# Patient Record
Sex: Female | Born: 1955 | Race: White | Hispanic: No | Marital: Married | Smoking: Current some day smoker
Health system: Southern US, Community
[De-identification: ages and names within clinical notes are randomized; demographics above are authoritative.]

## PROBLEM LIST (undated history)

## (undated) DIAGNOSIS — F32A Depression, unspecified: Secondary | ICD-10-CM

## (undated) DIAGNOSIS — M797 Fibromyalgia: Secondary | ICD-10-CM

## (undated) DIAGNOSIS — M1990 Unspecified osteoarthritis, unspecified site: Secondary | ICD-10-CM

## (undated) DIAGNOSIS — K5903 Drug induced constipation: Secondary | ICD-10-CM

## (undated) DIAGNOSIS — J40 Bronchitis, not specified as acute or chronic: Secondary | ICD-10-CM

## (undated) DIAGNOSIS — G2581 Restless legs syndrome: Secondary | ICD-10-CM

## (undated) DIAGNOSIS — R112 Nausea with vomiting, unspecified: Secondary | ICD-10-CM

## (undated) DIAGNOSIS — E7800 Pure hypercholesterolemia, unspecified: Secondary | ICD-10-CM

## (undated) DIAGNOSIS — Z9889 Other specified postprocedural states: Secondary | ICD-10-CM

## (undated) DIAGNOSIS — F419 Anxiety disorder, unspecified: Secondary | ICD-10-CM

## (undated) DIAGNOSIS — Z87442 Personal history of urinary calculi: Secondary | ICD-10-CM

## (undated) DIAGNOSIS — R600 Localized edema: Secondary | ICD-10-CM

## (undated) DIAGNOSIS — H04123 Dry eye syndrome of bilateral lacrimal glands: Secondary | ICD-10-CM

## (undated) HISTORY — PX: KNEE ARTHROSCOPY: SUR90

## (undated) HISTORY — PX: CARPAL TUNNEL RELEASE: SHX101

## (undated) HISTORY — PX: ANKLE FUSION: SHX881

## (undated) HISTORY — PX: ROTATOR CUFF REPAIR: SHX139

## (undated) HISTORY — PX: BACK SURGERY: SHX140

## (undated) HISTORY — PX: ABDOMINAL HYSTERECTOMY: SHX81

---

## 2004-12-11 ENCOUNTER — Encounter: Admission: RE | Admit: 2004-12-11 | Discharge: 2004-12-11 | Payer: Self-pay | Admitting: Neurosurgery

## 2005-04-21 ENCOUNTER — Inpatient Hospital Stay (HOSPITAL_COMMUNITY): Admission: RE | Admit: 2005-04-21 | Discharge: 2005-04-23 | Payer: Self-pay | Admitting: Neurosurgery

## 2006-04-17 ENCOUNTER — Encounter: Admission: RE | Admit: 2006-04-17 | Discharge: 2006-04-17 | Payer: Self-pay | Admitting: Orthopedic Surgery

## 2006-10-12 ENCOUNTER — Ambulatory Visit (HOSPITAL_COMMUNITY): Admission: RE | Admit: 2006-10-12 | Discharge: 2006-10-13 | Payer: Self-pay | Admitting: Neurosurgery

## 2007-06-02 ENCOUNTER — Inpatient Hospital Stay (HOSPITAL_COMMUNITY): Admission: RE | Admit: 2007-06-02 | Discharge: 2007-06-04 | Payer: Self-pay | Admitting: Orthopedic Surgery

## 2008-02-17 ENCOUNTER — Encounter: Admission: RE | Admit: 2008-02-17 | Discharge: 2008-02-17 | Payer: Self-pay | Admitting: Neurosurgery

## 2008-04-19 ENCOUNTER — Inpatient Hospital Stay (HOSPITAL_COMMUNITY): Admission: RE | Admit: 2008-04-19 | Discharge: 2008-04-21 | Payer: Self-pay | Admitting: Neurosurgery

## 2008-04-25 ENCOUNTER — Ambulatory Visit: Payer: Self-pay | Admitting: Surgery

## 2008-04-25 ENCOUNTER — Ambulatory Visit (HOSPITAL_COMMUNITY): Admission: RE | Admit: 2008-04-25 | Discharge: 2008-04-25 | Payer: Self-pay | Admitting: Neurosurgery

## 2008-04-25 ENCOUNTER — Encounter (INDEPENDENT_AMBULATORY_CARE_PROVIDER_SITE_OTHER): Payer: Self-pay | Admitting: Neurosurgery

## 2008-12-22 ENCOUNTER — Encounter: Admission: RE | Admit: 2008-12-22 | Discharge: 2008-12-22 | Payer: Self-pay | Admitting: Neurosurgery

## 2009-11-15 ENCOUNTER — Encounter: Admission: RE | Admit: 2009-11-15 | Discharge: 2009-11-15 | Payer: Self-pay | Admitting: Neurosurgery

## 2009-12-27 ENCOUNTER — Ambulatory Visit (HOSPITAL_COMMUNITY): Admission: RE | Admit: 2009-12-27 | Discharge: 2009-12-28 | Payer: Self-pay | Admitting: Neurosurgery

## 2010-05-15 ENCOUNTER — Ambulatory Visit (HOSPITAL_COMMUNITY): Admission: RE | Admit: 2010-05-15 | Discharge: 2010-05-15 | Payer: Self-pay | Admitting: Neurosurgery

## 2010-09-15 ENCOUNTER — Encounter: Payer: Self-pay | Admitting: Orthopedic Surgery

## 2010-11-12 LAB — CBC
HCT: 36.9 % (ref 36.0–46.0)
Hemoglobin: 12.7 g/dL (ref 12.0–15.0)
MCHC: 34.5 g/dL (ref 30.0–36.0)
MCV: 97.7 fL (ref 78.0–100.0)
Platelets: 172 10*3/uL (ref 150–400)
RBC: 3.78 MIL/uL — ABNORMAL LOW (ref 3.87–5.11)
RDW: 12.9 % (ref 11.5–15.5)
WBC: 7.6 10*3/uL (ref 4.0–10.5)

## 2010-11-12 LAB — BASIC METABOLIC PANEL
BUN: 14 mg/dL (ref 6–23)
CO2: 32 mEq/L (ref 19–32)
Calcium: 8.9 mg/dL (ref 8.4–10.5)
Chloride: 108 mEq/L (ref 96–112)
Creatinine, Ser: 0.75 mg/dL (ref 0.4–1.2)
GFR calc Af Amer: >60 mL/min (ref >60)
GFR calc non Af Amer: >60 mL/min (ref >60)
Glucose, Bld: 87 mg/dL (ref 70–99)
Potassium: 4.1 mEq/L (ref 3.5–5.1)
Sodium: 143 mEq/L (ref 135–145)

## 2010-11-12 LAB — SURGICAL PCR SCREEN
MRSA, PCR: NEGATIVE
Staphylococcus aureus: POSITIVE — AB

## 2011-01-07 NOTE — Op Note (Signed)
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   PROCEDURE:  Right tibial calcaneal fusion.   SURGEON:  Nadara Mustard, MD   ANESTHESIA:  Popliteal block plus LMA.   ESTIMATED BLOOD LOSS:  Minimal.   ANTIBIOTICS:  1 gram of Kefzol.   DRAINS:  None.   COMPLICATIONS:  None.   TOURNIQUET TIME:  Esmarch to the ankle for approximately 47 minutes.   DISPOSITION:  To PACU in stable condition.   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  Risks and benefits were discussed including infection,  neurovascular injury, nonhealing of bone, nonhealing of the skin, need  for additional surgery as well as DVT, pulmonary embolus.  The patient  states he understands and wished to proceed at this time.   DESCRIPTION OF PROCEDURE:  The patient was brought to OR room 10 after  undergoing a popliteal block.  She then underwent general LMA  anesthetic.  After adequate level of anesthesia obtained, the patient  was placed and a floppy lateral position supine on the operating table  and the right lower extremities prepped using DuraPrep and draped into a  sterile field.  A lateral incision was made over the fibula and the  distal aspect of the fibula was resected.  With the  foot held at 90  degrees of dorsiflexion.  The distal tibia and talar dome were resected  perpendicular to the longitudinal axis of the tibia.  There was  difficulty cutting through the talus due to the AVN of the talus.  Irrigation was used to minimize bony burning.  After the resections were  made, the alignment was confirmed.  An incision was made on the plantar  aspect and a guidewire was inserted from the calcaneus to the talus into  the tibia.  C-arm fluoroscopy verified reduction in both AP and lateral  planes.  The 17.5-mm drill bit was then used to over ream the guidewire.  A ball-tip guidewire was then inserted from the calcaneus to the talus  into the tibia and this was sequentially reamed to 11 mm for a 10 mm  nail.  The Synthes 10 x 180 mm nail was inserted using the posterior  alignment jig.  The guidewire and drills were used and a 65-mm spiral  blade was placed posteriorly.  The  guide was then switched to a lateral  position and a lateral screw was placed in the proximal aspect of the  nail 28 mm in length.  The wounds were irrigated with normal saline.  The jig was removed.  The distal locking screw was placed, a medial  incision was made and a medial malleolar excision was performed.  There  was a small cut in the posterior tibial tendon.  This was repaired using  2-0 FiberWire.  The wounds were irrigated with normal saline.  Subcu was  closed using Vicryl.  Skin was closed using Proximate staples.  C-arm  fluoroscopy radiographs were obtained for the final alignment which  showed a plantigrade foot at 90 degrees of dorsiflexion and neutral  varus and about 10 degrees of valgus of the hind foot.  The wounds were  covered with Adaptic orthopedic sponges, ABD dressing, Webril, Kerlix  and a Coban dressing.  Total Esmarch time approximately 47 minutes.  The  patient was extubated, taken to PACU in stable condition.      Nadara Mustard, MD  Electronically Signed      MVD/MEDQ  D:  06/02/2007  T:  06/02/2007  Job:  [id number]

## 2011-01-07 NOTE — Op Note (Signed)
NAMEBERNARD, DONAHOO NO.:  [id number]   MEDICAL RECORD NO.:  [id number]          PATIENT TYPE:  INP   LOCATION:  3036                         FACILITY:  [facility]   PHYSICIAN:  Hewitt Shorts, M.D.DATE OF BIRTH:  Sep 08, 1955   DATE OF PROCEDURE:  04/19/2008  DATE OF DISCHARGE:                               OPERATIVE REPORT   PREOPERATIVE DIAGNOSES:  1. Multilevel lumbar spondylosis.  2. Degenerative disk disease.  3. Neuroforaminal stenosis.  4. Degenerative scoliosis with resulting lumbar radiculopathy.   POSTOPERATIVE DIAGNOSES:  1. Multilevel lumbar spondylosis.  2. Degenerative disk disease.  3. Neuroforaminal stenosis.  4. Degenerative scoliosis with resulting lumbar radiculopathy.   PROCEDURE:  Bilateral L2-L3 and L3-L4 lumbar laminotomy, fasciectomy and  foraminotomy with decompression of the exiting L2, L3, and L4 nerve  roots bilaterally with microdissection, bilateral L2-L3 and L3-L4  posterior lumbar interbody fusion with AVS PEEK interbody implants and  mosaic with bone marrow aspirate, and bilateral L2 to L4 posterolateral  arthrodesis with radius post instrumentation and mosaic with bone marrow  aspirate.   SURGEON:  Hewitt Shorts, MD.   ASSISTANT:  1. Nelia Shi. Webb Silversmith, RN  2. Cristi Loron, MD.   ANESTHESIA:  General endotracheal.   INDICATIONS:  This is a 55 year old woman.  She had had 2 previous  lumbar surgeries by other surgeons.  Subsequently, she underwent a L4-L5  and L5-S1 lumbar decompression, PLIF and PLA from L4-S1 3 years ago.  She did well from that surgery, but developed progressive degeneration  at L2-L3 and L3-L4 levels with particular lateral recess and  neuroforaminal stenosis.  She developed degenerative scoliosis and she  was treated nonsurgically for past couple of years, but developed  increasing disabling pain and is admitted now for decompression and  arthrodesis at the L2-L3 and L3-L4 levels.   CT  had been done, which showed solid effusion at both the L4-L5 and L5-  S1. We had used 90D post instrumentation before and the plan was to  remove the screws at L5 and S1 leaving the 90D screw at L4 and using  radius screws and rods at the L2 and L3 levels.   PROCEDURE IN DETAIL:  The patient was brought to the operating room and  placed under general endotracheal anesthesia.  The patient was turned to  a prone position.  The lumbar region was prepped with Betadine soap  solution and draped in a sterile fashion.  The previous midline incision  was reopened and the incision was carried out rostrally as well as the  line of the incision was infiltrated with local anesthetic with  epinephrine.  Dissection was carried down through the subcutaneous  tissue to the lumbar fascia, which was incised bilaterally and the  paraspinal muscles were dissected from the spinous process and lamina in  a subperiosteal fashion.  Self-retaining retractors were placed.  An x-  ray was taken and the L2-L3, L3-L4 intralaminar space was identified.  Dissection was then carried out laterally and we exposed the post  instrumentation from L4-S1 and using a counter torque we unlocked  the  locking caps, 6 locking caps were removed.  We removed both rods, and we  removed the 90D screws from L5 and S1 and left 90D screws at L4.  Then  with magnification using microdissection and microsurgical technique  bilateral L2-L3 and L3-L4 lumbar laminotomies, facetectomies, and  foraminotomies were performed.  There was extensive spondylitic  overgrowth, and this was carefully removed and we were able to  decompress the thecal sac and exiting nerve roots bilaterally including  the L2, L3, and L4 nerve roots.  There was particular spondylitic  overgrowth encroaching the neural foramina. to the left side due to  asymmetric collapse of the disk spaces at L3-L4 causing greater collapse  to the left side and we were able to decompress  the exiting L3 nerve  root within the neuroforamen.  Bipolar cautery and Gelfoam with thrombin  were both used to maintain hemostasis and good hemostasis was maintained  throughout the procedure.  We exposed the transverse processes of L2,  L3, and L4.  They were later decorticated with the X Max drill.  Once  the decompression was completed bilaterally, we proceeded with the  interbody arthrodesis.   Diskectomy was begun bilaterally at both L2-L3 and L3-L4.  Thorough  diskectomy was performed using a variety of microcurettes and pituitary  rongeurs.  We then began to prepare the endplates for interbody  arthrodesis using paddle curettes to remove the cartilaginous surface,  and we mentioned the height of the intravertebral disk spaces and  selected 10-mm and height 0 degree of lordosis implants.  We then probed  the left L3 pedicle and bone marrow aspirate was injected over two 15 mL  drips of mosaic and then the mosaic was packed in the interbody  implants.  We placed the first implant on the left side at L2-L3,  however, because of the rotation the implant ended up essentially in the  midline and was not feasible to place a second implant, but we did pack  mosaic with bone marrow aspirate around the implant in the interbody  space.  Again at L3-L4, we placed the implant from the left side, again  it ended up essentially in the midline due to the scoliotic rotation and  we again packed mosaic with bone marrow aspirate around the implant  within the interbody space.  Once both implants were in place, we  proceeded with the posterolateral arthrodesis.   A C-arm fluoroscope was draped and brought into the field and using C-  arm fluoroscopic guidance, we identified pedicle entry sites bilaterally  at L2 and L3.  Each of the pedicles was probed and examined with a ball  probe.  Good bony surfaces were noted.  The L2 pedicles were tapped to  the 4.25 mm tap, the L3 pedicles were tapped to  the 5.25 mm tap.  We  placed 45-mm screws bilaterally at each level using 4.75 mm screws at  L2, 5.75 mm screws at L3.  Once all 4 screws were placed, we selected a  prelordosed rod.  We used a 70 mm rod on the left and an 80 mm on the  right.  They were placed within screw heads and locking caps were placed  on each of the 6 screws using radius locking caps at L2 and L3 and 90D  locking caps at L4.  Final tightening was done with the proper counter  torque and final tightener for each system.  We then packed the  remaining mosaic with  bone marrow aspirate in the lateral gutter over  the transverse processes and transverse space.  We again checked the  laminotomy defects to ensure that good hemostasis was present and none  of the bone graft material had fallen into the laminotomy  decompressions.  We then placed a medium radius cross-link between the  L3 and L4 screws and both locking collars were tightened after having  secured the cross-link to the rods bilaterally.  The wounds were then  closed in multiple layers.  The deep fascia was closed with interrupted  undyed #1 Vicryl sutures.  Scarpa fascia was closed with interrupted  undyed #1 Vicryl sutures.  The subcutaneous and subcuticular were closed  with interrupted inverted 2-0 Vicryl sutures.  The skin edges were  closed with surgical staples.  The wound was dressed with Adaptic and  sterile gauze.  The  procedure was tolerated well.  The estimated blood loss was 300 mL.  The  patient was given back 125 mL of cell saver blood.  Sponge and needle  count were correct.  Following the surgery, the patient was turned back  to the supine position, to be reversed from the anesthetic, extubated,  and transferred to the recovery room for further care.      Hewitt Shorts, M.D.  Electronically Signed     RWN/MEDQ  D:  04/19/2008  T:  04/20/2008  Job:  [id number]

## 2011-01-10 NOTE — Op Note (Signed)
NAME:  MAXWELL, MARTORANO NO.:  [id number]   MEDICAL RECORD NO.:  [id number]          PATIENT TYPE:  INP   LOCATION:  2858                         FACILITY:  [facility]   PHYSICIAN:  Hewitt Shorts, M.D.DATE OF BIRTH:  11-04-1955   DATE OF PROCEDURE:  04/22/2005  DATE OF DISCHARGE:                                 OPERATIVE REPORT   PREOP DIAGNOSIS:  Lumbar spondylosis, degenerative disease, degenerative  scoliosis, neural foraminal stenosis and radiculopathy.   POSTOPERATIVE DIAGNOSIS:  Lumbar spondylosis, degenerative disease,  degenerative scoliosis, neural foraminal stenosis and radiculopathy.   PROCEDURE:  L4 and L5 Gill procedure, L4-5 and L5-S1 post lumbar interbody  fusion with AVF Peak interbody implants and Vitoss with bone marrow aspirate  and L4-S1 posterolateral arthrodesis with 90D posterior instrumentation and  Vitoss with bone marrow aspirate with microdissection.   SURGEON:  Hewitt Shorts, M.D.   ASSISTANT:  Cristi Loron, M.D.   ANESTHESIA:  General tracheal.   INDICATIONS:  The patient is a 55 year old woman who presented with  bilateral lumbar radiculopathy, advanced spondylosis and degenerative disk  disease, worse at L4-5 and L5-S1 with significant neural foraminal stenosis  with more mild degenerative changes seen at L2-3 and L3-4.  The patient had  two previous lumbar surgeries, including and 94 and 2001 by other surgeons.  The records of those surgeries are unavailable. The patient has been treated  extensive nonsurgical measures for over six months without relief and is now  brought to surgery for decompression and arthrodesis.   PROCEDURE:  The patient was brought to the operating room and placed under  general endotracheal anesthesia. The patient was turned the patient the  prone position, lumbar region was prepped with Betadine soap and solution,  draped in sterile fashion. The midline was infiltrated with local  anesthetic  with epinephrine. Midline incision made extending through the previous  midline incision and carried down through the subcutaneous tissue. Bipolar  cautery and electrocautery were then used to maintain hemostasis. Dissection  was carried down to the lumbar fascia which was incised bilaterally. The  paraspinal muscles dissected from the spinous process and lamina in  subperiosteal fashion.  A localizing x-ray was taken and the L4-5 and L5-S1  interlaminar spaces identified. Self-retaining retractor was placed and then  bilateral laminotomies were performed at the L4-5 and L5-S1 levels.  Bilateral facetectomies were  performed at the L4-5 and L5-S1 levels and in  the end, we completed a right L5 hemilaminectomy.  The microscope was draped  and brought the field to provide additional magnification, illumination,  visualization and remainder of decompression performed using microdissection  microsurgical technique.   There was extensive scarring in the epidural space particularly on the right  at L4-5 and L5-S1.  This was carefully dissected and we identified the  annulus of the disk space bilaterally at each level.  At each level there  was significant calcification of the annulus and osteophytic overgrowth in  this was carefully removed. We able to enter into the disk space bilaterally  to proceed with thorough diskectomy using a variety  of pituitary rongeurs  and micro curettes. Posterior osteophytic overgrowth was removed using the X  Max drill, osteophyte removal tool and superior facetectomy was performed  for both L4-L5 to decompress the exiting nerve root within the neural  foramen.  The transverse processes of L4 and L5 were exposed bilaterally as  was the ala of S1.  The cartilaginous endplates of the corresponding  vertebra were carefully curetted to expose bony surface.  Then we measured  the height of the intervertebral disk space at each level and we selected 8  mm  interbody implants at L5-S1 and at the L4-5 level we used a 9 mm implant  on the right at 8 mm implant on the left.  We then probed the pedicles of L5  and S1 on the left side and aspirated bone marrow aspirate from the  vertebral body.  It was injected over a 10 mL strip of Vitoss.  The Vitoss  was then packed into the interbody implants and then carefully retracting  the thecal sac and nerve roots. The implants were gently tamped into  position, countersunk and then we packed additional Vitoss in the  intervertebral disk space around the implants.  We then brought the C-arm  fluoroscopic unit into the field and using C-arm fluoroscopic guidance, the  pedicle entry sites for L4 and on the right side at L5 and S1 were  identified and all six pedicles were probed with the bone probe, examined  the ball probe. No cutouts were found. Good bony density was found. We then  tapped each of the pedicles with a 5.25 tap and we placed 6.75 x 40 mm  screws bilaterally at L4 and L5 and 6.75 x 35 mm screws bilaterally at S1.  Once all six screws were placed and AP view showed good trapezoidal  trajectories and then we selected a 60 mm rod for left side, a 50 mm rod for  the right side.  Using a rod bender, it was gently contoured to the lumbar  lordosis and then locking caps were placed and final tightening was done of  all six locking caps.  The transverse process and ala were then decorticated  bilaterally and additional Vitoss and bone marrow aspirate was packed over  the transverse process and ala and intertransverse spaces.  The wound, which  had been irrigated numerous times throughout the procedure with bacitracin  solution, was examined once again, hemostasis was established, confirmed and  then we proceeded with closure. The deep fascia closed with interrupted  undyed 1 Vicryl sutures. The deep subcutaneous layer was closed interrupted inverted undyed 1 Vicryl sutures and the subcutaneous and  subcuticular layer  were closed with interrupted inverted 2-0 undyed Vicryl sutures. Skin edges  approximated with Dermabond and the wound was dressed with Adaptic and  sterile  gauze.  The procedure was tolerated well.  The estimated blood loss  was 400 mL. The patient was given back 200 mL of Cell Saver blood. Sponge  and needle count correct. Following surgery the patient was turned back to  supine position to be reversed from anesthetic, extubated, transferred to  the recovery room for further care.      Hewitt Shorts, M.D.  Electronically Signed     RWN/MEDQ  D:  04/21/2005  T:  04/22/2005  Job:  [id number]

## 2011-01-10 NOTE — Op Note (Signed)
NAME:  ELZABETH, MCQUERRY                 ACCOUNT NO.:  [id number]   MEDICAL RECORD NO.:  [id number]          PATIENT TYPE:  OIB   LOCATION:  3172                         FACILITY:  [facility]   PHYSICIAN:  Hewitt Shorts, M.D.DATE OF BIRTH:  1956/01/28   DATE OF PROCEDURE:  10/12/2006  DATE OF DISCHARGE:                               OPERATIVE REPORT   PREOPERATIVE DIAGNOSES:  C5-6 cervical disk herniation, cervical  spondylosis, cervical degenerative disk disease and cervical  radiculopathy.   POSTOPERATIVE DIAGNOSES:  C5-6 cervical disk herniation, cervical  spondylosis, cervical degenerative disk disease and cervical  radiculopathy.   PROCEDURES:  C5-6 anterior cervical diskectomy and arthrodesis with  allograft and tethered cervical plating.   SURGEON:  Hewitt Shorts, MD.   ASSISTANT:  Danae Orleans. Venetia Maxon, MD.  Nelia Shi. Webb Silversmith, RN.   ANESTHESIA:  General endotracheal.   INDICATIONS:  The patient is a 55 year old woman, who presented with a  right cervical radiculopathy.  She was found to have spondylytic disk  herniation and underlying degenerative disk disease at C5-6, with more  mild degenerative changes seen at C4-5 and C6-7.  She was symptomatic  from a right cervical radiculopathy, and the decision was made to  proceed with a single-level anterior cervical diskectomy and fusion.   DESCRIPTION OF PROCEDURES:  The patient was brought to the operating  room and placed under general endotracheal anesthesia.  The patient was  placed in 10 pounds of halter traction and the neck was prepped with  Betadine Soap and Solution and draped in a sterile fashion.  A  horizontal incision was made in the left side of the neck.  The line of  the incision was infiltrated with local anesthetic with epinephrine, and  then dissection was carried down through the subcutaneous tissue and  platysma.  Bipolar cautery was used to maintain hemostasis.  Dissection  was then carried out through an  avascular plane, leaving the  sternocleidomastoid and carotid artery and jugular vein laterally, and  the trachea and esophagus medially.  The ventral aspect of the vertebral  column was identified and a localizing x-ray taken and the C5-6  intervertebral disk space identified.  Diskectomy was then begun with  incision into the annulus and continued with microcurets and pituitary  rongeurs.  The cartilaginous endplates of the C5 and C6 vertebra were  using microcurets along with the X-Max drill.  The microscope was then  draped and brought into the field to provide additional magnification  and illumination and visualization, and the remainder of the  decompression was performed using microdissection and microsurgical  technique.  Posterior osteophytic overgrowth was removed using the X-Max  drill, along with a 2-mm Kerrison punch with a thin footplate, and then  we carefully removed the posterior longitudinal ligament, which was  thickened.  The spondylytic disk herniation was removed as well, and we  were able to decompress the spinal canal and the thecal sac.  Attention  was then turned to the neural foramina, where spondylytic disk  herniation was compressing the exiting C6 nerve roots, and this material  was  removed and the exiting C6 nerve roots decompressed.  Once the  decompression was completed, hemostasis was established, and then we  measured the height of the intervertebral disk space and selected an 8-  mm intervertebral graft.  The graft was hydrated in saline solution and  then positioned in the intervertebral disk space and countersunk.  We  then selected a 14-mm tethered cervical plate, and it was positioned  over the fusion construct and secured to each of the vertebra with a  pair of 4 x 14-mm variable-angle screws.  Each screw hole was drilled  and tapped, and the screws were placed in alternating fashion.  Once all  4 screws were placed, then final tightening was  performed.  The wound  was irrigated with Bacitracin solution, checked for hemostasis, which  was established and confirmed.  The cervical traction was discontinued  and an x-ray was taken, which showed the graft, plate and screws in good  position, and then we proceeded with closure.  The platysma was closed  with interrupted, inverted 2-0 undyed Vicryl sutures.  The subcutaneous  and subcuticular layers were closed with interrupted, inverted 3-0  undyed Vicryl sutures, and the skin edges were approximated with  Dermabond.  The procedure was tolerated well.  The estimated blood loss  was less than 25 mL.  Sponge counts were correct.  Following surgery,  the patient was reversed from the anesthetic, extubated and transferred  to the recovery room for further care, where she was noted to be moving  all 4 extremities to command.      Hewitt Shorts, M.D.  Electronically Signed     RWN/MEDQ  D:  10/12/2006  T:  10/13/2006  Job:  [id number]

## 2011-01-10 NOTE — H&P (Signed)
NAME:  Sonya Allen, Sonya Allen NO.:  [id number]   MEDICAL RECORD NO.:  [id number]          PATIENT TYPE:  INP   LOCATION:  NA                           FACILITY:  [facility]   PHYSICIAN:  Hewitt Shorts, M.D.DATE OF BIRTH:  1955-09-30   DATE OF ADMISSION:  04/21/2005  DATE OF DISCHARGE:                                HISTORY & PHYSICAL   HISTORY OF PRESENT ILLNESS:  Patient is a 55 year old right-handed white  female who has been suffering with bilateral lumbar radiculopathy.  She has  had two previous lumbar surgeries, the first by Dr. Ilda Basset in  December of 1984, and the second by Dr. Raynelle Dick in January of 2001.  She has been having ongoing difficulties with bilateral lumbar radicular  pain and has been treated extensively with medications including NSAIDs and  muscle relaxants.  She has been treated with physical therapy as well as a  series of spinal injections without relief.  Patient was evaluated with MRI  scan as well as with lumbar myelogram and post myelogram CT scan.  Studies  show extensive spondylosis and degenerative disc disease as well as post  surgical changes, worse at the L4-5 and L5-S1 level, although more mild  degenerative changes are also seen at L2-3 and L3-4.  There is advanced  degenerative disc disease, facet arthropathy, degenerative scoliosis and  neural foraminal encroachment bilaterally at L4-5 and L5-S1.   We discussed the possibility of surgical decompression and arthrodesis,  however, she understands the extensive nature of the procedure.  It was  discussed with her, risks of surgery on numerous occasions and we discussed  the uncertainty of clinical improvement on numerous occasions.  However,  after extensive nonsurgical measures, patient continues to suffer with  radiculopathy and wishes to proceed with decompression and arthrodesis and  is admitted for such.   ALLERGIES:  No known drug allergies but describes an  intolerance to CODEINE  which causes nausea.   CURRENT MEDICATIONS:  Celebrex and Vicodin.   PAST MEDICAL HISTORY:  There is no history of hypertension, myocardial  infarction, cancer, stroke, diabetes, peptic ulcer disease or lung disease.   PAST SURGICAL HISTORY:  1.  Two lumbar surgeries as described above.  2.  Hysterectomy in March of 2000.   FAMILY HISTORY:  Her mother died at age 23.  She had breast cancer.  Father  is in fair health at age 55 with hypertension and heart disease.   SOCIAL HISTORY:  Patient works as a Comptroller at [employer].  She smokes half a pack a day.  She has been smoking for over 20  years.  She does not drink alcoholic beverages.  She denies history of  substance abuse.   REVIEW OF SYSTEMS:  Notable for as described in history of present illness  and past medical history.  She has had a history of anemia as well as kidney  stones but the review of systems is otherwise unremarkable.   PHYSICAL EXAMINATION:  GENERAL APPEARANCE:  Patient is a well-developed,  well-nourished white female in no acute  distress.  VITAL SIGNS:  Temperature 97.6, pulse 60, blood pressure 105/60.  Height 5  feet 8 inches, weight 164 pounds.  LUNGS:  Clear to auscultation. She has symmetrical excursion.  CARDIOVASCULAR:  Regular rate and rhythm with S1 and S2.  There is no  murmur.  ABDOMEN:  Soft, nondistended.  Bowel sounds are present.  EXTREMITIES:  No clubbing, cyanosis, or edema.  MUSCULOSKELETAL:  No tenderness to palpation over the lumbar spinous process  or paralumbar musculature disease.  Her mobility though is significantly  limited.  She has discomfort with forward flexion as well as with extension.  Straight leg raising is negative bilaterally.  NEUROLOGIC:  There is 5/5 strength in dorsiflexors, plantar flexors and  extensor hallucis longus bilaterally.  Sensation is somewhat more intense in  the left than the right side.  Reflexes 1 at  the quadriceps,  minimal at the  gastrocnemius, symmetrical bilaterally.  Toes are downgoing bilaterally.  She has normal gait and stance.   IMPRESSION:  Patient has extensive multilevel degenerative disc disease,  spondylosis, degenerative scoliosis and neural foraminal stenosis with  bilateral lumbar radiculopathy with the worse degenerative changes being  seen at L4-5 and L5-S1, lesser degenerative changes in L2-3 and L3-4.   PLAN:  Patient will be admitted for an L4 and L5 Gill procedure, an L4-5 and  L5-S1 posterior lumbar interbody fusion with interbody implants and bone  graft and L4 to S1 posterolateral arthrodesis with posterior instrumentation  and bone graft.   I discussed on numerous occasions extensive surgery, hospital stay and  overall recuperation, need for postoperative immobilization and lumbar  corset, the plan to use Cell Saver during extensive surgery and risks to  include risk of infection, bleeding and possible need for transfusion, risks  of nerve root dysfunction with pain, weakness, numbness, or paresthesias,  risk of dural tear and CSF leak, possible need for further surgery, the risk  of failure of the arthrodesis particularly in light of her smoking history,  and anesthetic risks of myocardial infarction, stroke, pneumonia and death,  again all of which are increased due to her smoking history.  She also  understands the uncertainty of clinical improvement with surgical  decompression and arthrodesis.   Understanding all of this, she does wish to proceed with surgery and is  admitted for such.      Hewitt Shorts, M.D.  Electronically Signed     RWN/MEDQ  D:  04/21/2005  T:  04/21/2005  Job:  [id number]

## 2011-06-05 LAB — CBC
HCT: 39.3
Hemoglobin: 13.4
MCHC: 34.2
MCV: 95.4
Platelets: 232
RBC: 4.12
RDW: 14
WBC: 9.2

## 2011-06-05 LAB — BASIC METABOLIC PANEL
BUN: 8
CO2: 29
Calcium: 9.5
Chloride: 106
Creatinine, Ser: 0.63
GFR calc Af Amer: >60
GFR calc non Af Amer: >60
Glucose, Bld: 90
Potassium: 4.3
Sodium: 142

## 2013-01-31 DIAGNOSIS — Z9889 Other specified postprocedural states: Secondary | ICD-10-CM

## 2013-01-31 HISTORY — DX: Other specified postprocedural states: Z98.890

## 2013-04-15 DIAGNOSIS — M546 Pain in thoracic spine: Secondary | ICD-10-CM

## 2013-04-15 DIAGNOSIS — M5416 Radiculopathy, lumbar region: Secondary | ICD-10-CM | POA: Insufficient documentation

## 2013-04-15 DIAGNOSIS — M545 Low back pain, unspecified: Secondary | ICD-10-CM

## 2013-04-15 HISTORY — DX: Radiculopathy, lumbar region: M54.16

## 2013-04-15 HISTORY — DX: Low back pain, unspecified: M54.50

## 2013-04-15 HISTORY — DX: Pain in thoracic spine: M54.6

## 2013-05-26 DIAGNOSIS — M1711 Unilateral primary osteoarthritis, right knee: Secondary | ICD-10-CM

## 2013-05-26 HISTORY — DX: Unilateral primary osteoarthritis, right knee: M17.11

## 2013-07-07 ENCOUNTER — Other Ambulatory Visit: Payer: Self-pay | Admitting: Orthopedic Surgery

## 2013-07-08 ENCOUNTER — Encounter (HOSPITAL_COMMUNITY): Payer: Self-pay | Admitting: Pharmacy Technician

## 2013-07-14 NOTE — Pre-Procedure Instructions (Signed)
Sonya Allen  07/14/2013   Your procedure is scheduled on:  Monday, July 25, 2013  Report to [facility] Short Stay (use Main Entrance "A'') at 6:45 AM.  Call this number if you have problems the morning of surgery: [phone]   Remember:   Do not eat food or drink liquids after midnight.   Take these medicines the morning of surgery with A SIP OF WATER: Morphine (MS Contin), Oxycodone if needed for breakthrough pain, Pregabalin (Lyrica), and venlafaxine XR (EFFEXOR-XR) 150 MG 24 hr capsule  Stop taking Aspirin, vitamins, and herbal medications (fish oil-omega-3 fatty acids 1000 MG capsule)  Do not take any NSAIDs ie: Ibuprofen, Advil, Naproxen or any medication containing Aspirin.    Do not wear jewelry, make-up or nail polish.  Do not wear lotions, powders, or perfumes. You may wear deodorant.  Do not shave 48 hours prior to surgery  Do not bring valuables to the hospital.  [facility] is not responsible for any belongings or valuables.               Contacts, dentures or bridgework may not be worn into surgery.  Leave suitcase in the car. After surgery it may be brought to your room.  For patients admitted to the hospital, discharge time is determined by your treatment team.               Patients discharged the day of surgery will not be allowed to drive home.  Name and phone number of your driver:   Special Instructions: Shower using CHG 2 nights before surgery and the night before surgery.  If you shower the day of surgery use CHG.  Use special wash - you have one bottle of CHG for all showers.  You should use approximately 1/3 of the bottle for each shower.   Please read over the following fact sheets that you were given: Pain Booklet, Coughing and Deep Breathing, Blood Transfusion Information, Total Joint Packet, MRSA Information and Surgical Site Infection Prevention

## 2013-07-15 ENCOUNTER — Encounter (HOSPITAL_COMMUNITY)
Admission: RE | Admit: 2013-07-15 | Discharge: 2013-07-15 | Disposition: A | Discharge status: Home / Self Care | Payer: Managed Care, Other (non HMO) | Source: Ambulatory Visit | Attending: Orthopedic Surgery | Admitting: Orthopedic Surgery

## 2013-07-15 ENCOUNTER — Encounter (HOSPITAL_COMMUNITY): Payer: Self-pay

## 2013-07-15 DIAGNOSIS — Z01818 Encounter for other preprocedural examination: Secondary | ICD-10-CM | POA: Insufficient documentation

## 2013-07-15 DIAGNOSIS — Z01810 Encounter for preprocedural cardiovascular examination: Secondary | ICD-10-CM | POA: Insufficient documentation

## 2013-07-15 DIAGNOSIS — Z01812 Encounter for preprocedural laboratory examination: Secondary | ICD-10-CM | POA: Insufficient documentation

## 2013-07-15 HISTORY — DX: Drug induced constipation: K59.03

## 2013-07-15 HISTORY — DX: Nausea with vomiting, unspecified: R11.2

## 2013-07-15 HISTORY — DX: Dry eye syndrome of bilateral lacrimal glands: H04.123

## 2013-07-15 HISTORY — DX: Personal history of urinary calculi: Z87.442

## 2013-07-15 HISTORY — DX: Bronchitis, not specified as acute or chronic: J40

## 2013-07-15 HISTORY — DX: Other specified postprocedural states: Z98.890

## 2013-07-15 HISTORY — DX: Pure hypercholesterolemia, unspecified: E78.00

## 2013-07-15 HISTORY — DX: Fibromyalgia: M79.7

## 2013-07-15 HISTORY — DX: Unspecified osteoarthritis, unspecified site: M19.90

## 2013-07-15 HISTORY — DX: Localized edema: R60.0

## 2013-07-15 HISTORY — DX: Restless legs syndrome: G25.81

## 2013-07-15 LAB — SURGICAL PCR SCREEN
MRSA, PCR: NEGATIVE
Staphylococcus aureus: POSITIVE — AB

## 2013-07-15 LAB — COMPREHENSIVE METABOLIC PANEL
ALT: 8 U/L (ref 0–35)
AST: 14 U/L (ref 0–37)
Albumin: 4.1 g/dL (ref 3.5–5.2)
Alkaline Phosphatase: 73 U/L (ref 39–117)
BUN: 12 mg/dL (ref 6–23)
CO2: 31 mEq/L (ref 19–32)
Calcium: 8.8 mg/dL (ref 8.4–10.5)
Chloride: 100 mEq/L (ref 96–112)
Creatinine, Ser: 0.77 mg/dL (ref 0.50–1.10)
GFR calc Af Amer: >90 mL/min (ref >90)
GFR calc non Af Amer: >90 mL/min (ref >90)
Glucose, Bld: 93 mg/dL (ref 70–99)
Potassium: 4.1 mEq/L (ref 3.5–5.1)
Sodium: 139 mEq/L (ref 135–145)
Total Bilirubin: 0.1 mg/dL — ABNORMAL LOW (ref 0.3–1.2)
Total Protein: 7.1 g/dL (ref 6.0–8.3)

## 2013-07-15 LAB — CBC WITH DIFFERENTIAL/PLATELET
Basophils Absolute: 0 10*3/uL (ref 0.0–0.1)
Basophils Relative: 0 % (ref 0–1)
Eosinophils Absolute: 0.4 10*3/uL (ref 0.0–0.7)
Eosinophils Relative: 5 % (ref 0–5)
HCT: 38.7 % (ref 36.0–46.0)
Hemoglobin: 13.3 g/dL (ref 12.0–15.0)
Lymphocytes Relative: 39 % (ref 12–46)
Lymphs Abs: 2.8 10*3/uL (ref 0.7–4.0)
MCH: 32.4 pg (ref 26.0–34.0)
MCHC: 34.4 g/dL (ref 30.0–36.0)
MCV: 94.4 fL (ref 78.0–100.0)
Monocytes Absolute: 0.5 10*3/uL (ref 0.1–1.0)
Monocytes Relative: 7 % (ref 3–12)
Neutro Abs: 3.5 10*3/uL (ref 1.7–7.7)
Neutrophils Relative %: 49 % (ref 43–77)
Platelets: 199 10*3/uL (ref 150–400)
RBC: 4.1 MIL/uL (ref 3.87–5.11)
RDW: 12.5 % (ref 11.5–15.5)
WBC: 7.2 10*3/uL (ref 4.0–10.5)

## 2013-07-15 LAB — URINALYSIS, ROUTINE W REFLEX MICROSCOPIC
Bilirubin Urine: NEGATIVE
Glucose, UA: NEGATIVE mg/dL
Ketones, ur: NEGATIVE mg/dL
Leukocytes, UA: NEGATIVE
Nitrite: NEGATIVE
Protein, ur: NEGATIVE mg/dL
Specific Gravity, Urine: 1.013 (ref 1.005–1.030)
Urobilinogen, UA: 0.2 mg/dL (ref 0.0–1.0)
pH: 5 (ref 5.0–8.0)

## 2013-07-15 LAB — TYPE AND SCREEN
ABO/RH(D): A POS
Antibody Screen: NEGATIVE

## 2013-07-15 LAB — URINE MICROSCOPIC-ADD ON

## 2013-07-15 LAB — PROTIME-INR
INR: 1.04 (ref 0.00–1.49)
Prothrombin Time: 13.4 seconds (ref 11.6–15.2)

## 2013-07-15 LAB — APTT: aPTT: 30 seconds (ref 24–37)

## 2013-07-15 NOTE — Progress Notes (Signed)
Patient denied having any cardiac or pulmonary issues. PCP is Dr. Philemon Kingdom in [place], [place].

## 2013-07-16 LAB — URINE CULTURE: Colony Count: 30000

## 2013-07-24 MED ORDER — CEFAZOLIN SODIUM-DEXTROSE 2-3 GM-% IV SOLR
2.0000 g | INTRAVENOUS | Status: AC
  Administered 2013-07-25: 2 g via INTRAVENOUS
  Filled 2013-07-24: qty 50

## 2013-07-24 MED ORDER — TRANEXAMIC ACID 100 MG/ML IV SOLN
1000.0000 mg | INTRAVENOUS | Status: AC
  Administered 2013-07-25: 1000 mg via INTRAVENOUS
  Filled 2013-07-24: qty 10

## 2013-07-25 ENCOUNTER — Inpatient Hospital Stay (HOSPITAL_COMMUNITY)
Admission: RE | Admit: 2013-07-25 | Discharge: 2013-07-28 | DRG: 470 | Disposition: A | Discharge status: Home Health | Payer: Managed Care, Other (non HMO) | Source: Ambulatory Visit | Attending: Orthopedic Surgery | Admitting: Orthopedic Surgery

## 2013-07-25 ENCOUNTER — Encounter (HOSPITAL_COMMUNITY)
Admission: RE | Disposition: A | Discharge status: Home Health | Payer: Self-pay | Source: Ambulatory Visit | Attending: Orthopedic Surgery

## 2013-07-25 ENCOUNTER — Encounter (HOSPITAL_COMMUNITY): Payer: Self-pay | Admitting: *Deleted

## 2013-07-25 ENCOUNTER — Encounter (HOSPITAL_COMMUNITY): Payer: Managed Care, Other (non HMO) | Admitting: Anesthesiology

## 2013-07-25 ENCOUNTER — Inpatient Hospital Stay (HOSPITAL_COMMUNITY): Payer: Managed Care, Other (non HMO) | Admitting: Anesthesiology

## 2013-07-25 DIAGNOSIS — F17200 Nicotine dependence, unspecified, uncomplicated: Secondary | ICD-10-CM | POA: Diagnosis present

## 2013-07-25 DIAGNOSIS — Z96659 Presence of unspecified artificial knee joint: Secondary | ICD-10-CM

## 2013-07-25 DIAGNOSIS — IMO0001 Reserved for inherently not codable concepts without codable children: Secondary | ICD-10-CM | POA: Diagnosis present

## 2013-07-25 DIAGNOSIS — G2581 Restless legs syndrome: Secondary | ICD-10-CM | POA: Diagnosis present

## 2013-07-25 DIAGNOSIS — D62 Acute posthemorrhagic anemia: Secondary | ICD-10-CM | POA: Diagnosis not present

## 2013-07-25 DIAGNOSIS — Z79899 Other long term (current) drug therapy: Secondary | ICD-10-CM

## 2013-07-25 DIAGNOSIS — E7800 Pure hypercholesterolemia, unspecified: Secondary | ICD-10-CM | POA: Diagnosis present

## 2013-07-25 DIAGNOSIS — Z23 Encounter for immunization: Secondary | ICD-10-CM

## 2013-07-25 DIAGNOSIS — M1710 Unilateral primary osteoarthritis, unspecified knee: Principal | ICD-10-CM | POA: Diagnosis present

## 2013-07-25 DIAGNOSIS — Z87442 Personal history of urinary calculi: Secondary | ICD-10-CM

## 2013-07-25 DIAGNOSIS — Z7901 Long term (current) use of anticoagulants: Secondary | ICD-10-CM

## 2013-07-25 DIAGNOSIS — Z96652 Presence of left artificial knee joint: Secondary | ICD-10-CM

## 2013-07-25 DIAGNOSIS — H04129 Dry eye syndrome of unspecified lacrimal gland: Secondary | ICD-10-CM | POA: Diagnosis present

## 2013-07-25 HISTORY — PX: TOTAL KNEE ARTHROPLASTY: SHX125

## 2013-07-25 HISTORY — DX: Presence of unspecified artificial knee joint: Z96.659

## 2013-07-25 LAB — CBC
HCT: 34 % — ABNORMAL LOW (ref 36.0–46.0)
Hemoglobin: 11.9 g/dL — ABNORMAL LOW (ref 12.0–15.0)
MCH: 32.6 pg (ref 26.0–34.0)
MCHC: 35 g/dL (ref 30.0–36.0)
MCV: 93.2 fL (ref 78.0–100.0)
Platelets: 149 10*3/uL — ABNORMAL LOW (ref 150–400)
RBC: 3.65 MIL/uL — ABNORMAL LOW (ref 3.87–5.11)
RDW: 12.4 % (ref 11.5–15.5)
WBC: 8.3 10*3/uL (ref 4.0–10.5)

## 2013-07-25 LAB — CREATININE, SERUM
Creatinine, Ser: 0.59 mg/dL (ref 0.50–1.10)
GFR calc Af Amer: >90 mL/min (ref >90)
GFR calc non Af Amer: >90 mL/min (ref >90)

## 2013-07-25 SURGERY — ARTHROPLASTY, KNEE, TOTAL
Anesthesia: General | Site: Knee | Laterality: Left | Wound class: Clean

## 2013-07-25 MED ORDER — EPHEDRINE SULFATE 50 MG/ML IJ SOLN
INTRAMUSCULAR | Status: DC | PRN
  Administered 2013-07-25: 10 mg via INTRAVENOUS

## 2013-07-25 MED ORDER — HYDROMORPHONE HCL PF 1 MG/ML IJ SOLN
INTRAMUSCULAR | Status: AC
  Filled 2013-07-25: qty 1

## 2013-07-25 MED ORDER — SODIUM CHLORIDE 0.9 % IV SOLN: INTRAVENOUS | Status: DC

## 2013-07-25 MED ORDER — PHENOL 1.4 % MT LIQD: 1.0000 | OROMUCOSAL | Status: DC | PRN

## 2013-07-25 MED ORDER — CEFAZOLIN SODIUM-DEXTROSE 2-3 GM-% IV SOLR
2.0000 g | Freq: Four times a day (QID) | INTRAVENOUS | Status: AC
  Administered 2013-07-25 (×2): 2 g via INTRAVENOUS
  Filled 2013-07-25 (×3): qty 50

## 2013-07-25 MED ORDER — DIPHENHYDRAMINE HCL 12.5 MG/5ML PO ELIX: 12.5000 mg | ORAL_SOLUTION | ORAL | Status: DC | PRN

## 2013-07-25 MED ORDER — DEXTROSE 5 % IV SOLN
500.0000 mg | Freq: Four times a day (QID) | INTRAVENOUS | Status: DC | PRN
  Filled 2013-07-25: qty 5

## 2013-07-25 MED ORDER — OXYCODONE HCL 5 MG/5ML PO SOLN: 5.0000 mg | Freq: Once | ORAL | Status: AC | PRN

## 2013-07-25 MED ORDER — SODIUM CHLORIDE 0.9 % IR SOLN
Status: DC | PRN
  Administered 2013-07-25: 3000 mL

## 2013-07-25 MED ORDER — FUROSEMIDE 20 MG PO TABS
20.0000 mg | ORAL_TABLET | Freq: Every day | ORAL | Status: DC
  Administered 2013-07-25 – 2013-07-28 (×4): 20 mg via ORAL
  Filled 2013-07-25 (×4): qty 1

## 2013-07-25 MED ORDER — ENOXAPARIN SODIUM 30 MG/0.3ML ~~LOC~~ SOLN
30.0000 mg | Freq: Two times a day (BID) | SUBCUTANEOUS | Status: DC
  Administered 2013-07-26 – 2013-07-28 (×5): 30 mg via SUBCUTANEOUS
  Filled 2013-07-25 (×7): qty 0.3

## 2013-07-25 MED ORDER — METHOCARBAMOL 500 MG PO TABS
500.0000 mg | ORAL_TABLET | Freq: Four times a day (QID) | ORAL | Status: DC | PRN
  Administered 2013-07-25 – 2013-07-28 (×7): 500 mg via ORAL
  Filled 2013-07-25 (×8): qty 1

## 2013-07-25 MED ORDER — MORPHINE SULFATE 4 MG/ML IJ SOLN
4.0000 mg | INTRAMUSCULAR | Status: DC | PRN
  Administered 2013-07-25 – 2013-07-27 (×12): 4 mg via INTRAVENOUS
  Filled 2013-07-25 (×12): qty 1

## 2013-07-25 MED ORDER — LIDOCAINE HCL (CARDIAC) 20 MG/ML IV SOLN
INTRAVENOUS | Status: DC | PRN
  Administered 2013-07-25: 80 mg via INTRAVENOUS

## 2013-07-25 MED ORDER — METOCLOPRAMIDE HCL 5 MG/ML IJ SOLN: 5.0000 mg | Freq: Three times a day (TID) | INTRAMUSCULAR | Status: DC | PRN

## 2013-07-25 MED ORDER — ONDANSETRON HCL 4 MG/2ML IJ SOLN
INTRAMUSCULAR | Status: DC | PRN
  Administered 2013-07-25: 4 mg via INTRAVENOUS

## 2013-07-25 MED ORDER — OXYCODONE HCL 5 MG PO TABS
ORAL_TABLET | ORAL | Status: AC
  Filled 2013-07-25: qty 7

## 2013-07-25 MED ORDER — FENTANYL CITRATE 0.05 MG/ML IJ SOLN
INTRAMUSCULAR | Status: AC
  Filled 2013-07-25: qty 2

## 2013-07-25 MED ORDER — MIDAZOLAM HCL 2 MG/2ML IJ SOLN
INTRAMUSCULAR | Status: AC
  Administered 2013-07-25: 2 mg
  Filled 2013-07-25: qty 2

## 2013-07-25 MED ORDER — ONDANSETRON HCL 4 MG/2ML IJ SOLN: 4.0000 mg | Freq: Once | INTRAMUSCULAR | Status: DC | PRN

## 2013-07-25 MED ORDER — BUPIVACAINE LIPOSOME 1.3 % IJ SUSP
20.0000 mL | Freq: Once | INTRAMUSCULAR | Status: DC
  Filled 2013-07-25: qty 20

## 2013-07-25 MED ORDER — PROPOFOL 10 MG/ML IV BOLUS
INTRAVENOUS | Status: DC | PRN
  Administered 2013-07-25: 150 mg via INTRAVENOUS

## 2013-07-25 MED ORDER — MORPHINE SULFATE ER 15 MG PO TBCR
60.0000 mg | EXTENDED_RELEASE_TABLET | Freq: Two times a day (BID) | ORAL | Status: DC
  Administered 2013-07-25 – 2013-07-28 (×6): 60 mg via ORAL
  Filled 2013-07-25 (×6): qty 4

## 2013-07-25 MED ORDER — PNEUMOCOCCAL VAC POLYVALENT 25 MCG/0.5ML IJ INJ
0.5000 mL | INJECTION | INTRAMUSCULAR | Status: AC
  Administered 2013-07-26: 0.5 mL via INTRAMUSCULAR
  Filled 2013-07-25: qty 0.5

## 2013-07-25 MED ORDER — OXYCODONE HCL 5 MG PO TABS
30.0000 mg | ORAL_TABLET | Freq: Three times a day (TID) | ORAL | Status: DC | PRN
  Administered 2013-07-25 – 2013-07-28 (×6): 30 mg via ORAL
  Filled 2013-07-25 (×5): qty 6

## 2013-07-25 MED ORDER — CARISOPRODOL 350 MG PO TABS
350.0000 mg | ORAL_TABLET | Freq: Three times a day (TID) | ORAL | Status: DC
  Administered 2013-07-25 – 2013-07-28 (×9): 350 mg via ORAL
  Filled 2013-07-25 (×9): qty 1

## 2013-07-25 MED ORDER — METOCLOPRAMIDE HCL 10 MG PO TABS: 5.0000 mg | ORAL_TABLET | Freq: Three times a day (TID) | ORAL | Status: DC | PRN

## 2013-07-25 MED ORDER — ONDANSETRON HCL 4 MG/2ML IJ SOLN: 4.0000 mg | Freq: Four times a day (QID) | INTRAMUSCULAR | Status: DC | PRN

## 2013-07-25 MED ORDER — HYDROMORPHONE HCL PF 1 MG/ML IJ SOLN
0.2500 mg | INTRAMUSCULAR | Status: DC | PRN
  Administered 2013-07-25 (×2): 0.5 mg via INTRAVENOUS
  Administered 2013-07-25: 1 mg via INTRAVENOUS

## 2013-07-25 MED ORDER — CLONAZEPAM 0.5 MG PO TABS
0.5000 mg | ORAL_TABLET | Freq: Every day | ORAL | Status: DC
  Administered 2013-07-25 – 2013-07-27 (×3): 0.5 mg via ORAL
  Filled 2013-07-25 (×3): qty 1

## 2013-07-25 MED ORDER — MENTHOL 3 MG MT LOZG: 1.0000 | LOZENGE | OROMUCOSAL | Status: DC | PRN

## 2013-07-25 MED ORDER — ACETAMINOPHEN 650 MG RE SUPP: 650.0000 mg | Freq: Four times a day (QID) | RECTAL | Status: DC | PRN

## 2013-07-25 MED ORDER — SENNOSIDES-DOCUSATE SODIUM 8.6-50 MG PO TABS: 1.0000 | ORAL_TABLET | Freq: Every evening | ORAL | Status: DC | PRN

## 2013-07-25 MED ORDER — OXYCODONE HCL 5 MG PO TABS
5.0000 mg | ORAL_TABLET | Freq: Once | ORAL | Status: AC | PRN
  Administered 2013-07-25: 5 mg via ORAL

## 2013-07-25 MED ORDER — FENTANYL CITRATE 0.05 MG/ML IJ SOLN
100.0000 ug | Freq: Once | INTRAMUSCULAR | Status: AC
  Administered 2013-07-25: 100 ug via INTRAVENOUS

## 2013-07-25 MED ORDER — BUPIVACAINE-EPINEPHRINE PF 0.25-1:200000 % IJ SOLN
INTRAMUSCULAR | Status: DC | PRN
  Administered 2013-07-25: 30 mL

## 2013-07-25 MED ORDER — MEPERIDINE HCL 25 MG/ML IJ SOLN: 6.2500 mg | INTRAMUSCULAR | Status: DC | PRN

## 2013-07-25 MED ORDER — FENTANYL CITRATE 0.05 MG/ML IJ SOLN
INTRAMUSCULAR | Status: DC | PRN
  Administered 2013-07-25 (×2): 100 ug via INTRAVENOUS

## 2013-07-25 MED ORDER — ALUM & MAG HYDROXIDE-SIMETH 200-200-20 MG/5ML PO SUSP: 30.0000 mL | ORAL | Status: DC | PRN

## 2013-07-25 MED ORDER — MIDAZOLAM HCL 5 MG/ML IJ SOLN: 2.0000 mg | Freq: Once | INTRAMUSCULAR | Status: DC

## 2013-07-25 MED ORDER — ACETAMINOPHEN 325 MG PO TABS
650.0000 mg | ORAL_TABLET | Freq: Four times a day (QID) | ORAL | Status: DC | PRN
  Administered 2013-07-26: 650 mg via ORAL
  Filled 2013-07-25: qty 2

## 2013-07-25 MED ORDER — CHLORHEXIDINE GLUCONATE 4 % EX LIQD: 60.0000 mL | Freq: Once | CUTANEOUS | Status: DC

## 2013-07-25 MED ORDER — VANCOMYCIN HCL IN DEXTROSE 1-5 GM/200ML-% IV SOLN: 1000.0000 mg | Freq: Two times a day (BID) | INTRAVENOUS | Status: DC

## 2013-07-25 MED ORDER — DOCUSATE SODIUM 100 MG PO CAPS
100.0000 mg | ORAL_CAPSULE | Freq: Two times a day (BID) | ORAL | Status: DC
  Administered 2013-07-25 – 2013-07-28 (×6): 100 mg via ORAL
  Filled 2013-07-25 (×6): qty 1

## 2013-07-25 MED ORDER — SODIUM CHLORIDE 0.9 % IV SOLN
INTRAVENOUS | Status: DC
  Administered 2013-07-25 – 2013-07-26 (×2): via INTRAVENOUS

## 2013-07-25 MED ORDER — BUPIVACAINE-EPINEPHRINE (PF) 0.25% -1:200000 IJ SOLN
INTRAMUSCULAR | Status: AC
  Filled 2013-07-25: qty 30

## 2013-07-25 MED ORDER — ZOLPIDEM TARTRATE 5 MG PO TABS: 5.0000 mg | ORAL_TABLET | Freq: Every evening | ORAL | Status: DC | PRN

## 2013-07-25 MED ORDER — INFLUENZA VAC SPLIT QUAD 0.5 ML IM SUSP
0.5000 mL | INTRAMUSCULAR | Status: AC
  Administered 2013-07-26: 0.5 mL via INTRAMUSCULAR
  Filled 2013-07-25: qty 0.5

## 2013-07-25 MED ORDER — BISACODYL 5 MG PO TBEC
5.0000 mg | DELAYED_RELEASE_TABLET | Freq: Every day | ORAL | Status: DC | PRN
  Administered 2013-07-28: 5 mg via ORAL
  Filled 2013-07-25: qty 1

## 2013-07-25 MED ORDER — CELECOXIB 200 MG PO CAPS
200.0000 mg | ORAL_CAPSULE | Freq: Two times a day (BID) | ORAL | Status: DC
  Administered 2013-07-26 – 2013-07-28 (×5): 200 mg via ORAL
  Filled 2013-07-25 (×9): qty 1

## 2013-07-25 MED ORDER — ONDANSETRON HCL 4 MG PO TABS: 4.0000 mg | ORAL_TABLET | Freq: Four times a day (QID) | ORAL | Status: DC | PRN

## 2013-07-25 MED ORDER — DEXAMETHASONE SODIUM PHOSPHATE 4 MG/ML IJ SOLN
INTRAMUSCULAR | Status: DC | PRN
  Administered 2013-07-25: 4 mg via INTRAVENOUS

## 2013-07-25 MED ORDER — LACTATED RINGERS IV SOLN
INTRAVENOUS | Status: DC
  Administered 2013-07-25 (×2): via INTRAVENOUS

## 2013-07-25 MED ORDER — FLEET ENEMA 7-19 GM/118ML RE ENEM: 1.0000 | ENEMA | Freq: Once | RECTAL | Status: AC | PRN

## 2013-07-25 MED ORDER — VENLAFAXINE HCL ER 150 MG PO CP24
150.0000 mg | ORAL_CAPSULE | Freq: Every day | ORAL | Status: DC
  Administered 2013-07-25 – 2013-07-28 (×4): 150 mg via ORAL
  Filled 2013-07-25 (×4): qty 1

## 2013-07-25 MED ORDER — BUPIVACAINE LIPOSOME 1.3 % IJ SUSP
INTRAMUSCULAR | Status: DC | PRN
  Administered 2013-07-25: 20 mL

## 2013-07-25 MED ORDER — PREGABALIN 50 MG PO CAPS
150.0000 mg | ORAL_CAPSULE | Freq: Every day | ORAL | Status: DC
  Administered 2013-07-25 – 2013-07-27 (×3): 150 mg via ORAL
  Filled 2013-07-25 (×6): qty 3

## 2013-07-25 MED ORDER — SIMVASTATIN 20 MG PO TABS
20.0000 mg | ORAL_TABLET | Freq: Every day | ORAL | Status: DC
  Administered 2013-07-26 – 2013-07-27 (×3): 20 mg via ORAL
  Filled 2013-07-25 (×4): qty 1

## 2013-07-25 SURGICAL SUPPLY — 56 items
BANDAGE ESMARK 6X9 LF (GAUZE/BANDAGES/DRESSINGS) ×1 IMPLANT
BLADE SAGITTAL 13X1.27X60 (BLADE) ×2 IMPLANT
BLADE SAW SGTL 83.5X18.5 (BLADE) ×2 IMPLANT
BNDG CMPR 9X6 STRL LF SNTH (GAUZE/BANDAGES/DRESSINGS) ×1
BNDG ESMARK 6X9 LF (GAUZE/BANDAGES/DRESSINGS) ×2
BOWL SMART MIX CTS (DISPOSABLE) ×2 IMPLANT
CAP POR TM CP VIT E LN CER HD ×1 IMPLANT
CEMENT BONE SIMPLEX SPEEDSET (Cement) ×4 IMPLANT
CLOTH BEACON ORANGE TIMEOUT ST (SAFETY) ×2 IMPLANT
COVER SURGICAL LIGHT HANDLE (MISCELLANEOUS) ×2 IMPLANT
CUFF TOURNIQUET SINGLE 34IN LL (TOURNIQUET CUFF) ×2 IMPLANT
DRAPE EXTREMITY T 121X128X90 (DRAPE) ×2 IMPLANT
DRAPE INCISE IOBAN 66X45 STRL (DRAPES) ×4 IMPLANT
DRAPE PROXIMA HALF (DRAPES) ×2 IMPLANT
DRAPE U-SHAPE 47X51 STRL (DRAPES) ×2 IMPLANT
DRSG ADAPTIC 3X8 NADH LF (GAUZE/BANDAGES/DRESSINGS) ×2 IMPLANT
DRSG PAD ABDOMINAL 8X10 ST (GAUZE/BANDAGES/DRESSINGS) ×2 IMPLANT
DURAPREP 26ML APPLICATOR (WOUND CARE) ×4 IMPLANT
ELECT REM PT RETURN 9FT ADLT (ELECTROSURGICAL) ×2
ELECTRODE REM PT RTRN 9FT ADLT (ELECTROSURGICAL) ×1 IMPLANT
EVACUATOR 1/8 PVC DRAIN (DRAIN) ×2 IMPLANT
GLOVE BIOGEL M 7.0 STRL (GLOVE) IMPLANT
GLOVE BIOGEL PI IND STRL 7.5 (GLOVE) IMPLANT
GLOVE BIOGEL PI IND STRL 8.5 (GLOVE) ×2 IMPLANT
GLOVE BIOGEL PI INDICATOR 7.5 (GLOVE)
GLOVE BIOGEL PI INDICATOR 8.5 (GLOVE) ×2
GLOVE SURG ORTHO 8.0 STRL STRW (GLOVE) ×4 IMPLANT
GOWN PREVENTION PLUS XLARGE (GOWN DISPOSABLE) ×4 IMPLANT
GOWN STRL NON-REIN LRG LVL3 (GOWN DISPOSABLE) ×4 IMPLANT
HANDPIECE INTERPULSE COAX TIP (DISPOSABLE) ×2
HOOD PEEL AWAY FACE SHEILD DIS (HOOD) ×8 IMPLANT
KIT BASIN OR (CUSTOM PROCEDURE TRAY) ×2 IMPLANT
KIT ROOM TURNOVER OR (KITS) ×2 IMPLANT
MANIFOLD NEPTUNE II (INSTRUMENTS) ×2 IMPLANT
NDL HYPO 21X1.5 SAFETY (NEEDLE) ×1 IMPLANT
NEEDLE 22X1 1/2 (OR ONLY) (NEEDLE) ×2 IMPLANT
NEEDLE HYPO 21X1.5 SAFETY (NEEDLE) ×2 IMPLANT
NS IRRIG 1000ML POUR BTL (IV SOLUTION) ×2 IMPLANT
PACK TOTAL JOINT (CUSTOM PROCEDURE TRAY) ×2 IMPLANT
PAD ARMBOARD 7.5X6 YLW CONV (MISCELLANEOUS) ×4 IMPLANT
PADDING CAST COTTON 6X4 STRL (CAST SUPPLIES) ×2 IMPLANT
SET HNDPC FAN SPRY TIP SCT (DISPOSABLE) ×1 IMPLANT
SPONGE GAUZE 4X4 12PLY (GAUZE/BANDAGES/DRESSINGS) ×2 IMPLANT
STAPLER VISISTAT 35W (STAPLE) ×2 IMPLANT
SUCTION FRAZIER TIP 10 FR DISP (SUCTIONS) ×2 IMPLANT
SUT BONE WAX W31G (SUTURE) ×2 IMPLANT
SUT VIC AB 0 CTB1 27 (SUTURE) ×4 IMPLANT
SUT VIC AB 1 CT1 27 (SUTURE) ×4
SUT VIC AB 1 CT1 27XBRD ANBCTR (SUTURE) ×2 IMPLANT
SUT VIC AB 2-0 CT1 27 (SUTURE) ×4
SUT VIC AB 2-0 CT1 TAPERPNT 27 (SUTURE) ×2 IMPLANT
SYR CONTROL 10ML LL (SYRINGE) ×2 IMPLANT
TOWEL OR 17X24 6PK STRL BLUE (TOWEL DISPOSABLE) ×2 IMPLANT
TOWEL OR 17X26 10 PK STRL BLUE (TOWEL DISPOSABLE) ×2 IMPLANT
TRAY FOLEY CATH 16FRSI W/METER (SET/KITS/TRAYS/PACK) ×2 IMPLANT
WATER STERILE IRR 1000ML POUR (IV SOLUTION) ×4 IMPLANT

## 2013-07-25 NOTE — Plan of Care (Signed)
Problem: Consults Goal: Diagnosis- Total Joint Replacement Primary Total Knee Left     

## 2013-07-25 NOTE — Anesthesia Procedure Notes (Signed)
Procedure Name: LMA Insertion Date/Time: 07/25/2013 8:53 AM Performed by: Marena Chancy Pre-anesthesia Checklist: Patient identified, Patient being monitored, Emergency Drugs available, Timeout performed and Suction available Patient Re-evaluated:Patient Re-evaluated prior to inductionOxygen Delivery Method: Circle system utilized Preoxygenation: Pre-oxygenation with 100% oxygen Intubation Type: IV induction LMA: LMA inserted LMA Size: 4.0 Placement Confirmation: breath sounds checked- equal and bilateral and positive ETCO2 Tube secured with: Tape Dental Injury: Teeth and Oropharynx as per pre-operative assessment

## 2013-07-25 NOTE — Anesthesia Preprocedure Evaluation (Addendum)
Anesthesia Evaluation  Patient identified by MRN, date of birth, ID band Patient awake    Reviewed: Allergy & Precautions, H&P , NPO status , Patient's Chart, lab work & pertinent test results  History of Anesthesia Complications (+) PONV  Airway Mallampati: I TM Distance: >3 FB Neck ROM: Full    Dental   Pulmonary Current Smoker,          Cardiovascular     Neuro/Psych    GI/Hepatic   Endo/Other    Renal/GU      Musculoskeletal   Abdominal   Peds  Hematology   Anesthesia Other Findings   Reproductive/Obstetrics                         Anesthesia Physical Anesthesia Plan  ASA: III  Anesthesia Plan: General   Post-op Pain Management:    Induction: Intravenous  Airway Management Planned: LMA  Additional Equipment:   Intra-op Plan:   Post-operative Plan: Extubation in OR  Informed Consent: I have reviewed the patients History and Physical, chart, labs and discussed the procedure including the risks, benefits and alternatives for the proposed anesthesia with the patient or authorized representative who has indicated his/her understanding and acceptance.     Plan Discussed with: CRNA and Surgeon  Anesthesia Plan Comments:         Anesthesia Quick Evaluation

## 2013-07-25 NOTE — Transfer of Care (Signed)
Immediate Anesthesia Transfer of Care Note  Patient: Sonya Allen  Procedure(s) Performed: Procedure(s): TOTAL KNEE ARTHROPLASTY (Left)  Patient Location: PACU  Anesthesia Type:General  Level of Consciousness: awake, alert  and oriented  Airway & Oxygen Therapy: Patient Spontanous Breathing and Patient connected to nasal cannula oxygen  Post-op Assessment: Report given to PACU RN and Post -op Vital signs reviewed and stable  Post vital signs: Reviewed and stable  Complications: No apparent anesthesia complications

## 2013-07-25 NOTE — Progress Notes (Signed)
Orthopedic Tech Progress Note Patient Details:  Sonya Allen November 08, 1955 [id number] CPM applied to Left LE with appropriate settings. OHF applied to bed. Footsie roll provided.  CPM Left Knee CPM Left Knee: On Left Knee Flexion (Degrees): 90 Left Knee Extension (Degrees): 0   [name] 07/25/2013, 2:58 PM

## 2013-07-25 NOTE — Anesthesia Postprocedure Evaluation (Signed)
Anesthesia Post Note  Patient: Sonya Allen  Procedure(s) Performed: Procedure(s) (LRB): TOTAL KNEE ARTHROPLASTY (Left)  Anesthesia type: general  Patient location: PACU  Post pain: Pain level controlled  Post assessment: Patient's Cardiovascular Status Stable  Last Vitals:  Filed Vitals:   07/25/13 1300  BP:   Pulse:   Temp: 36.4 C  Resp:     Post vital signs: Reviewed and stable  Level of consciousness: sedated  Complications: No apparent anesthesia complications

## 2013-07-25 NOTE — Evaluation (Signed)
Physical Therapy Evaluation Patient Details Name: Sonya Allen MRN: [id number] DOB: 24-May-1956 Today's Date: 07/25/2013 Time: 1550-1610 PT Time Calculation (min): 20 min  PT Assessment / Plan / Recommendation History of Present Illness  Patient is a 57 yo female s/p Lt TKA.  Patient with h/o mult back surgeries, Rt rotator cuff injury, Rt ankle fusion.  Clinical Impression  Patient presents with problems listed below.  Patient will benefit from acute PT to maximize independence prior to discharge.  Pain limiting session today.    PT Assessment  Patient needs continued PT services    Follow Up Recommendations  Home health PT;Supervision/Assistance - 24 hour    Does the patient have the potential to tolerate intense rehabilitation      Barriers to Discharge Decreased caregiver support Husband works.  He will take a few days off to be with patient at discharge.    Equipment Recommendations  None recommended by PT    Recommendations for Other Services     Frequency 7X/week    Precautions / Restrictions Precautions Precautions: Knee Precaution Booklet Issued: Yes (comment) Precaution Comments: Reviewed precautions with patient. Restrictions Weight Bearing Restrictions: Yes LLE Weight Bearing: Weight bearing as tolerated   Pertinent Vitals/Pain Pain 7/10, limiting mobility today.  Patient tearful at end of session.      Mobility  Bed Mobility Bed Mobility: Supine to Sit;Sitting - Scoot to Edge of Bed Supine to Sit: 4: Min assist;With rails;HOB elevated Sitting - Scoot to Edge of Bed: 4: Min guard;With rail Details for Bed Mobility Assistance: Verbal cues for technique.  Assist to move LLE off of bed.  Patient able to use UE's to raise trunk to sitting position.  Patient sat EOB x 4 minutes with good balance. Transfers Transfers: Sit to Stand;Stand to [employer] Transfers Sit to Stand: 4: Min assist;With upper extremity assist;From bed Stand to Sit: 4: Min  assist;With upper extremity assist;With armrests;To chair/3-in-1 Stand Pivot Transfers: 4: Min assist Details for Transfer Assistance: Verbal cues for technique.  Assist to rise to standing and for balance.  Patient able to take several steps to pivot to chair.  Assist to control descent into chair. Ambulation/Gait Ambulation/Gait Assistance: Not tested (comment)    Exercises Total Joint Exercises Ankle Circles/Pumps: AROM;Left;10 reps;Seated (Unable with Rt ankle due to fusion)   PT Diagnosis: Difficulty walking;Acute pain  PT Problem List: Decreased strength;Decreased range of motion;Decreased activity tolerance;Decreased balance;Decreased mobility;Decreased knowledge of use of DME;Decreased knowledge of precautions;Pain PT Treatment Interventions: DME instruction;Gait training;Stair training;Functional mobility training;Therapeutic exercise;Patient/family education     PT Goals(Current goals can be found in the care plan section) Acute Rehab PT Goals Patient Stated Goal: To decrease pain. PT Goal Formulation: With patient Time For Goal Achievement: 08/01/13 Potential to Achieve Goals: Good  Visit Information  Last PT Received On: 07/25/13 Assistance Needed: +1 History of Present Illness: Patient is a 57 yo female s/p Lt TKA.  Patient with h/o mult back surgeries, Rt rotator cuff injury, Rt ankle fusion.       Prior Functioning  Home Living Family/patient expects to be discharged to:: Private residence Living Arrangements: Spouse/significant other Available Help at Discharge: Family;Available 24 hours/day Type of Home: Mobile home Home Access: Stairs to enter Entrance Stairs-Number of Steps: 4 Entrance Stairs-Rails: Right;Left Home Layout: One level Home Equipment: Walker - 2 wheels (Handicapped height toilet) Prior Function Level of Independence: Independent Communication Communication: No difficulties    Cognition  Cognition Arousal/Alertness: Awake/alert Behavior  During Therapy: WFL for tasks  assessed/performed Overall Cognitive Status: Within Functional Limits for tasks assessed    Extremity/Trunk Assessment Upper Extremity Assessment Upper Extremity Assessment: Overall WFL for tasks assessed Lower Extremity Assessment Lower Extremity Assessment: RLE deficits/detail;LLE deficits/detail RLE Deficits / Details: Decreased ROM ankle - patient s/p fusion. LLE Deficits / Details: Decreased strength and ROM due to surgery/pain.  Patient able to assist with moving LLE off of bed. LLE: Unable to fully assess due to pain   Balance Balance Balance Assessed: Yes Static Sitting Balance Static Sitting - Balance Support: No upper extremity supported;Feet supported Static Sitting - Level of Assistance: 5: Stand by assistance Static Sitting - Comment/# of Minutes: 4 Static Standing Balance Static Standing - Balance Support: Bilateral upper extremity supported Static Standing - Level of Assistance: 4: Min assist Static Standing - Comment/# of Minutes: 2  End of Session PT - End of Session Equipment Utilized During Treatment: Gait belt Activity Tolerance: Patient limited by pain;Patient limited by fatigue Patient left: in chair;with call bell/phone within reach Nurse Communication: Mobility status;Patient requests pain meds CPM Left Knee CPM Left Knee: Off  GP     Luan, Urbani 07/25/2013, 5:07 PM Durenda Hurt. Renaldo Fiddler, [facility] Acute Rehab Services Pager [phone]

## 2013-07-25 NOTE — H&P (Signed)
Sonya Allen MRN:  [id number] DOB/SEX:  05/31/1956/female  CHIEF COMPLAINT:  Painful left Knee  HISTORY: Patient is a 57 y.o. female presented with a history of pain in the left knee. Onset of symptoms was gradual starting several years ago with gradually worsening course since that time. Prior procedures on the knee include arthroscopy. Patient has been treated conservatively with over-the-counter NSAIDs and activity modification. Patient currently rates pain in the knee at 10 out of 10 with activity. There is pain at night.  PAST MEDICAL HISTORY: There are no active problems to display for this patient.  Past Medical History  Diagnosis Date  . PONV (postoperative nausea and vomiting)   . Bronchitis     hx of  . Constipation due to pain medication   . History of kidney stones   . Arthritis   . Hypercholesteremia   . Edema of extremities     Takes Lasix  . Bilateral dry eyes     Takes Systane Drops  . Fibromyalgia   . Restless leg syndrome     Takes Klonopin   Past Surgical History  Procedure Laterality Date  . Abdominal hysterectomy    . Back surgery      x 6 lumbar & cervical  . Knee arthroscopy Bilateral   . Rotator cuff repair Right   . Carpal tunnel release Bilateral   . Ankle fusion Right      MEDICATIONS:   Prescriptions prior to admission  Medication Sig Dispense Refill  . carisoprodol (SOMA) 350 MG tablet Take 350 mg by mouth 3 (three) times daily.      . Cholecalciferol (VITAMIN D-3 PO) Take 1 capsule by mouth daily.      . clonazePAM (KLONOPIN) 0.5 MG tablet Take 0.5 mg by mouth at bedtime. Restless legs      . fish oil-omega-3 fatty acids 1000 MG capsule Take 1 g by mouth 2 (two) times daily.      . furosemide (LASIX) 20 MG tablet Take 20 mg by mouth daily.      Sonya Allen Kitchen morphine (MS CONTIN) 60 MG 12 hr tablet Take 60 mg by mouth every 12 (twelve) hours.      Sonya Allen Kitchen oxycodone (ROXICODONE) 30 MG immediate release tablet Take 30 mg by mouth every 8 (eight) hours as  needed (for breakthrough pain).      Bertram Gala Glycol-Propyl Glycol (SYSTANE OP) Apply 1 drop to eye 2 (two) times daily as needed (dry eyes).      . pravastatin (PRAVACHOL) 40 MG tablet Take 40 mg by mouth daily.      . pregabalin (LYRICA) 150 MG capsule Take 150 mg by mouth daily.      Sonya Allen Kitchen venlafaxine XR (EFFEXOR-XR) 150 MG 24 hr capsule Take 150 mg by mouth daily.        ALLERGIES:   Allergies  Allergen Reactions  . Augmentin [Amoxicillin-Pot Clavulanate] Diarrhea    REVIEW OF SYSTEMS:  Pertinent items are noted in HPI.   FAMILY HISTORY:  No family history on file.  SOCIAL HISTORY:   History  Substance Use Topics  . Smoking status: Current Some Day Smoker -- 0.50 packs/day for 29 years    Types: Cigarettes  . Smokeless tobacco: Not on file  . Alcohol Use: No     EXAMINATION:  Vital signs in last 24 hours:    General appearance: alert, cooperative and no distress Lungs: clear to auscultation bilaterally Heart: regular rate and rhythm, S1, S2 normal, no murmur, click, rub  or gallop Abdomen: soft, non-tender; bowel sounds normal; no masses,  no organomegaly Extremities: extremities normal, atraumatic, no cyanosis or edema and Homans sign is negative, no sign of DVT Pulses: 2+ and symmetric Skin: Skin color, texture, turgor normal. No rashes or lesions Neurologic: Alert and oriented X 3, normal strength and tone. Normal symmetric reflexes. Normal coordination and gait  Musculoskeletal:  ROM 0-110, Ligaments intact,  Imaging Review Plain radiographs demonstrate severe degenerative joint disease of the left knee. The overall alignment is mild valgus. The bone quality appears to be good for age and reported activity level.  Assessment/Plan: End stage arthritis, left knee   The patient history, physical examination and imaging studies are consistent with advanced degenerative joint disease of the left knee. The patient has failed conservative treatment.  The clearance  notes were reviewed.  After discussion with the patient it was felt that Total Knee Replacement was indicated. The procedure,  risks, and benefits of total knee arthroplasty were presented and reviewed. The risks including but not limited to aseptic loosening, infection, blood clots, vascular injury, stiffness, patella tracking problems complications among others were discussed. The patient acknowledged the explanation, agreed to proceed with the plan.  Juanetta Negash 07/25/2013, 6:53 AM

## 2013-07-25 NOTE — Preoperative (Signed)
Beta Blockers   Reason not to administer Beta Blockers:Not Applicable 

## 2013-07-26 LAB — CBC
HCT: 32.2 % — ABNORMAL LOW (ref 36.0–46.0)
Hemoglobin: 10.9 g/dL — ABNORMAL LOW (ref 12.0–15.0)
MCH: 31.9 pg (ref 26.0–34.0)
MCHC: 33.9 g/dL (ref 30.0–36.0)
MCV: 94.2 fL (ref 78.0–100.0)
Platelets: 132 10*3/uL — ABNORMAL LOW (ref 150–400)
RBC: 3.42 MIL/uL — ABNORMAL LOW (ref 3.87–5.11)
RDW: 12.5 % (ref 11.5–15.5)
WBC: 8.3 10*3/uL (ref 4.0–10.5)

## 2013-07-26 LAB — BASIC METABOLIC PANEL
BUN: 8 mg/dL (ref 6–23)
CO2: 27 mEq/L (ref 19–32)
Calcium: 8.1 mg/dL — ABNORMAL LOW (ref 8.4–10.5)
Chloride: 104 mEq/L (ref 96–112)
Creatinine, Ser: 0.53 mg/dL (ref 0.50–1.10)
GFR calc Af Amer: >90 mL/min (ref >90)
GFR calc non Af Amer: >90 mL/min (ref >90)
Glucose, Bld: 122 mg/dL — ABNORMAL HIGH (ref 70–99)
Potassium: 3.6 mEq/L (ref 3.5–5.1)
Sodium: 140 mEq/L (ref 135–145)

## 2013-07-26 MED ORDER — SODIUM CHLORIDE 0.9 % IV BOLUS (SEPSIS)
500.0000 mL | Freq: Once | INTRAVENOUS | Status: AC
  Administered 2013-07-26: 500 mL via INTRAVENOUS

## 2013-07-26 MED ORDER — ENOXAPARIN SODIUM 40 MG/0.4ML ~~LOC~~ SOLN: 40.0000 mg | SUBCUTANEOUS | Status: DC

## 2013-07-26 MED ORDER — METHOCARBAMOL 500 MG PO TABS: 500.0000 mg | ORAL_TABLET | Freq: Four times a day (QID) | ORAL | Status: DC | PRN

## 2013-07-26 NOTE — Progress Notes (Signed)
SPORTS MEDICINE AND JOINT REPLACEMENT  Georgena Spurling, MD   Altamese Cabal, PA-C [address] [place], [place], [place]  [address]                             [phone]   PROGRESS NOTE  Subjective:  negative for Chest Pain  negative for Shortness of Breath  negative for Nausea/Vomiting   negative for Calf Pain  negative for Bowel Movement   Tolerating Diet: yes         Patient reports pain as 9 on 0-10 scale.    Objective: Vital signs in last 24 hours:   Patient Vitals for the past 24 hrs:  BP Temp Pulse Resp SpO2 Height Weight  07/26/13 0722 - - - 18 100 % - -  07/26/13 0622 140/76 mmHg 97.6 F (36.4 C) 95 18 99 % - -  07/25/13 2114 150/75 mmHg 98.2 F (36.8 C) 93 18 99 % - -  07/25/13 1334 - - - - - 5\' 8"  (1.727 m) 82.725 kg (182 lb 6 oz)  07/25/13 1330 - 98 F (36.7 C) 75 15 100 % - -  07/25/13 1310 121/82 mmHg - 75 14 100 % - -  07/25/13 1300 - 97.5 F (36.4 C) - - - - -  07/25/13 1255 114/57 mmHg - 77 13 100 % - -  07/25/13 1240 124/68 mmHg - 75 11 100 % - -  07/25/13 1225 129/64 mmHg - 78 18 100 % - -  07/25/13 1210 127/68 mmHg - 77 20 99 % - -  07/25/13 1200 139/87 mmHg - 90 18 100 % - -  07/25/13 1155 139/87 mmHg - 83 13 100 % - -  07/25/13 1140 117/97 mmHg - 91 21 100 % - -  07/25/13 1125 103/73 mmHg - 88 18 99 % - -  07/25/13 1110 128/69 mmHg 97.4 F (36.3 C) 91 10 100 % - -  07/25/13 0825 92/52 mmHg - 63 13 97 % - -  07/25/13 0820 94/48 mmHg - 64 10 97 % - -  07/25/13 0815 101/71 mmHg - 63 11 99 % - -  07/25/13 0810 101/53 mmHg - 60 16 98 % - -    @flow {1959:LAST@   Intake/Output from previous day:   12/01 0701 - 12/02 0700 In: 3467.5 [P.O.:720; I.V.:2697.5] Out: 2685 [Urine:2525; Drains:160]   Intake/Output this shift:       Intake/Output     12/01 0701 - 12/02 0700 12/02 0701 - 12/03 0700   P.O. 720    I.V. (mL/kg) 2697.5 (32.6)    IV Piggyback 50    Total Intake(mL/kg) 3467.5 (41.9)    Urine (mL/kg/hr) 2525 (1.3)    Drains 160 (0.1)    Total Output 2685     Net +782.5             LABORATORY DATA:  Recent Labs  07/25/13 1400 07/26/13 0500  WBC 8.3 8.3  HGB 11.9* 10.9*  HCT 34.0* 32.2*  PLT 149* 132*    Recent Labs  07/25/13 1400 07/26/13 0500  NA  --  140  K  --  3.6  CL  --  104  CO2  --  27  BUN  --  8  CREATININE 0.59 0.53  GLUCOSE  --  122*  CALCIUM  --  8.1*   Lab Results  Component Value Date   INR 1.04 07/15/2013    Examination:  General appearance: alert, cooperative and no distress Extremities: Homans sign is negative, no sign of DVT  Wound Exam: clean, dry, intact   Drainage:  Scant/small amount Serosanguinous exudate  Motor Exam: EHL and FHL Intact  Sensory Exam: Deep Peroneal normal   Assessment:    1 Day Post-Op  Procedure(s) (LRB): TOTAL KNEE ARTHROPLASTY (Left)  ADDITIONAL DIAGNOSIS:  Active Problems:   S/P total knee arthroplasty  Acute Blood Loss Anemia   Plan: Physical Therapy as ordered Weight Bearing as Tolerated (WBAT)  DVT Prophylaxis:  Lovenox  DISCHARGE PLAN: Home  DISCHARGE NEEDS: HHPT, CPM, Walker and 3-in-1 comode seat         Kimothy Kishimoto 07/26/2013, 8:06 AM

## 2013-07-26 NOTE — Progress Notes (Signed)
Orthopedic Tech Progress Note Patient Details:  MERCED BROUGHAM 09-Apr-1956 [id number]  Patient ID: Lovell Sheehan, female   DOB: [birth year], 56 y.o.   MRN: [id number] Placed pt's lle in cpm @0 -70 degrees; will increase as pt tolerates ;put in cpm @1445   Nikki Dom 07/26/2013, 2:41 PM

## 2013-07-26 NOTE — Progress Notes (Signed)
Physical Therapy Treatment Patient Details Name: Sonya Allen MRN: [id number] DOB: [birth year] Today's Date: 07/26/2013 Time: 1308-6578 PT Time Calculation (min): 30 min  PT Assessment / Plan / Recommendation  History of Present Illness Patient is a 57 yo female s/p Lt TKA.  Patient with h/o mult back surgeries, Rt rotator cuff injury, Rt ankle fusion.   PT Comments   Pt is making slow progress towards physical therapy goals. She continues to be limited by pain and appears especially painful during ambulation when knee is extended. Because of this, she keeps knee bent during stance phase and is unsteady. Needs frequent cues for sequencing and technique.   Follow Up Recommendations  Home health PT;Supervision/Assistance - 24 hour     Does the patient have the potential to tolerate intense rehabilitation     Barriers to Discharge        Equipment Recommendations  None recommended by PT    Recommendations for Other Services    Frequency 7X/week   Progress towards PT Goals Progress towards PT goals: Progressing toward goals  Plan Current plan remains appropriate    Precautions / Restrictions Precautions Precautions: Knee;Fall Precaution Booklet Issued: Yes (comment) Precaution Comments: Reviewed precautions with patient. Restrictions Weight Bearing Restrictions: Yes LLE Weight Bearing: Weight bearing as tolerated   Pertinent Vitals/Pain Pt with 7/10 pain throughout session and became tearful at times. RN present at end of session to provide medication to assist with pain control.    Mobility  Bed Mobility Bed Mobility: Supine to Sit;Sitting - Scoot to Edge of Bed Supine to Sit: 4: Min assist;HOB elevated;With rails Sitting - Scoot to Edge of Bed: 4: Min assist;Not tested (comment) Details for Bed Mobility Assistance: VC's for sequencing and technique. Assist for LLE movement to EOB. Transfers Transfers: Sit to Stand;Stand to Sit Sit to Stand: 3: Mod assist;From bed;With  upper extremity assist Stand to Sit: 3: Mod assist;To chair/3-in-1;With upper extremity assist Details for Transfer Assistance: VC's for hand placement on seated surface for safety. Assist to come to full stand. Ambulation/Gait Ambulation/Gait Assistance: 3: Mod assist Ambulation Distance (Feet): 8 Feet Assistive device: Rolling walker Ambulation/Gait Assistance Details: Assist to unweight and shift weight to take steps. VC's for sequencing and safety awareness.  Gait Pattern: Step-to pattern;Decreased stride length;Narrow base of support Gait velocity: Decreased    Exercises     PT Diagnosis:    PT Problem List:   PT Treatment Interventions:     PT Goals (current goals can now be found in the care plan section) Acute Rehab PT Goals Patient Stated Goal: get rid of the pain. PT Goal Formulation: With patient Time For Goal Achievement: 08/01/13 Potential to Achieve Goals: Good  Visit Information  Last PT Received On: 07/26/13 Assistance Needed: +1 History of Present Illness: Patient is a 57 yo female s/p Lt TKA.  Patient with h/o mult back surgeries, Rt rotator cuff injury, Rt ankle fusion.    Subjective Data  Subjective: "I have been in so much pain, but he just gave me something for it." Patient Stated Goal: get rid of the pain.   Cognition  Cognition Arousal/Alertness: Awake/alert Behavior During Therapy: WFL for tasks assessed/performed Overall Cognitive Status: Within Functional Limits for tasks assessed    Balance  Balance Balance Assessed: Yes Static Sitting Balance Static Sitting - Balance Support: No upper extremity supported;Feet supported Static Sitting - Level of Assistance: 5: Stand by assistance Static Sitting - Comment/# of Minutes: 3 Static Standing Balance Static Standing - Balance  Support: Bilateral upper extremity supported Static Standing - Level of Assistance: 4: Min assist Static Standing - Comment/# of Minutes: 2  End of Session PT - End of  Session Equipment Utilized During Treatment: Gait belt Activity Tolerance: Patient limited by pain;Patient limited by fatigue Patient left: in chair;with call bell/phone within reach;with nursing/sitter in room;Other (comment) (OT present in room) Nurse Communication: Mobility status;Patient requests pain meds CPM Left Knee CPM Left Knee: On Left Knee Flexion (Degrees): 90 Left Knee Extension (Degrees): 0   GP     Ruthann Cancer 07/26/2013, 11:52 AM  Ruthann Cancer, PT, DPT [phone]

## 2013-07-26 NOTE — Care Management Note (Signed)
Case Manager spoke with patient concerning home health and equipment  needs at discharge. Preoperatively setup with [facility], no changes. Walker , 3in1 and CPM have been delivered by [employer].

## 2013-07-26 NOTE — Progress Notes (Signed)
Orthopedic Tech Progress Note Patient Details:  Sonya Allen Apr 23, 1956 [id number] On cpm at 7:55 pm LLE 0-70 Patient ID: ELGA SANTY, female   DOB: 18-Jul-1956, 57 y.o.   MRN: [id number]   Jennye Moccasin 07/26/2013, 7:54 PM

## 2013-07-26 NOTE — Progress Notes (Signed)
Physical Therapy Treatment Patient Details Name: Sonya Allen MRN: [id number] DOB: 1956/03/04 Today's Date: 07/26/2013 Time: 6213-0865 PT Time Calculation (min): 30 min  PT Assessment / Plan / Recommendation  History of Present Illness Patient is a 57 yo female s/p Lt TKA.  Patient with h/o mult back surgeries, Rt rotator cuff injury, Rt ankle fusion.   PT Comments   Pt progressing slowly towards physical therapy goals. Discussed with pt that she would have to push herself a little more if home is going to be a safe option for her. Overall, good rehab effort compared to morning session.   Follow Up Recommendations  Home health PT;Supervision/Assistance - 24 hour     Does the patient have the potential to tolerate intense rehabilitation     Barriers to Discharge        Equipment Recommendations  None recommended by PT    Recommendations for Other Services    Frequency 7X/week   Progress towards PT Goals Progress towards PT goals: Progressing toward goals  Plan Current plan remains appropriate    Precautions / Restrictions Precautions Precautions: Knee;Fall Restrictions Weight Bearing Restrictions: Yes LLE Weight Bearing: Weight bearing as tolerated   Pertinent Vitals/Pain Pt reports her pain is more tolerable this afternoon.    Mobility  Bed Mobility Bed Mobility: Sit to Supine Sit to Supine: 5: Supervision;HOB flat;With rail Details for Bed Mobility Assistance: VC's for sequencing and technique. Pt was able to elevate LE's into bed without physical assist Transfers Transfers: Sit to Stand;Stand to Sit Sit to Stand: 4: Min assist;From chair/3-in-1;With upper extremity assist Stand to Sit: 4: Min guard;To bed;With upper extremity assist Details for Transfer Assistance: VC's for safety awareness and hand placement. Ambulation/Gait Ambulation/Gait Assistance: 4: Min assist Ambulation Distance (Feet): 15 Feet Assistive device: Rolling walker Ambulation/Gait Assistance  Details: Assist for walker placement occasionally. VC's for sequencing and safety awareness. Gait Pattern: Step-to pattern;Decreased stride length;Narrow base of support Gait velocity: Decreased    Exercises Total Joint Exercises Quad Sets: 10 reps Heel Slides: 10 reps Straight Leg Raises: 10 reps Goniometric ROM: 15-75   PT Diagnosis:    PT Problem List:   PT Treatment Interventions:     PT Goals (current goals can now be found in the care plan section) Acute Rehab PT Goals Patient Stated Goal: get rid of the pain. PT Goal Formulation: With patient Time For Goal Achievement: 08/01/13 Potential to Achieve Goals: Good  Visit Information  Last PT Received On: 07/26/13 Assistance Needed: +1 History of Present Illness: Patient is a 57 yo female s/p Lt TKA.  Patient with h/o mult back surgeries, Rt rotator cuff injury, Rt ankle fusion.    Subjective Data  Subjective: "I'm tired of sitting here. Can I get back into bed?" Patient Stated Goal: get rid of the pain.   Cognition  Cognition Arousal/Alertness: Awake/alert Behavior During Therapy: WFL for tasks assessed/performed Overall Cognitive Status: Within Functional Limits for tasks assessed    Balance     End of Session PT - End of Session Equipment Utilized During Treatment: Gait belt Activity Tolerance: Patient limited by pain;Patient limited by fatigue Patient left: in bed;with call bell/phone within reach Nurse Communication: Mobility status   GP     Ruthann Cancer 07/26/2013, 4:38 PM  Ruthann Cancer, PT, DPT [phone]

## 2013-07-26 NOTE — Evaluation (Signed)
Occupational Therapy Evaluation Patient Details Name: Sonya Allen MRN: [id number] DOB: 1956/04/23 Today's Date: 07/26/2013 Time: 4098-1191 OT Time Calculation (min): 22 min  OT Assessment / Plan / Recommendation History of present illness Patient is a 58 yo female s/p Lt TKA.  Patient with h/o mult back surgeries, Rt rotator cuff injury, Rt ankle fusion.   Clinical Impression   Pt admitted for the above diagnosis and has the deficits listed below.  Pt would benefit from cont OT to increase basic I with all adls so she can be alone when her husband returns to work.    OT Assessment  Patient needs continued OT Services    Follow Up Recommendations  Home health OT;Supervision/Assistance - 24 hour    Barriers to Discharge      Equipment Recommendations  3 in 1 bedside comode    Recommendations for Other Services    Frequency  Min 2X/week    Precautions / Restrictions Precautions Precautions: Knee Precaution Booklet Issued: Yes (comment) Precaution Comments: Reviewed precautions with patient. Restrictions Weight Bearing Restrictions: Yes LLE Weight Bearing: Weight bearing as tolerated   Pertinent Vitals/Pain Pt with 8/10 pain in knee today limited progress.  All other vitals stable.     ADL  Eating/Feeding: Performed;Independent Where Assessed - Eating/Feeding: Chair Grooming: Performed;Teeth care;Wash/dry face;Wash/dry hands;Supervision/safety Where Assessed - Grooming: Unsupported sitting Upper Body Bathing: Simulated;Set up Where Assessed - Upper Body Bathing: Unsupported sitting Lower Body Bathing: Simulated;Moderate assistance Where Assessed - Lower Body Bathing: Supported sit to stand Upper Body Dressing: Performed;Set up Where Assessed - Upper Body Dressing: Unsupported sitting Lower Body Dressing: Performed;Moderate assistance Where Assessed - Lower Body Dressing: Supported sit to stand Toilet Transfer: Performed;Minimal Dentist Method:  Surveyor, minerals: Materials engineer and Hygiene: Performed;Min guard Where Assessed - Engineer, mining and Hygiene: Standing Equipment Used: Rolling walker Transfers/Ambulation Related to ADLs: Pt took a few steps in room but was limited with distance due to pain. ADL Comments: pt had more difficulty with LE adls due to pain.    OT Diagnosis: Generalized weakness;Acute pain  OT Problem List: Decreased strength;Decreased activity tolerance;Decreased knowledge of use of DME or AE;Pain OT Treatment Interventions: Self-care/ADL training;DME and/or AE instruction;Therapeutic activities   OT Goals(Current goals can be found in the care plan section) Acute Rehab OT Goals Patient Stated Goal: get rid of the pain. OT Goal Formulation: With patient Time For Goal Achievement: 08/02/13 Potential to Achieve Goals: Good ADL Goals Pt Will Perform Grooming: with supervision;standing Pt Will Perform Lower Body Dressing: with supervision;with adaptive equipment;sit to/from stand Pt Will Perform Tub/Shower Transfer: Shower transfer;with supervision;ambulating;shower seat Additional ADL Goal #1: Pt will complete all toileting on comfort height commode with S.  Visit Information  Last OT Received On: 07/26/13 Assistance Needed: +1 PT/OT Co-Evaluation/Treatment: Yes History of Present Illness: Patient is a 57 yo female s/p Lt TKA.  Patient with h/o mult back surgeries, Rt rotator cuff injury, Rt ankle fusion.       Prior Functioning     Home Living Family/patient expects to be discharged to:: Private residence Living Arrangements: Spouse/significant other Available Help at Discharge: Family;Available 24 hours/day Type of Home: Mobile home Home Access: Stairs to enter Entrance Stairs-Number of Steps: 4 Entrance Stairs-Rails: Right;Left Home Layout: One level Home Equipment: Walker - 2 wheels;Shower seat - built in Prior  Function Level of Independence: Independent Communication Communication: No difficulties Dominant Hand: Right  Vision/Perception Vision - History Baseline Vision: No visual deficits Patient Visual Report: No change from baseline Vision - Assessment Vision Assessment: Vision not tested   Cognition  Cognition Arousal/Alertness: Awake/alert Behavior During Therapy: WFL for tasks assessed/performed Overall Cognitive Status: Within Functional Limits for tasks assessed    Extremity/Trunk Assessment Upper Extremity Assessment Upper Extremity Assessment: Overall WFL for tasks assessed Lower Extremity Assessment Lower Extremity Assessment: Defer to PT evaluation Cervical / Trunk Assessment Cervical / Trunk Assessment: Normal     Mobility Bed Mobility Bed Mobility: Not assessed Transfers Transfers: Sit to Stand;Stand to Sit Sit to Stand: 4: Min assist;With upper extremity assist;From bed Stand to Sit: 4: Min assist;With upper extremity assist;With armrests;To chair/3-in-1 Details for Transfer Assistance: Verbal cues for technique.  Assist to rise to standing and for balance.  Patient able to take several steps to pivot to chair.  Assist to control descent into chair.     Exercise     Balance Balance Balance Assessed: Yes Static Sitting Balance Static Sitting - Balance Support: No upper extremity supported;Feet supported Static Sitting - Level of Assistance: 5: Stand by assistance Static Sitting - Comment/# of Minutes: 3 Static Standing Balance Static Standing - Balance Support: Bilateral upper extremity supported Static Standing - Level of Assistance: 4: Min assist Static Standing - Comment/# of Minutes: 2   End of Session CPM Left Knee CPM Left Knee: On Left Knee Flexion (Degrees): 90 Left Knee Extension (Degrees): 0  GO     Hope Budds 07/26/2013, 10:15 AM [phone]

## 2013-07-27 ENCOUNTER — Encounter (HOSPITAL_COMMUNITY): Payer: Self-pay | Admitting: Orthopedic Surgery

## 2013-07-27 LAB — CBC
HCT: 26.1 % — ABNORMAL LOW (ref 36.0–46.0)
Hemoglobin: 8.9 g/dL — ABNORMAL LOW (ref 12.0–15.0)
MCH: 31.4 pg (ref 26.0–34.0)
MCHC: 34.1 g/dL (ref 30.0–36.0)
MCV: 92.2 fL (ref 78.0–100.0)
Platelets: 114 10*3/uL — ABNORMAL LOW (ref 150–400)
RBC: 2.83 MIL/uL — ABNORMAL LOW (ref 3.87–5.11)
RDW: 12.6 % (ref 11.5–15.5)
WBC: 6.3 10*3/uL (ref 4.0–10.5)

## 2013-07-27 MED ORDER — BISACODYL 10 MG RE SUPP
10.0000 mg | Freq: Every day | RECTAL | Status: DC | PRN
Start: 2013-07-27 — End: 2013-07-28

## 2013-07-27 MED ORDER — MAGNESIUM HYDROXIDE 400 MG/5ML PO SUSP
30.0000 mL | Freq: Every day | ORAL | Status: DC | PRN
  Administered 2013-07-27: 30 mL via ORAL
  Filled 2013-07-27: qty 30

## 2013-07-27 NOTE — Progress Notes (Signed)
Occupational Therapy Treatment Patient Details Name: Sonya Allen MRN: [id number] DOB: Feb 18, 1956 Today's Date: 07/27/2013 Time: 4098-1191 OT Time Calculation (min): 32 min  OT Assessment / Plan / Recommendation  History of present illness Patient is a 57 yo female s/p Lt TKA.  Patient with h/o mult back surgeries, Rt rotator cuff injury, Rt ankle fusion.   OT comments  Pt progressing nicely toward POC/goals for transfers, grooming and LB dressing techniques. She continues to be limited by pain (8/10 L knee), RN made aware and gave pain medication. Cont to recommend HHOT & 3:1 at d/c w/ 24/PRN assist.  Follow Up Recommendations  Home health OT;Supervision/Assistance - 24 hour    Barriers to Discharge       Equipment Recommendations  3 in 1 bedside comode    Recommendations for Other Services    Frequency Min 2X/week   Progress towards OT Goals Progress towards OT goals: Progressing toward goals  Plan Discharge plan remains appropriate    Precautions / Restrictions Precautions Precautions: Knee;Fall Restrictions Weight Bearing Restrictions: Yes LLE Weight Bearing: Weight bearing as tolerated   Pertinent Vitals/Pain 8/10 l knee pain, RN made aware and gave medication. Repositioned, rest.    ADL  Grooming: Performed;Wash/dry hands;Wash/dry face;Teeth care;Brushing hair;Supervision/safety (Standing at sink in bathroom) Where Assessed - Grooming: Supported standing;Unsupported standing Lower Body Bathing: Performed;Min guard Where Assessed - Lower Body Bathing: Supported sit to stand Upper Body Dressing: Performed;Set up Where Assessed - Upper Body Dressing: Unsupported sitting Lower Body Dressing: Performed;Minimal assistance Where Assessed - Lower Body Dressing: Supported sit to stand Toilet Transfer: Performed;Supervision/safety (Increased time for tasks, moves slowly, pain limiting factor) Toilet Transfer Method: Sit to stand (Ambulated into bathroom) Education administrator: Bedside commode Toileting - Clothing Manipulation and Hygiene: Supervision/safety Where Assessed - Engineer, mining and Hygiene: Standing Tub/Shower Transfer: Simulated;Minimal assistance (Simulated in room, discussed & handout issued.) Tub/Shower Transfer Method: Ambulating Equipment Used: Rolling walker Transfers/Ambulation Related to ADLs: Pt was Mod I bed mobility and ambulated into bathroom, stood at sink for grooming and performed 3:1 toilet transfer as well prior to ambulating back to chair. Overall supervision level for transfers, pain is a limiting factor for pt RN aware & gave medication. ADL Comments: Pt overall supervision level for standing at sink for grooming tasks, increased time for all tasks secondary to pain rated 8/10, RN aware and gave IV meds. Pt is Min assist for LB dressing. Stood w/ supervision for LB bathing/peri-care. Pt educated verbally and via handouts re: LE dressing w/ a/e PRN & shower transfers.    OT Diagnosis:    OT Problem List:   OT Treatment Interventions:     OT Goals(current goals can now be found in the care plan section) Acute Rehab OT Goals Patient Stated Goal: Decreased pain OT Goal Formulation: With patient Time For Goal Achievement: 08/02/13 Potential to Achieve Goals: Good  Visit Information  Last OT Received On: 07/27/13 Assistance Needed: +1 History of Present Illness: Patient is a 57 yo female s/p Lt TKA.  Patient with h/o mult back surgeries, Rt rotator cuff injury, Rt ankle fusion.    Subjective Data      Prior Functioning       Cognition  Cognition Arousal/Alertness: Awake/alert Behavior During Therapy: WFL for tasks assessed/performed Overall Cognitive Status: Within Functional Limits for tasks assessed    Mobility  Bed Mobility Bed Mobility: Supine to Sit;Sitting - Scoot to Edge of Bed Supine to Sit: With rails;HOB elevated;6: Modified independent (Device/Increase time);5:  Supervision Sitting -  Scoot to Edge of Bed: 6: Modified independent (Device/Increase time);With rail Transfers Transfers: Sit to Stand;Stand to Sit Sit to Stand: 5: Supervision;From bed;From chair/3-in-1;With upper extremity assist Stand to Sit: 5: Supervision;To chair/3-in-1;With upper extremity assist Details for Transfer Assistance: Pt demonstrates good hand placement and controlled decent during transfers, limited by pain level.        Balance Balance Balance Assessed: Yes Static Sitting Balance Static Sitting - Balance Support: No upper extremity supported;Feet supported Static Sitting - Level of Assistance: 7: Independent Static Sitting - Comment/# of Minutes: Static Standing Balance Static Standing - Balance Support: Left upper extremity supported;During functional activity (Standing at sink to brush teeth & grooming tasks) Static Standing - Level of Assistance: 5: Stand by assistance Static Standing - Comment/# of Minutes: 10 min   End of Session OT - End of Session Equipment Utilized During Treatment: Rolling walker Activity Tolerance: Patient tolerated treatment well;Patient limited by pain Patient left: in chair;with call bell/phone within reach;with nursing/sitter in room Nurse Communication: Patient requests pain meds  GO     Alm Bustard 07/27/2013, 9:32 AM

## 2013-07-27 NOTE — Clinical Social Work Psychosocial (Signed)
Clinical Social Work Department  BRIEF PSYCHOSOCIAL ASSESSMENT  Patient: Sonya Allen  Account [id number] Admit date: 07/25/13 Clinical Social Worker Sabino Niemann, MSW Date/Time: 07/27/2013 12:30 PM Referred by: Physician Date Referred: 07/27/2013 Referred for   SNF Placement   Other Referral:  Interview type: Patient  Other interview type: PSYCHOSOCIAL DATA  Living Status:Husband Admitted from facility:  Level of care:  Primary support name: Schauf,Ronald  Primary support relationship to patient: Spouse Degree of support available:  Strong and vested  CURRENT CONCERNS  Current Concerns   Post-Acute Placement   Other Concerns:  SOCIAL WORK ASSESSMENT / PLAN  CSW met with pt re: PT recommendation for SNF.   Pt lives with  her spouse  CSW explained placement process and answered questions.   Pt reports no preference at this time   CSW completed FL2 and initiated SNF search.     Assessment/plan status: Information/Referral to [facility]  Other assessment/ plan:  Information/referral to community resources:  SNF     PATIENT'S/FAMILY'S RESPONSE TO PLAN OF CARE:  Pt  reports she is agreeable to [facility] in order to increase strength and independence with mobility prior to returning home  Pt verbalized understanding of placement process and appreciation for CSW assist.   Sabino Niemann, MSW, LCSWA [phone]

## 2013-07-27 NOTE — Progress Notes (Signed)
Physical Therapy Treatment Patient Details Name: Sonya Allen MRN: [id number] DOB: [birth year] Today's Date: 07/27/2013 Time: 4132-4401 PT Time Calculation (min): 29 min  PT Assessment / Plan / Recommendation  History of Present Illness Patient is a 57 yo female s/p Lt TKA.  Patient with h/o mult back surgeries, Rt rotator cuff injury, Rt ankle fusion.   PT Comments   Pt progressing slowly towards physical therapy goals. Pain continues to be a significant limiting factor for pt during functional mobility and gait training. Pt was able to negotiate 2 steps with husband present for walker stability and safety, as well as mod assist from therapist. Needs to practice steps again prior to d/c if going home, preferably with husband present for continued pt/family education.   Follow Up Recommendations  SNF     Does the patient have the potential to tolerate intense rehabilitation     Barriers to Discharge        Equipment Recommendations  None recommended by PT    Recommendations for Other Services    Frequency 7X/week   Progress towards PT Goals Progress towards PT goals: Progressing toward goals  Plan Current plan remains appropriate    Precautions / Restrictions Precautions Precautions: Knee;Fall Restrictions Weight Bearing Restrictions: Yes LLE Weight Bearing: Weight bearing as tolerated   Pertinent Vitals/Pain 8/10 at end of session.    Mobility  Bed Mobility Bed Mobility: Supine to Sit;Sitting - Scoot to Edge of Bed Supine to Sit: 5: Supervision;HOB flat;With rails Sitting - Scoot to Edge of Bed: With rail;5: Supervision Details for Bed Mobility Assistance: Increased time needed due to high pain level. Heavy use of bed rail for support.  Transfers Transfers: Sit to Stand;Stand to Sit Sit to Stand: 4: Min guard;From bed;With upper extremity assist Stand to Sit: 4: Min assist;To chair/3-in-1;With upper extremity assist Details for Transfer Assistance: Assist required for  controlled descent to chair. Ambulation/Gait Ambulation/Gait Assistance: 4: Min guard Ambulation Distance (Feet): 30 Feet Assistive device: Rolling walker Ambulation/Gait Assistance Details: VC's for sequencing and safety awareness. Very slow, and did not make corrective changes for technique when cued. Gait Pattern: Step-to pattern;Decreased stride length;Narrow base of support;Trunk flexed Gait velocity: Decreased Stairs: Yes Stairs Assistance: 3: Mod assist Stairs Assistance Details (indicate cue type and reason): Assist for unweighting pt as she advanced up 2 steps. VC's for sequencing and technique. Husband present for family education.  Stair Management Technique: Backwards;With walker Number of Stairs: 2    Exercises     PT Diagnosis:    PT Problem List:   PT Treatment Interventions:     PT Goals (current goals can now be found in the care plan section) Acute Rehab PT Goals Patient Stated Goal: Decreased pain PT Goal Formulation: With patient Time For Goal Achievement: 08/01/13 Potential to Achieve Goals: Good  Visit Information  Last PT Received On: 07/27/13 Assistance Needed: +1 History of Present Illness: Patient is a 57 yo female s/p Lt TKA.  Patient with h/o mult back surgeries, Rt rotator cuff injury, Rt ankle fusion.    Subjective Data  Subjective: "I don't feel any better." Patient Stated Goal: Decreased pain   Cognition  Cognition Arousal/Alertness: Awake/alert Behavior During Therapy: WFL for tasks assessed/performed Overall Cognitive Status: Within Functional Limits for tasks assessed    Balance     End of Session PT - End of Session Equipment Utilized During Treatment: Gait belt Activity Tolerance: Patient limited by pain Patient left: in chair;with call bell/phone within reach Nurse Communication:  Mobility status CPM Left Knee CPM Left Knee: Off Left Knee Flexion (Degrees): 70 Left Knee Extension (Degrees): 0   GP     Ruthann Cancer 07/27/2013, 4:29 PM  Ruthann Cancer, PT, DPT [phone]

## 2013-07-27 NOTE — Op Note (Signed)
TOTAL KNEE REPLACEMENT OPERATIVE NOTE:  07/25/2013  7:06 AM  PATIENT:  Sonya Allen  57 y.o. female  PRE-OPERATIVE DIAGNOSIS:  osteoarthritis left knee  POST-OPERATIVE DIAGNOSIS:  osteoarthritis left knee  PROCEDURE:  Procedure(s): TOTAL KNEE ARTHROPLASTY  SURGEON:  Surgeon(s): Dannielle Huh, MD  PHYSICIAN ASSISTANT: Altamese Cabal, [facility]  ANESTHESIA:   general  DRAINS: Hemovac  SPECIMEN: None  COUNTS:  Correct  TOURNIQUET:   Total Tourniquet Time Documented: Thigh (Left) - 50 minutes Total: Thigh (Left) - 50 minutes   DICTATION:  Indication for procedure:    The patient is a 57 y.o. female who has failed conservative treatment for osteoarthritis left knee.  Informed consent was obtained prior to anesthesia. The risks versus benefits of the operation were explain and in a way the patient can, and did, understand.   On the implant demand matching protocol, this patient scored 10.  Therefore, this patient was not receive a polyethylene insert with vitamin E which is a high demand implant.  Description of procedure:     The patient was taken to the operating room and placed under anesthesia.  The patient was positioned in the usual fashion taking care that all body parts were adequately padded and/or protected.  I foley catheter was placed.  A tourniquet was applied and the leg prepped and draped in the usual sterile fashion.  The extremity was exsanguinated with the esmarch and tourniquet inflated to 350 mmHg.  Pre-operative range of motion was normal.  The knee was in 3 degree of mild varus.  A midline incision approximately 6-7 inches long was made with a #10 blade.  A new blade was used to make a parapatellar arthrotomy going 2-3 cm into the quadriceps tendon, over the patella, and alongside the medial aspect of the patellar tendon.  A synovectomy was then performed with the #10 blade and forceps. I then elevated the deep MCL off the medial tibial metaphysis subperiosteally  around to the semimembranosus attachment.    I everted the patella and used calipers to measure patellar thickness.  I used the reamer to ream down to appropriate thickness to recreate the native thickness.  I then removed excess bone with the rongeur and sagittal saw.  I used the appropriately sized template and drilled the three lug holes.  I then put the trial in place and measured the thickness with the calipers to ensure recreation of the native thickness.  The trial was then removed and the patella subluxed and the knee brought into flexion.  A homan retractor was place to retract and protect the patella and lateral structures.  A Z-retractor was place medially to protect the medial structures.  The extra-medullary alignment system was used to make cut the tibial articular surface perpendicular to the anamotic axis of the tibia and in 3 degrees of posterior slope.  The cut surface and alignment jig was removed.  I then used the intramedullary alignment guide to make a 6 valgus cut on the distal femur.  I then marked out the epicondylar axis on the distal femur.  The posterior condylar axis measured 3 degrees.  I then used the anterior referencing sizer and measured the femur to be a size 6.  The 4-In-1 cutting block was screwed into place in external rotation matching the posterior condylar angle, making our cuts perpendicular to the epicondylar axis.  Anterior, posterior and chamfer cuts were made with the sagittal saw.  The cutting block and cut pieces were removed.  A lamina  spreader was placed in 90 degrees of flexion.  The ACL, PCL, menisci, and posterior condylar osteophytes were removed.  A 10 mm spacer blocked was found to offer good flexion and extension gap balance after minimal in degree releasing.   The scoop retractor was then placed and the femoral finishing block was pinned in place.  The small sagittal saw was used as well as the lug drill to finish the femur.  The block and cut  surfaces were removed and the medullary canal hole filled with autograft bone from the cut pieces.  The tibia was delivered forward in deep flexion and external rotation.  A size D tray was selected and pinned into place centered on the medial 1/3 of the tibial tubercle.  The reamer and keel was used to prepare the tibia through the tray.    I then trialed with the size 6 femur, size D tibia, a 10 mm insert and the 35 patella.  I had excellent flexion/extension gap balance, excellent patella tracking.  Flexion was full and beyond 120 degrees; extension was zero.  These components were chosen and the staff opened them to me on the back table while the knee was lavaged copiously and the cement mixed.  The soft tissue was infiltrated with 60cc of exparel 1.3% through a 21 gauge needle.  I cemented in the components and removed all excess cement.  The polyethylene tibial component was snapped into place and the knee placed in extension while cement was hardening.  The capsule was infilltrated with 30cc of .25% Marcaine with epinephrine.  A hemovac was place in the joint exiting superolaterally.  A pain pump was place superomedially superficial to the arthrotomy.  Once the cement was hard, the tourniquet was let down.  Hemostasis was obtained.  The arthrotomy was closed with figure-8 #1 vicryl sutures.  The deep soft tissues were closed with #0 vicryls and the subcuticular layer closed with a running #2-0 vicryl.  The skin was reapproximated and closed with skin staples.  The wound was dressed with xeroform, 4 x4's, 2 ABD sponges, a single layer of webril and a TED stocking.   The patient was then awakened, extubated, and taken to the recovery room in stable condition.  BLOOD LOSS:  300cc DRAINS: 1 hemovac, 1 pain catheter COMPLICATIONS:  None.  PLAN OF CARE: Admit to inpatient   PATIENT DISPOSITION:  PACU - hemodynamically stable.   Delay start of Pharmacological VTE agent (>24hrs) due to surgical  blood loss or risk of bleeding:  not applicable  Please fax a copy of this op note to my office at [phone] (please only include page 1 and 2 of the Case Information op note)

## 2013-07-27 NOTE — Clinical Social Work Placement (Addendum)
Clinical Social Work Department  CLINICAL SOCIAL WORK PLACEMENT NOTE   Patient: ABERDEEN HAFEN Account Number: [id number] Admit date: 07/26/13 Clinical Social Worker: Sabino Niemann LCSWA Date/time: 07/27/2013 11:30 AM  Clinical Social Work is seeking post-discharge placement for this patient at the following level of care: SKILLED NURSING (*CSW will update this form in Epic as items are completed)  07/27/2013 Patient/family provided with [facility] Department of Clinical Social Work's list of facilities offering this level of care within the geographic area requested by the patient (or if unable, by the patient's family).  12/3/2014Patient/family informed of their freedom to choose among providers that offer the needed level of care, that participate in Medicare, Medicaid or managed care program needed by the patient, have an available bed and are willing to accept the patient.  07/27/2013 Patient/family informed of [facility]' ownership interest in [facility], as well as of the fact that they are under no obligation to receive care at this facility.  PASARR submitted to EDS on   PASARR number received from EDS on  FL2 transmitted to all facilities in geographic area requested by pt/family on 07/27/2013  FL2 transmitted to all facilities within larger geographic area on  Patient informed that his/her managed care company has contracts with or will negotiate with certain facilities, including the following:  Patient/family informed of bed offers received:  Patient chooses bed at  Physician recommends and patient chooses bed at  Patient to be transferred to on  Patient to be transferred to facility by  The following physician request were entered in Epic:  Additional Comments:

## 2013-07-27 NOTE — Progress Notes (Signed)
Orthopedic Tech Progress Note Patient Details:  Sonya Allen 1956-05-12 [id number] On cpm at 8:10 pm LLE 0-50  Patient ID: Sonya Allen, female   DOB: 18-Jul-1956, 57 y.o.   MRN: [id number]   Sonya Allen 07/27/2013, 8:09 PM

## 2013-07-27 NOTE — Progress Notes (Signed)
Physical Therapy Treatment Patient Details Name: Sonya Allen MRN: [id number] DOB: 09-19-1955 Today's Date: 07/27/2013 Time: 1016-1040 PT Time Calculation (min): 24 min  PT Assessment / Plan / Recommendation  History of Present Illness Patient is a 57 yo female s/p Lt TKA.  Patient with h/o mult back surgeries, Rt rotator cuff injury, Rt ankle fusion.   PT Comments   Pt progressing very slowly towards physical therapy goals. Pain management continues to be an issue, even with receiving pain medication prior to therapy session. Pain is limiting ROM and tolerance for functional activity. Spoke with pt regarding d/c to SNF for STR prior to returning home, as husband is only going to be available 24 hrs for a short time (possibly until the end of the week?). She is agreeable, however wants to speak with her husband prior to making any final decisions.   Follow Up Recommendations  SNF     Does the patient have the potential to tolerate intense rehabilitation     Barriers to Discharge        Equipment Recommendations  None recommended by PT    Recommendations for Other Services    Frequency 7X/week   Progress towards PT Goals Progress towards PT goals: Progressing toward goals  Plan Discharge plan needs to be updated    Precautions / Restrictions Precautions Precautions: Knee;Fall Restrictions Weight Bearing Restrictions: Yes LLE Weight Bearing: Weight bearing as tolerated   Pertinent Vitals/Pain 10/10 pain at end of session. Nursing notified.    Mobility  Bed Mobility Bed Mobility: Not assessed Supine to Sit: With rails;HOB elevated;6: Modified independent (Device/Increase time);5: Supervision Sitting - Scoot to Edge of Bed: 6: Modified independent (Device/Increase time);With rail Details for Bed Mobility Assistance: Pt received sitting up in chair Transfers Transfers: Sit to Stand;Stand to Sit Sit to Stand: 4: Min guard;From chair/3-in-1;With upper extremity assist Stand to  Sit: 4: Min guard;To chair/3-in-1;With upper extremity assist Details for Transfer Assistance: Increased time required to come to full stand due to pain Ambulation/Gait Ambulation/Gait Assistance: 4: Min guard Ambulation Distance (Feet): 30 Feet Assistive device: Rolling walker Ambulation/Gait Assistance Details: VC's for sequencing and technique, including increased heel strike and increased step-length on RLE. Pt tearful during gait training due to pain, and appeared very fatigued at end of ambulation.  Gait Pattern: Step-to pattern;Decreased stride length;Narrow base of support;Trunk flexed Gait velocity: Decreased Stairs: No    Exercises Total Joint Exercises Ankle Circles/Pumps: 10 reps Quad Sets: 10 reps Heel Slides: 10 reps;AAROM Hip ABduction/ADduction: 10 reps;AAROM Goniometric ROM: 63 AAROM   PT Diagnosis:    PT Problem List:   PT Treatment Interventions:     PT Goals (current goals can now be found in the care plan section) Acute Rehab PT Goals Patient Stated Goal: Decreased pain PT Goal Formulation: With patient Time For Goal Achievement: 08/01/13 Potential to Achieve Goals: Good  Visit Information  Last PT Received On: 07/27/13 Assistance Needed: +1 History of Present Illness: Patient is a 57 yo female s/p Lt TKA.  Patient with h/o mult back surgeries, Rt rotator cuff injury, Rt ankle fusion.    Subjective Data  Subjective: "I am in so much pain." Patient Stated Goal: Decreased pain   Cognition  Cognition Arousal/Alertness: Awake/alert Behavior During Therapy: WFL for tasks assessed/performed Overall Cognitive Status: Within Functional Limits for tasks assessed    Balance  Balance Balance Assessed: Yes Static Sitting Balance Static Sitting - Balance Support: No upper extremity supported;Feet supported Static Sitting - Level of Assistance:  7: Independent Static Sitting - Comment/# of Minutes: Static Standing Balance Static Standing - Balance  Support: Left upper extremity supported;During functional activity (Standing at sink to brush teeth & grooming tasks) Static Standing - Level of Assistance: 5: Stand by assistance Static Standing - Comment/# of Minutes: 10 min  End of Session PT - End of Session Equipment Utilized During Treatment: Gait belt Activity Tolerance: Patient limited by pain Patient left: in chair;with call bell/phone within reach Nurse Communication: Mobility status   GP     Ruthann Cancer 07/27/2013, 12:23 PM  Ruthann Cancer, PT, DPT [phone]

## 2013-07-28 LAB — CBC
HCT: 25.7 % — ABNORMAL LOW (ref 36.0–46.0)
Hemoglobin: 8.8 g/dL — ABNORMAL LOW (ref 12.0–15.0)
MCH: 32.1 pg (ref 26.0–34.0)
MCHC: 34.2 g/dL (ref 30.0–36.0)
MCV: 93.8 fL (ref 78.0–100.0)
Platelets: 115 10*3/uL — ABNORMAL LOW (ref 150–400)
RBC: 2.74 MIL/uL — ABNORMAL LOW (ref 3.87–5.11)
RDW: 12.7 % (ref 11.5–15.5)
WBC: 6.3 10*3/uL (ref 4.0–10.5)

## 2013-07-28 MED ORDER — OXYCODONE HCL 30 MG PO TABS: 30.0000 mg | ORAL_TABLET | Freq: Three times a day (TID) | ORAL | Status: DC | PRN

## 2013-07-28 NOTE — Progress Notes (Signed)
Physical Therapy Treatment Patient Details Name: Sonya Allen MRN: [id number] DOB: 1955-11-12 Today's Date: 07/28/2013 Time: 4098-1191 PT Time Calculation (min): 38 min  PT Assessment / Plan / Recommendation  History of Present Illness Patient is a 57 yo female s/p Lt TKA.  Patient with h/o mult back surgeries, Rt rotator cuff injury, Rt ankle fusion.   PT Comments   Pt showed improvement during physical therapy session today. She continues to be limited by pain, however was able to ambulate increased distance and negotiate 6 steps total. Throughout session pt was tearful and there were times when she was not verbally responding to therapist when trying to discuss home situation and what will be expected of her between home health PT visits. At end of session pt reports she does not have any concerns returning home.   Follow Up Recommendations  Home health PT     Does the patient have the potential to tolerate intense rehabilitation     Barriers to Discharge        Equipment Recommendations  3in1 (PT)    Recommendations for Other Services    Frequency 7X/week   Progress towards PT Goals Progress towards PT goals: Progressing toward goals  Plan Current plan remains appropriate    Precautions / Restrictions Precautions Precautions: Knee;Fall Restrictions Weight Bearing Restrictions: Yes LLE Weight Bearing: Weight bearing as tolerated   Pertinent Vitals/Pain Pt reports increased pain after stair training, pt repositioned in chair for comfort.     Mobility  Bed Mobility Bed Mobility: Not assessed Details for Bed Mobility Assistance:  (Pt received sitting up in chair) Transfers Transfers: Sit to Stand;Stand to Sit Sit to Stand: 4: Min guard;From chair/3-in-1;With upper extremity assist Stand to Sit: 4: Min guard;To chair/3-in-1;With upper extremity assist Details for Transfer Assistance: Pt demonstrated proper hand placement; increased time required to come to full stand due  to pain. Ambulation/Gait Ambulation/Gait Assistance: 4: Min guard Ambulation Distance (Feet): 75 Feet Assistive device: Rolling walker Ambulation/Gait Assistance Details: VC's for sequencing and technique. Pt made little corrective changes and did not respond to questions during this time. Possibly due to pain, but unclear why this is. Gait Pattern: Step-to pattern;Decreased stride length;Narrow base of support;Trunk flexed Gait velocity: Decreased Stairs: Yes Stairs Assistance: 4: Min guard Stairs Assistance Details (indicate cue type and reason): Frequent VC's for sequencing and safe hand placement. Husband present for family education, and pt was able to negotiate 2 steps ascending and descending 3 times.  Stair Management Technique: Backwards;With walker Number of Stairs: 6    Exercises Total Joint Exercises Quad Sets: 10 reps Heel Slides: 15 reps Long Arc Quad: 15 reps Goniometric ROM: 5-72   PT Diagnosis:    PT Problem List:   PT Treatment Interventions:     PT Goals (current goals can now be found in the care plan section) Acute Rehab PT Goals Patient Stated Goal: Decreased pain PT Goal Formulation: With patient Time For Goal Achievement: 08/01/13 Potential to Achieve Goals: Good  Visit Information  Last PT Received On: 07/28/13 Assistance Needed: +1 History of Present Illness: Patient is a 57 yo female s/p Lt TKA.  Patient with h/o mult back surgeries, Rt rotator cuff injury, Rt ankle fusion.    Subjective Data  Subjective: "I guess I can go home." Patient Stated Goal: Decreased pain   Cognition  Cognition Arousal/Alertness: Awake/alert Behavior During Therapy: WFL for tasks assessed/performed Overall Cognitive Status: Within Functional Limits for tasks assessed    Balance  End of Session PT - End of Session Equipment Utilized During Treatment: Gait belt Activity Tolerance: Patient limited by pain Patient left: in chair;with call bell/phone within  reach Nurse Communication: Mobility status CPM Left Knee CPM Left Knee: Off   GP     Ruthann Cancer 07/28/2013, 10:43 AM  Ruthann Cancer, PT, DPT [phone]

## 2013-07-28 NOTE — Discharge Summary (Signed)
SPORTS MEDICINE & JOINT REPLACEMENT   Georgena Spurling, MD   Altamese Cabal, PA-C [address] [place], [place], [place]  [address]                             [phone]  PATIENT ID: Sonya Allen        MRN:  [id number]          DOB/AGE: 58-Jan-1957 / 58 y.o.    DISCHARGE SUMMARY  ADMISSION DATE:    07/25/2013 DISCHARGE DATE:   07/28/2013   ADMISSION DIAGNOSIS: osteoarthritis left knee    DISCHARGE DIAGNOSIS:  osteoarthritis left knee    ADDITIONAL DIAGNOSIS: Active Problems:   S/P total knee arthroplasty  Past Medical History  Diagnosis Date  . PONV (postoperative nausea and vomiting)   . Bronchitis     hx of  . Constipation due to pain medication   . History of kidney stones   . Arthritis   . Hypercholesteremia   . Edema of extremities     Takes Lasix  . Bilateral dry eyes     Takes Systane Drops  . Fibromyalgia   . Restless leg syndrome     Takes Klonopin    PROCEDURE: Procedure(s): TOTAL KNEE ARTHROPLASTY on 07/25/2013  CONSULTS:     HISTORY:  See H&P in chart  HOSPITAL COURSE:  Sonya Allen is a 57 y.o. admitted on 07/25/2013 and found to have a diagnosis of osteoarthritis left knee.  After appropriate laboratory studies were obtained  they were taken to the operating room on 07/25/2013 and underwent Procedure(s): TOTAL KNEE ARTHROPLASTY.   They were given perioperative antibiotics:  Anti-infectives   Start     Dose/Rate Route Frequency Ordered Stop   07/25/13 1430  ceFAZolin (ANCEF) IVPB 2 g/50 mL premix     2 g 100 mL/hr over 30 Minutes Intravenous 4 times per day 07/25/13 1348 07/25/13 2023   07/25/13 1345  vancomycin (VANCOCIN) IVPB 1000 mg/200 mL premix  Status:  Discontinued     1,000 mg 200 mL/hr over 60 Minutes Intravenous Every 12 hours 07/25/13 1334 07/25/13 1345   07/25/13 0600  ceFAZolin (ANCEF) IVPB 2 g/50 mL premix     2 g 100 mL/hr over 30 Minutes Intravenous On call to O.R. 07/24/13 1409 07/25/13 0908    .  Tolerated the procedure  well.  Placed with a foley intraoperatively.  Given Ofirmev at induction and for 48 hours.    POD# 1: Vital signs were stable.  Patient denied Chest pain, shortness of breath, or calf pain.  Patient was started on Lovenox 30 mg subcutaneously twice daily at 8am.  Consults to PT, OT, and care management were made.  The patient was weight bearing as tolerated.  CPM was placed on the operative leg 0-90 degrees for 6-8 hours a day.  Incentive spirometry was taught.  Dressing was changed.  Marcaine pump and hemovac were discontinued.      POD #2, Continued  PT for ambulation and exercise program.  IV saline locked.  O2 discontinued.    The remainder of the hospital course was dedicated to ambulation and strengthening.   The patient was discharged on 3 Days Post-Op in  Good condition.  Blood products given:none  DIAGNOSTIC STUDIES: Recent vital signs: Patient Vitals for the past 24 hrs:  BP Temp Temp src Pulse Resp SpO2  07/28/13 1111 - - - - 18 100 %  07/28/13 0800 - - - -  18 100 %  07/28/13 0620 105/66 mmHg 99.2 F (37.3 C) Oral 90 18 99 %  07/28/13 0028 - 99.2 F (37.3 C) Oral - - -  07/27/13 2206 123/63 mmHg 99.5 F (37.5 C) Oral 97 18 100 %  07/27/13 1452 119/57 mmHg 98.4 F (36.9 C) Oral 82 18 -       Recent laboratory studies:  Recent Labs  07/25/13 1400 07/26/13 0500 07/27/13 0418 07/28/13 0435  WBC 8.3 8.3 6.3 6.3  HGB 11.9* 10.9* 8.9* 8.8*  HCT 34.0* 32.2* 26.1* 25.7*  PLT 149* 132* 114* 115*    Recent Labs  07/25/13 1400 07/26/13 0500  NA  --  140  K  --  3.6  CL  --  104  CO2  --  27  BUN  --  8  CREATININE 0.59 0.53  GLUCOSE  --  122*  CALCIUM  --  8.1*   Lab Results  Component Value Date   INR 1.04 07/15/2013     Recent Radiographic Studies :  Dg Chest 2 View  07/15/2013   CLINICAL DATA:  Preop left total knee arthroplasty  EXAM: CHEST  2 VIEW  COMPARISON:  12/21/2009  FINDINGS: Lungs are clear.  No pleural effusion or pneumothorax.  The heart is  normal in size.  Mild degenerative changes of the visualized thoracolumbar spine.  Cervical and lumbar spine fixation hardware, incompletely visualized.  IMPRESSION: No evidence of acute cardiopulmonary disease.   Electronically Signed   By: Charline Bills M.D.   On: 07/15/2013 15:43    DISCHARGE INSTRUCTIONS: Discharge Orders   Future Orders Complete By Expires   Call MD / Call 911  As directed    Comments:     If you experience chest pain or shortness of breath, CALL 911 and be transported to the hospital emergency room.  If you develope a fever above 101 F, pus (white drainage) or increased drainage or redness at the wound, or calf pain, call your surgeon's office.   Change dressing  As directed    Comments:     Change dressing on friday, then change the dressing daily with sterile 4 x 4 inch gauze dressing and apply TED hose.   Constipation Prevention  As directed    Comments:     Drink plenty of fluids.  Prune juice may be helpful.  You may use a stool softener, such as Colace (over the counter) 100 mg twice a day.  Use MiraLax (over the counter) for constipation as needed.   CPM  As directed    Comments:     Continuous passive motion machine (CPM):      Use the CPM from 0 to 90 for 6-8 hours per day.      You may increase by 10 per day.  You may break it up into 2 or 3 sessions per day.      Use CPM for 2 weeks or until you are told to stop.   Diet - low sodium heart healthy  As directed    Do not put a pillow under the knee. Place it under the heel.  As directed    Driving restrictions  As directed    Comments:     No driving for 6 weeks   Increase activity slowly as tolerated  As directed    Lifting restrictions  As directed    Comments:     No lifting for 6 weeks   TED hose  As directed  Comments:     Use stockings (TED hose) for 3 weeks on both leg(s).  You may remove them at night for sleeping.      DISCHARGE MEDICATIONS:     Medication List    STOP taking  these medications       fish oil-omega-3 fatty acids 1000 MG capsule      TAKE these medications       carisoprodol 350 MG tablet  Commonly known as:  SOMA  Take 350 mg by mouth 3 (three) times daily.     clonazePAM 0.5 MG tablet  Commonly known as:  KLONOPIN  Take 0.5 mg by mouth at bedtime. Restless legs     docusate sodium 100 MG capsule  Commonly known as:  COLACE  Take 100 mg by mouth daily.     enoxaparin 40 MG/0.4ML injection  Commonly known as:  LOVENOX  Inject 0.4 mLs (40 mg total) into the skin daily.     furosemide 20 MG tablet  Commonly known as:  LASIX  Take 20 mg by mouth daily.     methocarbamol 500 MG tablet  Commonly known as:  ROBAXIN  Take 1-2 tablets (500-1,000 mg total) by mouth every 6 (six) hours as needed for muscle spasms.     morphine 60 MG 12 hr tablet  Commonly known as:  MS CONTIN  Take 60 mg by mouth every 12 (twelve) hours.     oxycodone 30 MG immediate release tablet  Commonly known as:  ROXICODONE  Take 1 tablet (30 mg total) by mouth every 8 (eight) hours as needed (for breakthrough pain).     pravastatin 40 MG tablet  Commonly known as:  PRAVACHOL  Take 40 mg by mouth daily.     pregabalin 150 MG capsule  Commonly known as:  LYRICA  Take 150 mg by mouth daily.     SYSTANE OP  Apply 1 drop to eye 2 (two) times daily as needed (dry eyes).     venlafaxine XR 150 MG 24 hr capsule  Commonly known as:  EFFEXOR-XR  Take 150 mg by mouth daily.     VITAMIN D-3 PO  Take 1 capsule by mouth daily.        FOLLOW UP VISIT:       Follow-up Information   Follow up with Raymon Mutton, MD. Call on 08/09/2013.   Specialty:  Orthopedic Surgery   Contact information:   [address] [place] [address] [phone]       DISPOSITION: HOME   CONDITION:  Good   Sonya Allen 07/28/2013, 12:21 PM

## 2013-07-28 NOTE — Progress Notes (Signed)
Pt discharged to home. D/c instructions given. No questions verbalized. Vitals stable. 

## 2013-07-28 NOTE — Progress Notes (Signed)
SPORTS MEDICINE AND JOINT REPLACEMENT  Georgena Spurling, MD   Altamese Cabal, PA-C [address] [place], [place], [place]  [address]                             [phone]   PROGRESS NOTE  Subjective:  negative for Chest Pain  negative for Shortness of Breath  negative for Nausea/Vomiting   negative for Calf Pain  negative for Bowel Movement   Tolerating Diet: yes         Patient reports pain as 6 on 0-10 scale.    Objective: Vital signs in last 24 hours:   Patient Vitals for the past 24 hrs:  BP Temp Temp src Pulse Resp SpO2  07/28/13 1111 - - - - 18 100 %  07/28/13 0800 - - - - 18 100 %  07/28/13 0620 105/66 mmHg 99.2 F (37.3 C) Oral 90 18 99 %  07/28/13 0028 - 99.2 F (37.3 C) Oral - - -  07/27/13 2206 123/63 mmHg 99.5 F (37.5 C) Oral 97 18 100 %  07/27/13 1452 119/57 mmHg 98.4 F (36.9 C) Oral 82 18 -    @flow {1959:LAST@   Intake/Output from previous day:   12/03 0701 - 12/04 0700 In: 1005 [P.O.:720; I.V.:285] Out: -    Intake/Output this shift:   12/04 0701 - 12/04 1900 In: 240 [P.O.:240] Out: -    Intake/Output     12/03 0701 - 12/04 0700 12/04 0701 - 12/05 0700   P.O. 720 240   I.V. (mL/kg) 285 (3.4)    Total Intake(mL/kg) 1005 (12.1) 240 (2.9)   Net +1005 +240        Urine Occurrence 6 x       LABORATORY DATA:  Recent Labs  07/25/13 1400 07/26/13 0500 07/27/13 0418 07/28/13 0435  WBC 8.3 8.3 6.3 6.3  HGB 11.9* 10.9* 8.9* 8.8*  HCT 34.0* 32.2* 26.1* 25.7*  PLT 149* 132* 114* 115*    Recent Labs  07/25/13 1400 07/26/13 0500  NA  --  140  K  --  3.6  CL  --  104  CO2  --  27  BUN  --  8  CREATININE 0.59 0.53  GLUCOSE  --  122*  CALCIUM  --  8.1*   Lab Results  Component Value Date   INR 1.04 07/15/2013    Examination:  General appearance: alert, cooperative and no distress Extremities: Homans sign is negative, no sign of DVT  Wound Exam: clean, dry, intact   Drainage:  None: wound tissue dry  Motor Exam: EHL and FHL  Intact  Sensory Exam: Deep Peroneal normal   Assessment:    3 Days Post-Op  Procedure(s) (LRB): TOTAL KNEE ARTHROPLASTY (Left)  ADDITIONAL DIAGNOSIS:  Active Problems:   S/P total knee arthroplasty  Acute Blood Loss Anemia   Plan: Physical Therapy as ordered Weight Bearing as Tolerated (WBAT)  DVT Prophylaxis:  Lovenox  DISCHARGE PLAN: Home  DISCHARGE NEEDS: HHPT, CPM, Walker and 3-in-1 comode seat         Pebbles Zeiders 07/28/2013, 12:18 PM

## 2013-07-29 NOTE — Progress Notes (Signed)
07/29/13 Set up with HHPT and HHOT with [facility]. [employer] provided rolling walker, 3N1 and CPM. Jacquelynn Cree RN, BSN, CCM

## 2013-09-23 DIAGNOSIS — M5417 Radiculopathy, lumbosacral region: Secondary | ICD-10-CM | POA: Insufficient documentation

## 2013-09-23 HISTORY — DX: Radiculopathy, lumbosacral region: M54.17

## 2013-11-30 ENCOUNTER — Other Ambulatory Visit: Payer: Self-pay | Admitting: Pain Medicine

## 2013-11-30 DIAGNOSIS — M545 Low back pain, unspecified: Secondary | ICD-10-CM

## 2013-11-30 DIAGNOSIS — IMO0002 Reserved for concepts with insufficient information to code with codable children: Secondary | ICD-10-CM

## 2013-12-03 ENCOUNTER — Ambulatory Visit
Admission: RE | Admit: 2013-12-03 | Discharge: 2013-12-03 | Disposition: A | Discharge status: Home / Self Care | Payer: BC Managed Care – PPO | Source: Ambulatory Visit | Attending: Pain Medicine | Admitting: Pain Medicine

## 2013-12-03 DIAGNOSIS — M545 Low back pain, unspecified: Secondary | ICD-10-CM

## 2013-12-03 DIAGNOSIS — IMO0002 Reserved for concepts with insufficient information to code with codable children: Secondary | ICD-10-CM

## 2013-12-03 MED ORDER — GADOBENATE DIMEGLUMINE 529 MG/ML IV SOLN
16.0000 mL | Freq: Once | INTRAVENOUS | Status: AC | PRN
  Administered 2013-12-03: 16 mL via INTRAVENOUS

## 2014-01-23 DIAGNOSIS — M25561 Pain in right knee: Secondary | ICD-10-CM

## 2014-01-23 HISTORY — DX: Pain in right knee: M25.561

## 2014-03-30 ENCOUNTER — Ambulatory Visit: Payer: Self-pay

## 2014-04-06 ENCOUNTER — Ambulatory Visit: Payer: Self-pay

## 2014-04-13 ENCOUNTER — Ambulatory Visit: Payer: Self-pay

## 2014-04-27 ENCOUNTER — Ambulatory Visit (INDEPENDENT_AMBULATORY_CARE_PROVIDER_SITE_OTHER): Payer: BC Managed Care – PPO

## 2014-04-27 VITALS — BP 112/67 | HR 70 | Resp 18

## 2014-04-27 DIAGNOSIS — Q828 Other specified congenital malformations of skin: Secondary | ICD-10-CM

## 2014-04-27 DIAGNOSIS — R52 Pain, unspecified: Secondary | ICD-10-CM

## 2014-04-27 DIAGNOSIS — M19079 Primary osteoarthritis, unspecified ankle and foot: Secondary | ICD-10-CM

## 2014-04-27 DIAGNOSIS — M19071 Primary osteoarthritis, right ankle and foot: Secondary | ICD-10-CM

## 2014-04-27 DIAGNOSIS — M778 Other enthesopathies, not elsewhere classified: Secondary | ICD-10-CM

## 2014-04-27 DIAGNOSIS — M779 Enthesopathy, unspecified: Secondary | ICD-10-CM

## 2014-04-27 DIAGNOSIS — M7750 Other enthesopathy of unspecified foot: Secondary | ICD-10-CM

## 2014-04-27 DIAGNOSIS — R269 Unspecified abnormalities of gait and mobility: Secondary | ICD-10-CM

## 2014-04-27 NOTE — Patient Instructions (Signed)

## 2014-04-27 NOTE — Progress Notes (Signed)
   Subjective:    Patient ID: Sonya Allen, female    DOB: 1956-06-03, 58 y.o.   MRN: [id number]  HPI MY RIGHT FOOT IS HURTING ON THE BALL AND THERE IS NO SWELLING AND SORE AND TENDER AND REALLY HAS HURT BAD FOR ABOUT 3 MONTHS AND IT DOES BURN AND THROB AND DR Ralene Cork DID DO SURGERY ON MY ANKLE A LONG TIME AGO AND I HAD SURGERY DONE ON MY RIGHT FOOT BUT DR Ralene Cork DIDN'T DO THAT ONE    Review of Systems  All other systems reviewed and are negative.      Objective:   Physical Exam 58 year old white female well-developed well-nourished oriented x3 presents at this time for further followup does have significant arthropathy of her right foot and ankle has had multiple surgeries including ankle subtalar fusions and midtarsal fusions are Lisfranc fusion with Lapidus-type correction. Has now developed some atrophy the plantar fat pad and callusing painful lesions in the right forefoot sub-first and second MTP area in particular. Does have some adjustments for gait walks with rocker soled shoes for the most time however not at all times is going wearing some PPT Plastizote type insole that refurbished her more than a year ago that helped however the current ones worn need replacing she still has a new one that has been used at home. Patient is a fascia the fat pad secondary to her arthropathy significant x-rays reveal significant osteopenic changes throughout the forefoot midfoot and ankle and leg intact fixation with a rod in the tibia ankle and subtalar fusion as well as midtarsal and Lisfranc fusions are noted patient has a rigid foot with abnormality gait is to maintain a stiff site soled shoe or stiff shank sure the rocker-bottom preferably and continue with accommodative orthoses. No open wounds no ulcers no secondary infections does have keratoses sub-12 right patient absent hair growth and skin texture and turgor pedal pulses palpable DP postal for PT one over 4 bilateral capillary refill time 3  seconds all digits no other significant findings no       Assessment & Plan:  Assessment this time is significant osteoarthritis with deformity of the foot and abnormality gait plantar fat pad atrophy and plantarflexed first and second metatarsals right foot. At this time keratoses for these areas are debrided recommended 18 Plastizote and PPT insoles orthotics and shoes recommend a stiff shoe currently her shoes very flimsy are flexible again recommend rocker sole are firm shoe if not rocker. Followup in 3-6 months for possible future palliative care on an as-needed basis invasive or surgical options would be a last resort vascular duplex presents in a good alternative as she has multiple fusions and this is just a compensatory gait change complication.  Alvan Dame DPM

## 2014-08-28 ENCOUNTER — Ambulatory Visit (INDEPENDENT_AMBULATORY_CARE_PROVIDER_SITE_OTHER): Payer: BLUE CROSS/BLUE SHIELD

## 2014-08-28 DIAGNOSIS — R52 Pain, unspecified: Secondary | ICD-10-CM

## 2014-08-28 DIAGNOSIS — S96911S Strain of unspecified muscle and tendon at ankle and foot level, right foot, sequela: Secondary | ICD-10-CM

## 2014-08-28 DIAGNOSIS — M778 Other enthesopathies, not elsewhere classified: Secondary | ICD-10-CM

## 2014-08-28 DIAGNOSIS — M7751 Other enthesopathy of right foot: Secondary | ICD-10-CM

## 2014-08-28 DIAGNOSIS — M19071 Primary osteoarthritis, right ankle and foot: Secondary | ICD-10-CM

## 2014-08-28 DIAGNOSIS — M779 Enthesopathy, unspecified: Secondary | ICD-10-CM

## 2014-08-28 DIAGNOSIS — M76811 Anterior tibial syndrome, right leg: Secondary | ICD-10-CM

## 2014-08-28 MED ORDER — PREDNISONE 10 MG PO KIT: PACK | ORAL | Status: DC

## 2014-08-28 NOTE — Progress Notes (Signed)
   Subjective:    Patient ID: LILLYANA MAJETTE, female    DOB: Oct 25, 1955, 59 y.o.   MRN: [id number]  HPI NEW PROBLEM  PT STATED HAVE SURGERY HISTORY TOP OF THE FOOT AND ANKLE AND TOP OF THE FOOT A LITTLE KNOT AND IS BEEN SORE FOR 2 MONTHS. FOOT IS GETTING WORSE ESPECIALLY WHEN WALKING AND STANDING. TRIED NO TREATMENT.  Review of Systems  Musculoskeletal: Positive for gait problem.  All other systems reviewed and are negative.      Objective:   Physical Exam 59 year old white female well-developed well-nourished oriented 3 presents at this time with a 3 month history of pain points to the anterior medial right ankle area patient's had previous ankle subtalar pan talar fusion of the right foot with rod placement patient is also had isolated Lisfranc's or Lapidus type fusion of the forefoot done by me sometime in the recent past. The pain is actually not in the Lapidus site is more over the tibialis anterior or talonavicular and naviculocuneiform site with any attempted inversion or eversion the ankle is relatively fixed infuse as is the Lisfranc's although clinically there may be some lucency of either arthrosis or incomplete fusion noted patient having pain with walking standing or activities on palpation over the tibialis anterior insertion site anterior medial of the right ankle. X-rays otherwise unremarkable intact fixations noted from previous surgeries no new fractures or cysts or tumors noted no bony spurs are identified. Yehuda Budd be more of a soft tissue injury or strain of the mid tarsus joint talonavicular naviculocuneiform and possibly some tibialis anterior tendinitis. Remainder the exam unremarkable patient is had a successful fusions foot is rectus maintaining good stable shoes at all times.       Assessment & Plan:  Assessment capsulitis and severe osteoarthropathy of the right foot and ankle patient has status post ankle sprain/strain due to chronic arthrosis and nonfunctional the  ankle and mid tarsus joints. Plan at this time patient placed on a Sterapred Deas dosepak 12 days also recommend warm compress ice pack or hot cold compresses to help with inflammation the area recheck within the next 2-3 weeks if no improvement maintain a stable shoe and ankle stabilizer is also dispensed at this time help with stability the ankle and mid tarsus joint. Avoid any ballistic activities or uneven surfaces or any new activities. Patient's arthropathy likely flaring up at this season cold wet rainy season has produced likely flareup of arthropathy as well. We'll reassess as needed based on improvement with medications or failure to improve may indicate more invasive options.  Alvan Dame DPM

## 2014-08-28 NOTE — Patient Instructions (Signed)
ICE INSTRUCTIONS  Apply ice or cold pack to the affected area at least 3 times a day for 10-15 minutes each time.  You should also use ice after prolonged activity or vigorous exercise.  Do not apply ice longer than 20 minutes at one time.  Always keep a cloth between your skin and the ice pack to prevent burns.  Being consistent and following these instructions will help control your symptoms.  We suggest you purchase a gel ice pack because they are reusable and do bit leak.  Some of them are designed to wrap around the area.  Use the method that works best for you.  Here are some other suggestions for icing.   Use a frozen bag of peas or corn-inexpensive and molds well to your body, usually stays frozen for 10 to 20 minutes.  Wet a towel with cold water and squeeze out the excess until it's damp.  Place in a bag in the freezer for 20 minutes. Then remove and use.   Alternate applying a hot compress such as a hot towel for 10 minutes then ice pack for 10 minutes repeat heat and ice, heat and ice 2 or 3 times every evening

## 2015-10-24 ENCOUNTER — Encounter: Payer: Self-pay | Admitting: Sports Medicine

## 2015-10-24 ENCOUNTER — Ambulatory Visit (INDEPENDENT_AMBULATORY_CARE_PROVIDER_SITE_OTHER): Payer: BLUE CROSS/BLUE SHIELD | Admitting: Sports Medicine

## 2015-10-24 DIAGNOSIS — M216X9 Other acquired deformities of unspecified foot: Secondary | ICD-10-CM | POA: Diagnosis not present

## 2015-10-24 DIAGNOSIS — M79672 Pain in left foot: Secondary | ICD-10-CM | POA: Diagnosis not present

## 2015-10-24 DIAGNOSIS — Q828 Other specified congenital malformations of skin: Secondary | ICD-10-CM | POA: Diagnosis not present

## 2015-10-24 NOTE — Progress Notes (Signed)
Patient ID: Sonya Allen, female   DOB: 1955/11/08, 60 y.o.   MRN: [id number] Subjective: Sonya Allen is a 60 y.o. female patient who presents to office for evaluation of Left foot pain. Patient complains of pain at the lesion present Left foot at the ball. Patient states that she has tried a callus scraper, pads, change of shoes and inserts with little to no relief; wanted to get the area checked because this has been becoming bothersome over the last 6 months. Denies any injury or trauma that could've started this lesion. Patient denies any other pedal complaints.   Patient Active Problem List   Diagnosis Date Noted  . 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 betamethasone acetate-betamethasone sodium phosphate (CELESTONE) 6 (3-3) MG/ML injection Inject 12 mg into the articular space.    . bupivacaine, PF, (MARCAINE) 0.25 % SOLN injection Inject 25 mg into the skin.    . carisoprodol (SOMA) 350 MG tablet Take 350 mg by mouth 3 (three) times daily.    . carisoprodol (SOMA) 350 MG tablet     . Cholecalciferol (VITAMIN D-3 PO) Take 1 capsule by mouth daily.    . clonazePAM (KLONOPIN) 0.5 MG tablet Take 0.5 mg by mouth at bedtime. Restless legs    . clonazePAM (KLONOPIN) 0.5 MG tablet     . conjugated estrogens (PREMARIN) vaginal cream     . docusate sodium (COLACE) 100 MG capsule Take 100 mg by mouth daily.    Marland Kitchen enoxaparin (LOVENOX) 40 MG/0.4ML injection Inject 0.4 mLs (40 mg total) into the skin daily. 12 Syringe 0  . furosemide (LASIX) 20 MG tablet Take 20 mg by mouth daily.    . furosemide (LASIX) 20 MG tablet     . GRALISE 600 MG TABS     . HYDROmorphone HCl (EXALGO) 12 MG T24A SR tablet     . Hylan 48 MG/6ML SOSY Inject 48 mg into the articular space.    Marland Kitchen ibuprofen (ADVIL,MOTRIN) 800 MG tablet     . methocarbamol (ROBAXIN) 500 MG tablet Take 1-2 tablets (500-1,000 mg total) by mouth every 6  (six) hours as needed for muscle spasms. 60 tablet 2  . morphine (MS CONTIN) 30 MG 12 hr tablet Take by mouth.    . morphine (MS CONTIN) 60 MG 12 hr tablet Take 60 mg by mouth every 12 (twelve) hours.    Marland Kitchen MOVANTIK 12.5 MG TABS     . NUVIGIL 150 MG tablet     . ondansetron (ZOFRAN) 4 MG tablet     . ondansetron (ZOFRAN) 4 MG tablet     . oxycodone (ROXICODONE) 30 MG immediate release tablet Take 1 tablet (30 mg total) by mouth every 8 (eight) hours as needed (for breakthrough pain). 100 tablet 0  . Polyethyl Glycol-Propyl Glycol (SYSTANE OP) Apply 1 drop to eye 2 (two) times daily as needed (dry eyes).    . pravastatin (PRAVACHOL) 40 MG tablet Take 40 mg by mouth daily.    . pravastatin (PRAVACHOL) 40 MG tablet Take by mouth.    . PredniSONE 10 MG KIT Take as instructed for 12 days and a tapering dose 48 each 0  . pregabalin (LYRICA) 150 MG capsule Take 150 mg by mouth daily.    Marland Kitchen tiZANidine (ZANAFLEX) 4 MG tablet     . venlafaxine XR (EFFEXOR-XR) 150 MG 24 hr capsule Take 150 mg by mouth  daily.    . venlafaxine XR (EFFEXOR-XR) 150 MG 24 hr capsule     . Vitamin D, Ergocalciferol, (DRISDOL) 50000 UNITS CAPS capsule     . ZETIA 10 MG tablet      No current facility-administered medications on file prior to visit.    Allergies  Allergen Reactions  . Augmentin [Amoxicillin-Pot Clavulanate] Diarrhea    Objective:  General: Alert and oriented x3 in no acute distress  Dermatology: Keratotic lesion present measuring <0.5 cm with skin lines transversing the lesion, pain is present with direct pressure to the lesion, central nucleated core noted suggestive of porokeratosis, plantar left first metatarsal, no webspace macerations, no ecchymosis bilateral, all nails x 10 are well manicured.  Vascular: Dorsalis Pedis and Posterior Tibial pedal pulses 2/4, Capillary Fill Time 3 seconds, + pedal hair growth bilateral, no edema bilateral lower extremities, Temperature gradient within normal  limits.  Neurology: Johney Maine sensation intact via light touch bilateral. Protective and epicritic sensation intact bilateral.   Musculoskeletal: Mild tenderness with palpation at the lesion site on Left, pes cavus foot type .Muscular strength 5/5 in all groups without pain or limitation on range of motion.  Assessment and Plan: Problem List Items Addressed This Visit    None    Visit Diagnoses    Porokeratosis    -  Primary    sub met 1 left    Left foot pain        Pes cavus, unspecified laterality           -Complete examination performed -Discussed treatment options for painful porokeratosis -Parred keratoic lesion using a chisel blade; treated the area with Salinocaine covered with Moleskin; Advised patient to keep intact for 1 day -Recommend good supportive shoes daily for foot type -Recommend daily skin emollients -Recommend use of pumice stone as needed -Patient to return to office as needed or sooner if condition worsens.  Landis Martins, DPM

## 2015-11-29 DIAGNOSIS — M545 Low back pain: Secondary | ICD-10-CM | POA: Diagnosis not present

## 2015-11-29 DIAGNOSIS — M79606 Pain in leg, unspecified: Secondary | ICD-10-CM | POA: Diagnosis not present

## 2015-11-29 DIAGNOSIS — M961 Postlaminectomy syndrome, not elsewhere classified: Secondary | ICD-10-CM | POA: Diagnosis not present

## 2015-11-29 DIAGNOSIS — G894 Chronic pain syndrome: Secondary | ICD-10-CM | POA: Diagnosis not present

## 2015-12-20 DIAGNOSIS — M545 Low back pain: Secondary | ICD-10-CM | POA: Diagnosis not present

## 2015-12-20 DIAGNOSIS — M961 Postlaminectomy syndrome, not elsewhere classified: Secondary | ICD-10-CM | POA: Diagnosis not present

## 2015-12-20 DIAGNOSIS — G894 Chronic pain syndrome: Secondary | ICD-10-CM | POA: Diagnosis not present

## 2015-12-20 DIAGNOSIS — Z4542 Encounter for adjustment and management of neuropacemaker (brain) (peripheral nerve) (spinal cord): Secondary | ICD-10-CM | POA: Diagnosis not present

## 2015-12-20 DIAGNOSIS — M79606 Pain in leg, unspecified: Secondary | ICD-10-CM | POA: Diagnosis not present

## 2016-01-14 DIAGNOSIS — M1711 Unilateral primary osteoarthritis, right knee: Secondary | ICD-10-CM | POA: Diagnosis not present

## 2016-01-17 DIAGNOSIS — G894 Chronic pain syndrome: Secondary | ICD-10-CM | POA: Diagnosis not present

## 2016-01-17 DIAGNOSIS — M545 Low back pain: Secondary | ICD-10-CM | POA: Diagnosis not present

## 2016-01-17 DIAGNOSIS — Z79891 Long term (current) use of opiate analgesic: Secondary | ICD-10-CM | POA: Diagnosis not present

## 2016-01-17 DIAGNOSIS — M79606 Pain in leg, unspecified: Secondary | ICD-10-CM | POA: Diagnosis not present

## 2016-01-17 DIAGNOSIS — Z79899 Other long term (current) drug therapy: Secondary | ICD-10-CM | POA: Diagnosis not present

## 2016-01-17 DIAGNOSIS — Z4542 Encounter for adjustment and management of neuropacemaker (brain) (peripheral nerve) (spinal cord): Secondary | ICD-10-CM | POA: Diagnosis not present

## 2016-01-17 DIAGNOSIS — M961 Postlaminectomy syndrome, not elsewhere classified: Secondary | ICD-10-CM | POA: Diagnosis not present

## 2016-02-14 DIAGNOSIS — Z79899 Other long term (current) drug therapy: Secondary | ICD-10-CM | POA: Diagnosis not present

## 2016-02-14 DIAGNOSIS — Z79891 Long term (current) use of opiate analgesic: Secondary | ICD-10-CM | POA: Diagnosis not present

## 2016-02-14 DIAGNOSIS — G894 Chronic pain syndrome: Secondary | ICD-10-CM | POA: Diagnosis not present

## 2016-02-14 DIAGNOSIS — Z4542 Encounter for adjustment and management of neuropacemaker (brain) (peripheral nerve) (spinal cord): Secondary | ICD-10-CM | POA: Diagnosis not present

## 2016-02-14 DIAGNOSIS — M961 Postlaminectomy syndrome, not elsewhere classified: Secondary | ICD-10-CM | POA: Diagnosis not present

## 2016-02-18 DIAGNOSIS — H40013 Open angle with borderline findings, low risk, bilateral: Secondary | ICD-10-CM | POA: Diagnosis not present

## 2016-03-13 DIAGNOSIS — Z79899 Other long term (current) drug therapy: Secondary | ICD-10-CM | POA: Diagnosis not present

## 2016-03-13 DIAGNOSIS — Z4542 Encounter for adjustment and management of neuropacemaker (brain) (peripheral nerve) (spinal cord): Secondary | ICD-10-CM | POA: Diagnosis not present

## 2016-03-13 DIAGNOSIS — M79606 Pain in leg, unspecified: Secondary | ICD-10-CM | POA: Diagnosis not present

## 2016-03-13 DIAGNOSIS — Z79891 Long term (current) use of opiate analgesic: Secondary | ICD-10-CM | POA: Diagnosis not present

## 2016-03-13 DIAGNOSIS — G894 Chronic pain syndrome: Secondary | ICD-10-CM | POA: Diagnosis not present

## 2016-03-13 DIAGNOSIS — M961 Postlaminectomy syndrome, not elsewhere classified: Secondary | ICD-10-CM | POA: Diagnosis not present

## 2016-03-13 DIAGNOSIS — M545 Low back pain: Secondary | ICD-10-CM | POA: Diagnosis not present

## 2016-04-10 DIAGNOSIS — M961 Postlaminectomy syndrome, not elsewhere classified: Secondary | ICD-10-CM | POA: Diagnosis not present

## 2016-04-10 DIAGNOSIS — Z79891 Long term (current) use of opiate analgesic: Secondary | ICD-10-CM | POA: Diagnosis not present

## 2016-04-10 DIAGNOSIS — G894 Chronic pain syndrome: Secondary | ICD-10-CM | POA: Diagnosis not present

## 2016-04-10 DIAGNOSIS — Z4542 Encounter for adjustment and management of neuropacemaker (brain) (peripheral nerve) (spinal cord): Secondary | ICD-10-CM | POA: Diagnosis not present

## 2016-04-10 DIAGNOSIS — M79606 Pain in leg, unspecified: Secondary | ICD-10-CM | POA: Diagnosis not present

## 2016-04-10 DIAGNOSIS — M545 Low back pain: Secondary | ICD-10-CM | POA: Diagnosis not present

## 2016-04-10 DIAGNOSIS — Z79899 Other long term (current) drug therapy: Secondary | ICD-10-CM | POA: Diagnosis not present

## 2016-04-24 DIAGNOSIS — Z79899 Other long term (current) drug therapy: Secondary | ICD-10-CM | POA: Diagnosis not present

## 2016-05-12 DIAGNOSIS — Z79899 Other long term (current) drug therapy: Secondary | ICD-10-CM | POA: Diagnosis not present

## 2016-05-12 DIAGNOSIS — M545 Low back pain: Secondary | ICD-10-CM | POA: Diagnosis not present

## 2016-05-12 DIAGNOSIS — M961 Postlaminectomy syndrome, not elsewhere classified: Secondary | ICD-10-CM | POA: Diagnosis not present

## 2016-05-12 DIAGNOSIS — G894 Chronic pain syndrome: Secondary | ICD-10-CM | POA: Diagnosis not present

## 2016-05-12 DIAGNOSIS — Z79891 Long term (current) use of opiate analgesic: Secondary | ICD-10-CM | POA: Diagnosis not present

## 2016-05-12 DIAGNOSIS — M79606 Pain in leg, unspecified: Secondary | ICD-10-CM | POA: Diagnosis not present

## 2016-05-27 DIAGNOSIS — M5412 Radiculopathy, cervical region: Secondary | ICD-10-CM | POA: Diagnosis not present

## 2016-05-27 DIAGNOSIS — R200 Anesthesia of skin: Secondary | ICD-10-CM | POA: Diagnosis not present

## 2016-05-30 DIAGNOSIS — Z79899 Other long term (current) drug therapy: Secondary | ICD-10-CM | POA: Diagnosis not present

## 2016-05-30 DIAGNOSIS — E785 Hyperlipidemia, unspecified: Secondary | ICD-10-CM | POA: Diagnosis not present

## 2016-06-09 DIAGNOSIS — Z4542 Encounter for adjustment and management of neuropacemaker (brain) (peripheral nerve) (spinal cord): Secondary | ICD-10-CM | POA: Diagnosis not present

## 2016-06-09 DIAGNOSIS — G894 Chronic pain syndrome: Secondary | ICD-10-CM | POA: Diagnosis not present

## 2016-06-09 DIAGNOSIS — Z79891 Long term (current) use of opiate analgesic: Secondary | ICD-10-CM | POA: Diagnosis not present

## 2016-06-09 DIAGNOSIS — M545 Low back pain: Secondary | ICD-10-CM | POA: Diagnosis not present

## 2016-06-09 DIAGNOSIS — Z79899 Other long term (current) drug therapy: Secondary | ICD-10-CM | POA: Diagnosis not present

## 2016-06-09 DIAGNOSIS — M961 Postlaminectomy syndrome, not elsewhere classified: Secondary | ICD-10-CM | POA: Diagnosis not present

## 2016-06-11 DIAGNOSIS — M899 Disorder of bone, unspecified: Secondary | ICD-10-CM | POA: Diagnosis not present

## 2016-07-08 DIAGNOSIS — M961 Postlaminectomy syndrome, not elsewhere classified: Secondary | ICD-10-CM | POA: Diagnosis not present

## 2016-07-08 DIAGNOSIS — M545 Low back pain: Secondary | ICD-10-CM | POA: Diagnosis not present

## 2016-07-08 DIAGNOSIS — Z79899 Other long term (current) drug therapy: Secondary | ICD-10-CM | POA: Diagnosis not present

## 2016-07-08 DIAGNOSIS — G894 Chronic pain syndrome: Secondary | ICD-10-CM | POA: Diagnosis not present

## 2016-07-08 DIAGNOSIS — M79606 Pain in leg, unspecified: Secondary | ICD-10-CM | POA: Diagnosis not present

## 2016-07-08 DIAGNOSIS — Z4542 Encounter for adjustment and management of neuropacemaker (brain) (peripheral nerve) (spinal cord): Secondary | ICD-10-CM | POA: Diagnosis not present

## 2016-07-08 DIAGNOSIS — Z79891 Long term (current) use of opiate analgesic: Secondary | ICD-10-CM | POA: Diagnosis not present

## 2016-08-01 ENCOUNTER — Encounter: Payer: Self-pay | Admitting: Sports Medicine

## 2016-08-01 ENCOUNTER — Ambulatory Visit (INDEPENDENT_AMBULATORY_CARE_PROVIDER_SITE_OTHER): Payer: BLUE CROSS/BLUE SHIELD | Admitting: Sports Medicine

## 2016-08-01 ENCOUNTER — Ambulatory Visit (INDEPENDENT_AMBULATORY_CARE_PROVIDER_SITE_OTHER): Payer: BLUE CROSS/BLUE SHIELD

## 2016-08-01 DIAGNOSIS — M779 Enthesopathy, unspecified: Secondary | ICD-10-CM | POA: Diagnosis not present

## 2016-08-01 DIAGNOSIS — M19071 Primary osteoarthritis, right ankle and foot: Secondary | ICD-10-CM

## 2016-08-01 DIAGNOSIS — Q828 Other specified congenital malformations of skin: Secondary | ICD-10-CM | POA: Diagnosis not present

## 2016-08-01 DIAGNOSIS — M79671 Pain in right foot: Secondary | ICD-10-CM | POA: Diagnosis not present

## 2016-08-01 DIAGNOSIS — M722 Plantar fascial fibromatosis: Secondary | ICD-10-CM | POA: Diagnosis not present

## 2016-08-01 MED ORDER — TRIAMCINOLONE ACETONIDE 10 MG/ML IJ SUSP: 10.0000 mg | Freq: Once | INTRAMUSCULAR | Status: DC

## 2016-08-01 NOTE — Patient Instructions (Signed)

## 2016-08-01 NOTE — Progress Notes (Signed)
Patient ID: Sonya Allen, female   DOB: 1955-12-21, 59 y.o.   MRN: [id number] Subjective: Sonya Allen is a 60 y.o. female patient who presents to office for evaluation of Right foot pain. Patient complains of pain at the lesion present Right foot at the ball. Patient states that she has tried a callus scraper, and experienced bleeding over the weekend. Patient also states that her heel and arch hurts as well. Denies any injury or trauma. Admits to old history of ankle injury with fusion and foot surgery.. Patient denies any other pedal complaints.   Patient Active Problem List   Diagnosis Date Noted  . 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 betamethasone acetate-betamethasone sodium phosphate (CELESTONE) 6 (3-3) MG/ML injection Inject 12 mg into the articular space.    . bupivacaine, PF, (MARCAINE) 0.25 % SOLN injection Inject 25 mg into the skin.    . carisoprodol (SOMA) 350 MG tablet Take 350 mg by mouth 3 (three) times daily.    . carisoprodol (SOMA) 350 MG tablet     . Cholecalciferol (VITAMIN D-3 PO) Take 1 capsule by mouth daily.    . clonazePAM (KLONOPIN) 0.5 MG tablet Take 0.5 mg by mouth at bedtime. Restless legs    . clonazePAM (KLONOPIN) 0.5 MG tablet     . conjugated estrogens (PREMARIN) vaginal cream     . docusate sodium (COLACE) 100 MG capsule Take 100 mg by mouth daily.    Marland Kitchen enoxaparin (LOVENOX) 40 MG/0.4ML injection Inject 0.4 mLs (40 mg total) into the skin daily. (Patient not taking: Reported on 08/01/2016) 12 Syringe 0  . furosemide (LASIX) 20 MG tablet Take 20 mg by mouth daily.    . furosemide (LASIX) 20 MG tablet     . GRALISE 600 MG TABS     . HYDROmorphone HCl (EXALGO) 12 MG T24A SR tablet     . Hylan 48 MG/6ML SOSY Inject 48 mg into the articular space.    Marland Kitchen ibuprofen (ADVIL,MOTRIN) 800 MG tablet     . methocarbamol (ROBAXIN) 500 MG tablet Take 1-2 tablets (500-1,000 mg  total) by mouth every 6 (six) hours as needed for muscle spasms. (Patient not taking: Reported on 08/01/2016) 60 tablet 2  . morphine (MS CONTIN) 30 MG 12 hr tablet Take by mouth.    . morphine (MS CONTIN) 60 MG 12 hr tablet Take 60 mg by mouth every 12 (twelve) hours.    Marland Kitchen MOVANTIK 12.5 MG TABS     . NUVIGIL 150 MG tablet     . ondansetron (ZOFRAN) 4 MG tablet     . ondansetron (ZOFRAN) 4 MG tablet     . oxycodone (ROXICODONE) 30 MG immediate release tablet Take 1 tablet (30 mg total) by mouth every 8 (eight) hours as needed (for breakthrough pain). (Patient not taking: Reported on 08/01/2016) 100 tablet 0  . Polyethyl Glycol-Propyl Glycol (SYSTANE OP) Apply 1 drop to eye 2 (two) times daily as needed (dry eyes).    . pravastatin (PRAVACHOL) 40 MG tablet Take 40 mg by mouth daily.    . pravastatin (PRAVACHOL) 40 MG tablet Take by mouth.    . PredniSONE 10 MG KIT Take as instructed for 12 days and a tapering dose 48 each 0  . pregabalin (LYRICA) 150 MG capsule Take 150 mg by mouth daily.    Marland Kitchen tiZANidine (ZANAFLEX) 4 MG tablet     .  venlafaxine XR (EFFEXOR-XR) 150 MG 24 hr capsule Take 150 mg by mouth daily.    Marland Kitchen venlafaxine XR (EFFEXOR-XR) 150 MG 24 hr capsule     . Vitamin D, Ergocalciferol, (DRISDOL) 50000 UNITS CAPS capsule     . ZETIA 10 MG tablet      No current facility-administered medications on file prior to visit.     Allergies  Allergen Reactions  . Augmentin [Amoxicillin-Pot Clavulanate] Diarrhea    Objective:  General: Alert and oriented x3 in no acute distress  Dermatology: Keratotic lesion present measuring <0.5 cm with skin lines transversing the lesion, minimal pain is present with direct pressure to the lesion, central nucleated core noted suggestive of porokeratosis, plantar right forefoot x 2 with no infection, no webspace macerations, no ecchymosis bilateral, all nails x 10 are well manicured.  Vascular: Dorsalis Pedis 1/4 and Posterior Tibial pedal pulses 2/4,  Capillary Fill Time 3 seconds, + scant pedal hair growth bilateral, no edema bilateral lower extremities, Temperature gradient within normal limits.  Neurology: Johney Maine sensation intact via light touch bilateral. Protective and epicritic sensation intact bilateral.   Musculoskeletal: Mild tenderness with palpation at medial plantar fascia insertion and arch, minimal pain to palpation at the callus sites on right, pes cavus foot type with fat pad atrophy .Muscular strength 5/5 in all groups without pain but limitation on range of motion form ankle fusion and athritis.  Xray right foot- Hardware intact, significant inferior calcaneal spur, and diffuse arthritis, no fracture, soft tissues within normal limits.   Assessment and Plan: Problem List Items Addressed This Visit    None    Visit Diagnoses    Capsulitis    -  Primary   Relevant Medications   triamcinolone acetonide (KENALOG) 10 MG/ML injection 10 mg   Other Relevant Orders   DG Foot Complete Right   Plantar fasciitis       Relevant Medications   triamcinolone acetonide (KENALOG) 10 MG/ML injection 10 mg   Right foot pain       Relevant Medications   triamcinolone acetonide (KENALOG) 10 MG/ML injection 10 mg   Porokeratosis       Arthritis of right foot          -Complete examination performed -xrays reviewed -After oral consent and aseptic prep, injected a mixture containing 1 ml of 2% plain lidocaine, 1 ml 0.5% plain marcaine, 0.5 ml of kenalog 10 and 0.5 ml of dexamethasone phosphate into right heel without complication. Post-injection care discussed with patient.  -Recommend stretching and icing and OTC anti-inflammatories as instructed -Discussed treatment options for painful porokeratosis -Advised patient to refrain from aggressive trimming of callus to prevent ulceration -Recommend good supportive shoes daily for foot type and replace old inserts -Recommend daily skin emollients -Recommend use of pumice stone as  needed -Patient to return to office as needed or sooner if condition worsens.  Sonya Allen, DPM

## 2016-08-04 DIAGNOSIS — M79606 Pain in leg, unspecified: Secondary | ICD-10-CM | POA: Diagnosis not present

## 2016-08-04 DIAGNOSIS — G894 Chronic pain syndrome: Secondary | ICD-10-CM | POA: Diagnosis not present

## 2016-08-04 DIAGNOSIS — M545 Low back pain: Secondary | ICD-10-CM | POA: Diagnosis not present

## 2016-08-04 DIAGNOSIS — Z79899 Other long term (current) drug therapy: Secondary | ICD-10-CM | POA: Diagnosis not present

## 2016-08-04 DIAGNOSIS — Z79891 Long term (current) use of opiate analgesic: Secondary | ICD-10-CM | POA: Diagnosis not present

## 2016-08-04 DIAGNOSIS — M961 Postlaminectomy syndrome, not elsewhere classified: Secondary | ICD-10-CM | POA: Diagnosis not present

## 2016-09-08 DIAGNOSIS — M79606 Pain in leg, unspecified: Secondary | ICD-10-CM | POA: Diagnosis not present

## 2016-09-08 DIAGNOSIS — Z79899 Other long term (current) drug therapy: Secondary | ICD-10-CM | POA: Diagnosis not present

## 2016-09-08 DIAGNOSIS — Z4542 Encounter for adjustment and management of neuropacemaker (brain) (peripheral nerve) (spinal cord): Secondary | ICD-10-CM | POA: Diagnosis not present

## 2016-09-08 DIAGNOSIS — Z79891 Long term (current) use of opiate analgesic: Secondary | ICD-10-CM | POA: Diagnosis not present

## 2016-09-08 DIAGNOSIS — G894 Chronic pain syndrome: Secondary | ICD-10-CM | POA: Diagnosis not present

## 2016-09-08 DIAGNOSIS — M545 Low back pain: Secondary | ICD-10-CM | POA: Diagnosis not present

## 2016-09-08 DIAGNOSIS — M961 Postlaminectomy syndrome, not elsewhere classified: Secondary | ICD-10-CM | POA: Diagnosis not present

## 2016-09-22 DIAGNOSIS — M1711 Unilateral primary osteoarthritis, right knee: Secondary | ICD-10-CM | POA: Diagnosis not present

## 2016-10-08 DIAGNOSIS — M542 Cervicalgia: Secondary | ICD-10-CM | POA: Diagnosis not present

## 2016-10-08 DIAGNOSIS — M961 Postlaminectomy syndrome, not elsewhere classified: Secondary | ICD-10-CM | POA: Diagnosis not present

## 2016-10-08 DIAGNOSIS — Z79899 Other long term (current) drug therapy: Secondary | ICD-10-CM | POA: Diagnosis not present

## 2016-10-08 DIAGNOSIS — M545 Low back pain: Secondary | ICD-10-CM | POA: Diagnosis not present

## 2016-10-08 DIAGNOSIS — Z79891 Long term (current) use of opiate analgesic: Secondary | ICD-10-CM | POA: Diagnosis not present

## 2016-10-08 DIAGNOSIS — G894 Chronic pain syndrome: Secondary | ICD-10-CM | POA: Diagnosis not present

## 2016-10-20 ENCOUNTER — Ambulatory Visit (INDEPENDENT_AMBULATORY_CARE_PROVIDER_SITE_OTHER): Payer: BLUE CROSS/BLUE SHIELD | Admitting: Orthopedic Surgery

## 2016-10-23 ENCOUNTER — Other Ambulatory Visit: Payer: Self-pay | Admitting: Family Medicine

## 2016-10-23 DIAGNOSIS — M542 Cervicalgia: Secondary | ICD-10-CM | POA: Diagnosis not present

## 2016-10-23 DIAGNOSIS — M79609 Pain in unspecified limb: Secondary | ICD-10-CM | POA: Diagnosis not present

## 2016-11-01 ENCOUNTER — Ambulatory Visit
Admission: RE | Admit: 2016-11-01 | Discharge: 2016-11-01 | Disposition: A | Discharge status: Home / Self Care | Payer: BLUE CROSS/BLUE SHIELD | Source: Ambulatory Visit | Attending: Family Medicine | Admitting: Family Medicine

## 2016-11-01 DIAGNOSIS — M50221 Other cervical disc displacement at C4-C5 level: Secondary | ICD-10-CM | POA: Diagnosis not present

## 2016-11-01 DIAGNOSIS — M542 Cervicalgia: Secondary | ICD-10-CM

## 2016-11-05 DIAGNOSIS — Z79891 Long term (current) use of opiate analgesic: Secondary | ICD-10-CM | POA: Diagnosis not present

## 2016-11-05 DIAGNOSIS — G894 Chronic pain syndrome: Secondary | ICD-10-CM | POA: Diagnosis not present

## 2016-11-05 DIAGNOSIS — M961 Postlaminectomy syndrome, not elsewhere classified: Secondary | ICD-10-CM | POA: Diagnosis not present

## 2016-11-05 DIAGNOSIS — M545 Low back pain: Secondary | ICD-10-CM | POA: Diagnosis not present

## 2016-11-05 DIAGNOSIS — Z79899 Other long term (current) drug therapy: Secondary | ICD-10-CM | POA: Diagnosis not present

## 2016-11-05 DIAGNOSIS — M542 Cervicalgia: Secondary | ICD-10-CM | POA: Diagnosis not present

## 2016-11-18 DIAGNOSIS — Z1231 Encounter for screening mammogram for malignant neoplasm of breast: Secondary | ICD-10-CM | POA: Diagnosis not present

## 2016-11-20 ENCOUNTER — Other Ambulatory Visit: Payer: Self-pay | Admitting: Anesthesiology

## 2016-11-20 ENCOUNTER — Ambulatory Visit
Admission: RE | Admit: 2016-11-20 | Discharge: 2016-11-20 | Disposition: A | Discharge status: Home / Self Care | Payer: BLUE CROSS/BLUE SHIELD | Source: Ambulatory Visit | Attending: Anesthesiology | Admitting: Anesthesiology

## 2016-11-20 DIAGNOSIS — M542 Cervicalgia: Secondary | ICD-10-CM

## 2016-11-20 DIAGNOSIS — R202 Paresthesia of skin: Secondary | ICD-10-CM | POA: Diagnosis not present

## 2016-11-20 DIAGNOSIS — M5030 Other cervical disc degeneration, unspecified cervical region: Secondary | ICD-10-CM | POA: Diagnosis not present

## 2016-12-08 DIAGNOSIS — M545 Low back pain: Secondary | ICD-10-CM | POA: Diagnosis not present

## 2016-12-08 DIAGNOSIS — Z79891 Long term (current) use of opiate analgesic: Secondary | ICD-10-CM | POA: Diagnosis not present

## 2016-12-08 DIAGNOSIS — M961 Postlaminectomy syndrome, not elsewhere classified: Secondary | ICD-10-CM | POA: Diagnosis not present

## 2016-12-08 DIAGNOSIS — M542 Cervicalgia: Secondary | ICD-10-CM | POA: Diagnosis not present

## 2016-12-08 DIAGNOSIS — G894 Chronic pain syndrome: Secondary | ICD-10-CM | POA: Diagnosis not present

## 2016-12-08 DIAGNOSIS — Z79899 Other long term (current) drug therapy: Secondary | ICD-10-CM | POA: Diagnosis not present

## 2016-12-31 ENCOUNTER — Other Ambulatory Visit: Payer: Self-pay | Admitting: Physician Assistant

## 2016-12-31 ENCOUNTER — Ambulatory Visit
Admission: RE | Admit: 2016-12-31 | Discharge: 2016-12-31 | Disposition: A | Discharge status: Home / Self Care | Payer: BLUE CROSS/BLUE SHIELD | Source: Ambulatory Visit | Attending: Physician Assistant | Admitting: Physician Assistant

## 2016-12-31 DIAGNOSIS — M545 Low back pain, unspecified: Secondary | ICD-10-CM

## 2016-12-31 DIAGNOSIS — M79605 Pain in left leg: Secondary | ICD-10-CM

## 2016-12-31 DIAGNOSIS — M79604 Pain in right leg: Secondary | ICD-10-CM

## 2016-12-31 DIAGNOSIS — G8929 Other chronic pain: Secondary | ICD-10-CM

## 2016-12-31 DIAGNOSIS — G894 Chronic pain syndrome: Secondary | ICD-10-CM

## 2016-12-31 DIAGNOSIS — M961 Postlaminectomy syndrome, not elsewhere classified: Secondary | ICD-10-CM

## 2017-01-01 DIAGNOSIS — K648 Other hemorrhoids: Secondary | ICD-10-CM | POA: Diagnosis not present

## 2017-01-01 DIAGNOSIS — F329 Major depressive disorder, single episode, unspecified: Secondary | ICD-10-CM | POA: Diagnosis not present

## 2017-01-01 DIAGNOSIS — E559 Vitamin D deficiency, unspecified: Secondary | ICD-10-CM | POA: Diagnosis not present

## 2017-01-01 DIAGNOSIS — Z79899 Other long term (current) drug therapy: Secondary | ICD-10-CM | POA: Diagnosis not present

## 2017-01-01 DIAGNOSIS — E785 Hyperlipidemia, unspecified: Secondary | ICD-10-CM | POA: Diagnosis not present

## 2017-01-05 DIAGNOSIS — Z79891 Long term (current) use of opiate analgesic: Secondary | ICD-10-CM | POA: Diagnosis not present

## 2017-01-05 DIAGNOSIS — Z4542 Encounter for adjustment and management of neuropacemaker (brain) (peripheral nerve) (spinal cord): Secondary | ICD-10-CM | POA: Diagnosis not present

## 2017-01-05 DIAGNOSIS — Z79899 Other long term (current) drug therapy: Secondary | ICD-10-CM | POA: Diagnosis not present

## 2017-01-05 DIAGNOSIS — M5030 Other cervical disc degeneration, unspecified cervical region: Secondary | ICD-10-CM | POA: Diagnosis not present

## 2017-01-05 DIAGNOSIS — G894 Chronic pain syndrome: Secondary | ICD-10-CM | POA: Diagnosis not present

## 2017-01-05 DIAGNOSIS — M961 Postlaminectomy syndrome, not elsewhere classified: Secondary | ICD-10-CM | POA: Diagnosis not present

## 2017-01-05 DIAGNOSIS — M542 Cervicalgia: Secondary | ICD-10-CM | POA: Diagnosis not present

## 2017-01-05 DIAGNOSIS — M545 Low back pain: Secondary | ICD-10-CM | POA: Diagnosis not present

## 2017-01-05 DIAGNOSIS — R202 Paresthesia of skin: Secondary | ICD-10-CM | POA: Diagnosis not present

## 2017-01-09 DIAGNOSIS — H9193 Unspecified hearing loss, bilateral: Secondary | ICD-10-CM | POA: Diagnosis not present

## 2017-01-09 DIAGNOSIS — H9212 Otorrhea, left ear: Secondary | ICD-10-CM | POA: Diagnosis not present

## 2017-01-16 ENCOUNTER — Ambulatory Visit: Payer: BLUE CROSS/BLUE SHIELD | Admitting: Sports Medicine

## 2017-01-23 ENCOUNTER — Ambulatory Visit (INDEPENDENT_AMBULATORY_CARE_PROVIDER_SITE_OTHER): Payer: BLUE CROSS/BLUE SHIELD | Admitting: Sports Medicine

## 2017-01-23 DIAGNOSIS — Q828 Other specified congenital malformations of skin: Secondary | ICD-10-CM

## 2017-01-23 DIAGNOSIS — M79671 Pain in right foot: Secondary | ICD-10-CM

## 2017-01-23 NOTE — Progress Notes (Signed)
Patient ID: Sonya Allen, female   DOB: 06/16/1956, 61 y.o.   MRN: [id number] Subjective: Sonya Allen is a 61 y.o. female patient who returns to office for evaluation of Right>Left foot pain. Patient complains of pain at the lesion present Right>Left foot at the ball. Patient states that the pain at these areas have returned. Patient denies any other pedal complaints.   Patient Active Problem List   Diagnosis Date Noted  . 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 betamethasone acetate-betamethasone sodium phosphate (CELESTONE) 6 (3-3) MG/ML injection Inject 12 mg into the articular space.    . bupivacaine, PF, (MARCAINE) 0.25 % SOLN injection Inject 25 mg into the skin.    . carisoprodol (SOMA) 350 MG tablet     . clonazePAM (KLONOPIN) 0.5 MG tablet     . conjugated estrogens (PREMARIN) vaginal cream     . docusate sodium (COLACE) 100 MG capsule Take 100 mg by mouth daily.    Marland Kitchen enoxaparin (LOVENOX) 40 MG/0.4ML injection Inject 0.4 mLs (40 mg total) into the skin daily. 12 Syringe 0  . furosemide (LASIX) 20 MG tablet     . GRALISE 600 MG TABS     . HYDROmorphone HCl (EXALGO) 12 MG T24A SR tablet     . Hylan 48 MG/6ML SOSY Inject 48 mg into the articular space.    Marland Kitchen ibuprofen (ADVIL,MOTRIN) 800 MG tablet     . methocarbamol (ROBAXIN) 500 MG tablet Take 1-2 tablets (500-1,000 mg total) by mouth every 6 (six) hours as needed for muscle spasms. 60 tablet 2  . morphine (MS CONTIN) 30 MG 12 hr tablet Take by mouth.    . morphine (MS CONTIN) 60 MG 12 hr tablet Take 60 mg by mouth every 12 (twelve) hours.    Marland Kitchen MOVANTIK 12.5 MG TABS     . NUVIGIL 150 MG tablet     . ondansetron (ZOFRAN) 4 MG tablet     . ondansetron (ZOFRAN) 4 MG tablet     . oxycodone (ROXICODONE) 30 MG immediate release tablet Take 1 tablet (30 mg total) by mouth every 8 (eight) hours as needed (for breakthrough pain). 100 tablet 0  .  oxyCODONE-acetaminophen (PERCOCET) 10-325 MG tablet Take 1 tablet by mouth every 4 (four) hours as needed for pain.    Vladimir Faster Glycol-Propyl Glycol (SYSTANE OP) Apply 1 drop to eye 2 (two) times daily as needed (dry eyes).    . pravastatin (PRAVACHOL) 40 MG tablet Take by mouth.    . pregabalin (LYRICA) 150 MG capsule Take 150 mg by mouth daily.    Marland Kitchen tiZANidine (ZANAFLEX) 4 MG tablet     . venlafaxine XR (EFFEXOR-XR) 150 MG 24 hr capsule     . ZETIA 10 MG tablet     . carisoprodol (SOMA) 350 MG tablet Take 350 mg by mouth 3 (three) times daily.    . clonazePAM (KLONOPIN) 0.5 MG tablet Take 0.5 mg by mouth at bedtime. Restless legs    . furosemide (LASIX) 20 MG tablet Take 20 mg by mouth daily.    . pravastatin (PRAVACHOL) 40 MG tablet Take 40 mg by mouth daily.    . PredniSONE 10 MG KIT Take as instructed for 12 days and a tapering dose (Patient not taking: Reported on 01/23/2017) 48 each 0  . venlafaxine XR (EFFEXOR-XR) 150 MG 24 hr capsule Take 150 mg by mouth daily.  Current Facility-Administered Medications on File Prior to Visit  Medication Dose Route Frequency Provider Last Rate Last Dose  . triamcinolone acetonide (KENALOG) 10 MG/ML injection 10 mg  10 mg Other Once Landis Martins, DPM        Allergies  Allergen Reactions  . Augmentin [Amoxicillin-Pot Clavulanate] Diarrhea    Objective:  General: Alert and oriented x3 in no acute distress  Dermatology: Keratotic lesion present measuring <0.5 cm with skin lines transversing the lesion, minimal pain is present with direct pressure to the lesion, central nucleated core noted suggestive of porokeratosis, plantar right sub met 1>left sub met 1 with no infection, no webspace macerations, no ecchymosis bilateral, all nails x 10 are well manicured.  Vascular: Dorsalis Pedis 1/4 and Posterior Tibial pedal pulses 2/4, Capillary Fill Time 3 seconds, + scant pedal hair growth bilateral, no edema bilateral lower extremities, Temperature  gradient within normal limits.  Neurology: Johney Maine sensation intact via light touch bilateral. Protective and epicritic sensation intact bilateral.   Musculoskeletal: Mild pain to palpation at the callus site on right>left, pes cavus/splay foot type with fat pad atrophy.Muscular strength 5/5 in all groups without pain but limitation on range of motion from ankle fusion and arthritis on right. Grade 4 bunion on left.    Assessment and Plan: Problem List Items Addressed This Visit    None    Visit Diagnoses    Porokeratosis    -  Primary   Right foot pain          -Complete examination performed -Discussed treatment options for painful porokeratosis -Mechanically derided callus x 2 using sterile chisel blade and applied saliocaine and offloading padding -Patient desires custom molded orthotics to assist with offloading these areas; Office to check orthotic coverage and will call patient -Advised patient to refrain from aggressive trimming of callus to prevent ulceration -Recommend good supportive shoes daily for foot type  -Recommend daily skin emollients -Recommend use of pumice stone as needed -Patient to return to office for orthotic casting when ready or sooner if condition worsens.  Landis Martins, DPM

## 2017-02-02 DIAGNOSIS — Z79899 Other long term (current) drug therapy: Secondary | ICD-10-CM | POA: Diagnosis not present

## 2017-02-02 DIAGNOSIS — M961 Postlaminectomy syndrome, not elsewhere classified: Secondary | ICD-10-CM | POA: Diagnosis not present

## 2017-02-02 DIAGNOSIS — M545 Low back pain: Secondary | ICD-10-CM | POA: Diagnosis not present

## 2017-02-02 DIAGNOSIS — M542 Cervicalgia: Secondary | ICD-10-CM | POA: Diagnosis not present

## 2017-02-02 DIAGNOSIS — G894 Chronic pain syndrome: Secondary | ICD-10-CM | POA: Diagnosis not present

## 2017-02-02 DIAGNOSIS — Z79891 Long term (current) use of opiate analgesic: Secondary | ICD-10-CM | POA: Diagnosis not present

## 2017-02-04 ENCOUNTER — Ambulatory Visit (INDEPENDENT_AMBULATORY_CARE_PROVIDER_SITE_OTHER): Payer: BLUE CROSS/BLUE SHIELD | Admitting: Sports Medicine

## 2017-02-04 DIAGNOSIS — M779 Enthesopathy, unspecified: Secondary | ICD-10-CM

## 2017-02-04 DIAGNOSIS — M216X9 Other acquired deformities of unspecified foot: Secondary | ICD-10-CM

## 2017-02-04 DIAGNOSIS — M79671 Pain in right foot: Secondary | ICD-10-CM

## 2017-02-04 DIAGNOSIS — M79672 Pain in left foot: Secondary | ICD-10-CM

## 2017-02-04 DIAGNOSIS — Q828 Other specified congenital malformations of skin: Secondary | ICD-10-CM

## 2017-02-04 NOTE — Progress Notes (Signed)
[facility] today for custom functional [facility] Patient to return for pickup when called or when scheduled. -Dr. Marylene LandStover

## 2017-03-02 DIAGNOSIS — M1711 Unilateral primary osteoarthritis, right knee: Secondary | ICD-10-CM | POA: Diagnosis not present

## 2017-03-09 DIAGNOSIS — N819 Female genital prolapse, unspecified: Secondary | ICD-10-CM | POA: Diagnosis not present

## 2017-03-09 DIAGNOSIS — N8110 Cystocele, unspecified: Secondary | ICD-10-CM | POA: Diagnosis not present

## 2017-03-10 DIAGNOSIS — Z79899 Other long term (current) drug therapy: Secondary | ICD-10-CM | POA: Diagnosis not present

## 2017-03-10 DIAGNOSIS — M542 Cervicalgia: Secondary | ICD-10-CM | POA: Diagnosis not present

## 2017-03-10 DIAGNOSIS — Z79891 Long term (current) use of opiate analgesic: Secondary | ICD-10-CM | POA: Diagnosis not present

## 2017-03-10 DIAGNOSIS — M545 Low back pain: Secondary | ICD-10-CM | POA: Diagnosis not present

## 2017-03-10 DIAGNOSIS — G894 Chronic pain syndrome: Secondary | ICD-10-CM | POA: Diagnosis not present

## 2017-03-10 DIAGNOSIS — M961 Postlaminectomy syndrome, not elsewhere classified: Secondary | ICD-10-CM | POA: Diagnosis not present

## 2017-03-13 ENCOUNTER — Ambulatory Visit: Payer: BLUE CROSS/BLUE SHIELD | Admitting: Sports Medicine

## 2017-03-13 DIAGNOSIS — M722 Plantar fascial fibromatosis: Secondary | ICD-10-CM

## 2017-03-13 DIAGNOSIS — M779 Enthesopathy, unspecified: Secondary | ICD-10-CM

## 2017-03-13 DIAGNOSIS — Q828 Other specified congenital malformations of skin: Secondary | ICD-10-CM

## 2017-03-13 DIAGNOSIS — M19071 Primary osteoarthritis, right ankle and foot: Secondary | ICD-10-CM

## 2017-03-13 DIAGNOSIS — M216X9 Other acquired deformities of unspecified foot: Secondary | ICD-10-CM

## 2017-03-13 NOTE — Progress Notes (Signed)
Patient discussed with medical assistant. One pair of Custom Functional orthotics were fitted and dispensed to patient with wear instructions and break in period explained. Patient to follow up as scheduled for continued care or sooner if problems or issues arise. -Dr. Darrah Dredge  

## 2017-03-13 NOTE — Patient Instructions (Signed)

## 2017-03-18 DIAGNOSIS — M542 Cervicalgia: Secondary | ICD-10-CM | POA: Diagnosis not present

## 2017-03-18 DIAGNOSIS — R202 Paresthesia of skin: Secondary | ICD-10-CM | POA: Diagnosis not present

## 2017-03-18 DIAGNOSIS — M5030 Other cervical disc degeneration, unspecified cervical region: Secondary | ICD-10-CM | POA: Diagnosis not present

## 2017-04-02 DIAGNOSIS — H353131 Nonexudative age-related macular degeneration, bilateral, early dry stage: Secondary | ICD-10-CM | POA: Diagnosis not present

## 2017-04-06 DIAGNOSIS — M545 Low back pain: Secondary | ICD-10-CM | POA: Diagnosis not present

## 2017-04-06 DIAGNOSIS — G894 Chronic pain syndrome: Secondary | ICD-10-CM | POA: Diagnosis not present

## 2017-04-06 DIAGNOSIS — N8110 Cystocele, unspecified: Secondary | ICD-10-CM | POA: Diagnosis not present

## 2017-04-06 DIAGNOSIS — M542 Cervicalgia: Secondary | ICD-10-CM | POA: Diagnosis not present

## 2017-04-06 DIAGNOSIS — Z79891 Long term (current) use of opiate analgesic: Secondary | ICD-10-CM | POA: Diagnosis not present

## 2017-04-06 DIAGNOSIS — N3946 Mixed incontinence: Secondary | ICD-10-CM | POA: Diagnosis not present

## 2017-04-06 DIAGNOSIS — N949 Unspecified condition associated with female genital organs and menstrual cycle: Secondary | ICD-10-CM | POA: Diagnosis not present

## 2017-04-06 DIAGNOSIS — R195 Other fecal abnormalities: Secondary | ICD-10-CM | POA: Diagnosis not present

## 2017-04-06 DIAGNOSIS — Z79899 Other long term (current) drug therapy: Secondary | ICD-10-CM | POA: Diagnosis not present

## 2017-04-06 DIAGNOSIS — M961 Postlaminectomy syndrome, not elsewhere classified: Secondary | ICD-10-CM | POA: Diagnosis not present

## 2017-04-15 DIAGNOSIS — E878 Other disorders of electrolyte and fluid balance, not elsewhere classified: Secondary | ICD-10-CM | POA: Diagnosis not present

## 2017-04-15 DIAGNOSIS — E559 Vitamin D deficiency, unspecified: Secondary | ICD-10-CM | POA: Diagnosis not present

## 2017-05-05 DIAGNOSIS — N819 Female genital prolapse, unspecified: Secondary | ICD-10-CM | POA: Diagnosis not present

## 2017-05-07 DIAGNOSIS — M542 Cervicalgia: Secondary | ICD-10-CM | POA: Diagnosis not present

## 2017-05-07 DIAGNOSIS — G894 Chronic pain syndrome: Secondary | ICD-10-CM | POA: Diagnosis not present

## 2017-05-07 DIAGNOSIS — M79606 Pain in leg, unspecified: Secondary | ICD-10-CM | POA: Diagnosis not present

## 2017-05-07 DIAGNOSIS — M961 Postlaminectomy syndrome, not elsewhere classified: Secondary | ICD-10-CM | POA: Diagnosis not present

## 2017-05-11 DIAGNOSIS — E870 Hyperosmolality and hypernatremia: Secondary | ICD-10-CM | POA: Diagnosis not present

## 2017-05-11 DIAGNOSIS — G2581 Restless legs syndrome: Secondary | ICD-10-CM | POA: Diagnosis not present

## 2017-05-11 DIAGNOSIS — R635 Abnormal weight gain: Secondary | ICD-10-CM | POA: Diagnosis not present

## 2017-05-11 DIAGNOSIS — Z01818 Encounter for other preprocedural examination: Secondary | ICD-10-CM | POA: Diagnosis not present

## 2017-05-12 DIAGNOSIS — Z7982 Long term (current) use of aspirin: Secondary | ICD-10-CM | POA: Diagnosis not present

## 2017-05-12 DIAGNOSIS — N993 Prolapse of vaginal vault after hysterectomy: Secondary | ICD-10-CM | POA: Diagnosis not present

## 2017-05-12 DIAGNOSIS — N952 Postmenopausal atrophic vaginitis: Secondary | ICD-10-CM | POA: Diagnosis not present

## 2017-05-12 DIAGNOSIS — N3946 Mixed incontinence: Secondary | ICD-10-CM | POA: Diagnosis not present

## 2017-05-12 DIAGNOSIS — R197 Diarrhea, unspecified: Secondary | ICD-10-CM | POA: Diagnosis not present

## 2017-05-12 DIAGNOSIS — Z87891 Personal history of nicotine dependence: Secondary | ICD-10-CM | POA: Diagnosis not present

## 2017-05-12 DIAGNOSIS — Z01818 Encounter for other preprocedural examination: Secondary | ICD-10-CM | POA: Diagnosis not present

## 2017-05-19 DIAGNOSIS — N812 Incomplete uterovaginal prolapse: Secondary | ICD-10-CM | POA: Diagnosis not present

## 2017-05-19 DIAGNOSIS — N819 Female genital prolapse, unspecified: Secondary | ICD-10-CM | POA: Insufficient documentation

## 2017-05-19 DIAGNOSIS — F419 Anxiety disorder, unspecified: Secondary | ICD-10-CM | POA: Diagnosis not present

## 2017-05-19 DIAGNOSIS — Z96652 Presence of left artificial knee joint: Secondary | ICD-10-CM | POA: Diagnosis not present

## 2017-05-19 DIAGNOSIS — N3946 Mixed incontinence: Secondary | ICD-10-CM | POA: Diagnosis not present

## 2017-05-19 DIAGNOSIS — Z79899 Other long term (current) drug therapy: Secondary | ICD-10-CM | POA: Diagnosis not present

## 2017-05-19 DIAGNOSIS — M797 Fibromyalgia: Secondary | ICD-10-CM | POA: Diagnosis not present

## 2017-05-19 DIAGNOSIS — Z79890 Hormone replacement therapy (postmenopausal): Secondary | ICD-10-CM | POA: Diagnosis not present

## 2017-05-19 DIAGNOSIS — N993 Prolapse of vaginal vault after hysterectomy: Secondary | ICD-10-CM | POA: Diagnosis not present

## 2017-05-19 DIAGNOSIS — G43909 Migraine, unspecified, not intractable, without status migrainosus: Secondary | ICD-10-CM | POA: Diagnosis not present

## 2017-05-19 DIAGNOSIS — Z87891 Personal history of nicotine dependence: Secondary | ICD-10-CM | POA: Diagnosis not present

## 2017-05-19 DIAGNOSIS — Z791 Long term (current) use of non-steroidal anti-inflammatories (NSAID): Secondary | ICD-10-CM | POA: Diagnosis not present

## 2017-05-19 DIAGNOSIS — Z79891 Long term (current) use of opiate analgesic: Secondary | ICD-10-CM | POA: Diagnosis not present

## 2017-05-19 DIAGNOSIS — Z880 Allergy status to penicillin: Secondary | ICD-10-CM | POA: Diagnosis not present

## 2017-05-19 DIAGNOSIS — M1711 Unilateral primary osteoarthritis, right knee: Secondary | ICD-10-CM | POA: Diagnosis not present

## 2017-05-19 HISTORY — DX: Female genital prolapse, unspecified: N81.9

## 2017-05-20 DIAGNOSIS — Z791 Long term (current) use of non-steroidal anti-inflammatories (NSAID): Secondary | ICD-10-CM | POA: Diagnosis not present

## 2017-05-20 DIAGNOSIS — Z96652 Presence of left artificial knee joint: Secondary | ICD-10-CM | POA: Diagnosis not present

## 2017-05-20 DIAGNOSIS — Z79891 Long term (current) use of opiate analgesic: Secondary | ICD-10-CM | POA: Diagnosis not present

## 2017-05-20 DIAGNOSIS — Z880 Allergy status to penicillin: Secondary | ICD-10-CM | POA: Diagnosis not present

## 2017-05-20 DIAGNOSIS — F419 Anxiety disorder, unspecified: Secondary | ICD-10-CM | POA: Diagnosis not present

## 2017-05-20 DIAGNOSIS — Z87891 Personal history of nicotine dependence: Secondary | ICD-10-CM | POA: Diagnosis not present

## 2017-05-20 DIAGNOSIS — N819 Female genital prolapse, unspecified: Secondary | ICD-10-CM | POA: Diagnosis not present

## 2017-05-20 DIAGNOSIS — G43909 Migraine, unspecified, not intractable, without status migrainosus: Secondary | ICD-10-CM | POA: Diagnosis not present

## 2017-05-20 DIAGNOSIS — N3946 Mixed incontinence: Secondary | ICD-10-CM | POA: Diagnosis not present

## 2017-05-20 DIAGNOSIS — M1711 Unilateral primary osteoarthritis, right knee: Secondary | ICD-10-CM | POA: Diagnosis not present

## 2017-05-20 DIAGNOSIS — Z79890 Hormone replacement therapy (postmenopausal): Secondary | ICD-10-CM | POA: Diagnosis not present

## 2017-05-20 DIAGNOSIS — Z79899 Other long term (current) drug therapy: Secondary | ICD-10-CM | POA: Diagnosis not present

## 2017-05-20 DIAGNOSIS — M797 Fibromyalgia: Secondary | ICD-10-CM | POA: Diagnosis not present

## 2017-06-08 DIAGNOSIS — M961 Postlaminectomy syndrome, not elsewhere classified: Secondary | ICD-10-CM | POA: Diagnosis not present

## 2017-06-08 DIAGNOSIS — M545 Low back pain: Secondary | ICD-10-CM | POA: Diagnosis not present

## 2017-06-08 DIAGNOSIS — Z79891 Long term (current) use of opiate analgesic: Secondary | ICD-10-CM | POA: Diagnosis not present

## 2017-06-08 DIAGNOSIS — Z79899 Other long term (current) drug therapy: Secondary | ICD-10-CM | POA: Diagnosis not present

## 2017-06-08 DIAGNOSIS — G894 Chronic pain syndrome: Secondary | ICD-10-CM | POA: Diagnosis not present

## 2017-06-08 DIAGNOSIS — M542 Cervicalgia: Secondary | ICD-10-CM | POA: Diagnosis not present

## 2017-07-02 DIAGNOSIS — M1711 Unilateral primary osteoarthritis, right knee: Secondary | ICD-10-CM | POA: Diagnosis not present

## 2017-07-06 DIAGNOSIS — M545 Low back pain: Secondary | ICD-10-CM | POA: Diagnosis not present

## 2017-07-06 DIAGNOSIS — Z79891 Long term (current) use of opiate analgesic: Secondary | ICD-10-CM | POA: Diagnosis not present

## 2017-07-06 DIAGNOSIS — M542 Cervicalgia: Secondary | ICD-10-CM | POA: Diagnosis not present

## 2017-07-06 DIAGNOSIS — G894 Chronic pain syndrome: Secondary | ICD-10-CM | POA: Diagnosis not present

## 2017-07-06 DIAGNOSIS — M961 Postlaminectomy syndrome, not elsewhere classified: Secondary | ICD-10-CM | POA: Diagnosis not present

## 2017-07-06 DIAGNOSIS — Z79899 Other long term (current) drug therapy: Secondary | ICD-10-CM | POA: Diagnosis not present

## 2017-08-07 ENCOUNTER — Ambulatory Visit: Payer: BLUE CROSS/BLUE SHIELD | Admitting: Sports Medicine

## 2017-08-13 DIAGNOSIS — G8929 Other chronic pain: Secondary | ICD-10-CM | POA: Diagnosis not present

## 2017-08-13 DIAGNOSIS — M19012 Primary osteoarthritis, left shoulder: Secondary | ICD-10-CM | POA: Diagnosis not present

## 2017-08-13 DIAGNOSIS — M25512 Pain in left shoulder: Secondary | ICD-10-CM | POA: Insufficient documentation

## 2017-08-13 HISTORY — DX: Pain in left shoulder: M25.512

## 2017-08-14 DIAGNOSIS — Z79899 Other long term (current) drug therapy: Secondary | ICD-10-CM | POA: Diagnosis not present

## 2017-08-14 DIAGNOSIS — Z79891 Long term (current) use of opiate analgesic: Secondary | ICD-10-CM | POA: Diagnosis not present

## 2017-08-14 DIAGNOSIS — M961 Postlaminectomy syndrome, not elsewhere classified: Secondary | ICD-10-CM | POA: Diagnosis not present

## 2017-08-14 DIAGNOSIS — M79605 Pain in left leg: Secondary | ICD-10-CM | POA: Diagnosis not present

## 2017-08-14 DIAGNOSIS — G894 Chronic pain syndrome: Secondary | ICD-10-CM | POA: Diagnosis not present

## 2017-08-26 ENCOUNTER — Ambulatory Visit: Payer: BLUE CROSS/BLUE SHIELD | Admitting: Sports Medicine

## 2017-09-04 ENCOUNTER — Encounter: Payer: Self-pay | Admitting: Sports Medicine

## 2017-09-04 ENCOUNTER — Ambulatory Visit: Payer: BLUE CROSS/BLUE SHIELD | Admitting: Sports Medicine

## 2017-09-04 DIAGNOSIS — M216X9 Other acquired deformities of unspecified foot: Secondary | ICD-10-CM

## 2017-09-04 DIAGNOSIS — M79671 Pain in right foot: Secondary | ICD-10-CM

## 2017-09-04 DIAGNOSIS — Q828 Other specified congenital malformations of skin: Secondary | ICD-10-CM

## 2017-09-04 DIAGNOSIS — M779 Enthesopathy, unspecified: Secondary | ICD-10-CM | POA: Diagnosis not present

## 2017-09-04 MED ORDER — TRIAMCINOLONE ACETONIDE 10 MG/ML IJ SUSP: 10.0000 mg | Freq: Once | INTRAMUSCULAR | Status: DC

## 2017-09-04 NOTE — Progress Notes (Signed)
Patient ID: Sonya Allen, female   DOB: October 18, 1955, 62 y.o.   MRN: [id number] Subjective: Sonya Allen is a 62 y.o. female patient who returns to office for evaluation of Right>Left foot pain. Patient complains of pain at the lesion present Right ball states that this time her callus area has been hurting since Oct. Trimmed it last night. Patient states that her orthotics help. Patient denies any other pedal complaints.   Patient Active Problem List   Diagnosis Date Noted  . 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 carisoprodol (SOMA) 350 MG tablet Take 350 mg by mouth 3 (three) times daily.    . clonazePAM (KLONOPIN) 0.5 MG tablet Take 0.5 mg by mouth at bedtime. Restless legs    . conjugated estrogens (PREMARIN) vaginal cream     . furosemide (LASIX) 20 MG tablet Take 20 mg by mouth daily.    Marland Kitchen ibuprofen (ADVIL,MOTRIN) 800 MG tablet     . oxyCODONE-acetaminophen (PERCOCET) 10-325 MG tablet Take 1 tablet by mouth every 4 (four) hours as needed for pain.    Vladimir Faster Glycol-Propyl Glycol (SYSTANE OP) Apply 1 drop to eye 2 (two) times daily as needed (dry eyes).    . pregabalin (LYRICA) 150 MG capsule Take 150 mg by mouth daily.    Marland Kitchen tiZANidine (ZANAFLEX) 4 MG tablet     . topiramate (TOPAMAX) 50 MG tablet     . venlafaxine XR (EFFEXOR-XR) 150 MG 24 hr capsule Take 150 mg by mouth daily.    . Vitamin D, Ergocalciferol, (DRISDOL) 50000 units CAPS capsule     . ZETIA 10 MG tablet     . betamethasone acetate-betamethasone sodium phosphate (CELESTONE) 6 (3-3) MG/ML injection Inject 12 mg into the articular space.    . bupivacaine, PF, (MARCAINE) 0.25 % SOLN injection Inject 25 mg into the skin.    . carisoprodol (SOMA) 350 MG tablet     . clonazePAM (KLONOPIN) 0.5 MG tablet     . docusate sodium (COLACE) 100 MG capsule Take 100 mg by mouth daily.    Marland Kitchen enoxaparin (LOVENOX) 40 MG/0.4ML injection Inject  0.4 mLs (40 mg total) into the skin daily. (Patient not taking: Reported on 09/04/2017) 12 Syringe 0  . furosemide (LASIX) 20 MG tablet     . GRALISE 600 MG TABS     . HYDROmorphone HCl (EXALGO) 12 MG T24A SR tablet     . Hylan 48 MG/6ML SOSY Inject 48 mg into the articular space.    . methocarbamol (ROBAXIN) 500 MG tablet Take 1-2 tablets (500-1,000 mg total) by mouth every 6 (six) hours as needed for muscle spasms. (Patient not taking: Reported on 09/04/2017) 60 tablet 2  . morphine (MS CONTIN) 30 MG 12 hr tablet Take by mouth.    . morphine (MS CONTIN) 60 MG 12 hr tablet Take 60 mg by mouth every 12 (twelve) hours.    Marland Kitchen MOVANTIK 12.5 MG TABS     . NUVIGIL 150 MG tablet     . ondansetron (ZOFRAN) 4 MG tablet     . ondansetron (ZOFRAN) 4 MG tablet     . oxycodone (ROXICODONE) 30 MG immediate release tablet Take 1 tablet (30 mg total) by mouth every 8 (eight) hours as needed (for breakthrough pain). (Patient not taking: Reported on 09/04/2017) 100 tablet 0  . pravastatin (PRAVACHOL) 40 MG tablet Take 40 mg by mouth daily.    Marland Kitchen  pravastatin (PRAVACHOL) 40 MG tablet Take by mouth.    . PredniSONE 10 MG KIT Take as instructed for 12 days and a tapering dose (Patient not taking: Reported on 01/23/2017) 48 each 0  . promethazine (PHENERGAN) 12.5 MG tablet     . venlafaxine XR (EFFEXOR-XR) 150 MG 24 hr capsule      Current Facility-Administered Medications on File Prior to Visit  Medication Dose Route Frequency Provider Last Rate Last Dose  . triamcinolone acetonide (KENALOG) 10 MG/ML injection 10 mg  10 mg Other Once Landis Martins, DPM        Allergies  Allergen Reactions  . Augmentin [Amoxicillin-Pot Clavulanate] Diarrhea    Objective:  General: Alert and oriented x3 in no acute distress  Dermatology: Keratotic lesion present measuring <0.5 cm with skin lines transversing the lesion, minimal pain is present with direct pressure to the lesion, central nucleated core noted suggestive of  porokeratosis, plantar right sub met 1>left sub met 1 with no infection, no webspace macerations, no ecchymosis bilateral, all nails x 10 are well manicured.  Vascular: Dorsalis Pedis 1/4 and Posterior Tibial pedal pulses 2/4, Capillary Fill Time 3 seconds, + scant pedal hair growth bilateral, no edema bilateral lower extremities, Temperature gradient within normal limits.  Neurology: Johney Maine sensation intact via light touch bilateral. Protective and epicritic sensation intact bilateral.   Musculoskeletal: Mild pain to palpation at the callus site on right>left, pes cavus/splay foot type with fat pad atrophy.Muscular strength 5/5 in all groups without pain but limitation on range of motion from ankle fusion and arthritis on right. Grade 4 bunion on left.    Assessment and Plan: Problem List Items Addressed This Visit    None    Visit Diagnoses    Capsulitis    -  Primary   Relevant Medications   triamcinolone acetonide (KENALOG) 10 MG/ML injection 10 mg (Start on 09/04/2017  3:00 PM)   Porokeratosis       Prominent metatarsal head, unspecified laterality       Right foot pain          -Complete examination performed -Discussed treatment options for painful porokeratosis -After oral consent and aseptic prep, injected a mixture containing 1 ml of 2%  plain lidocaine, 1 ml 0.5% plain marcaine, 0.5 ml of kenalog 10 and 0.5 ml of dexamethasone phosphate into sub met 1 on right without complication. Post-injection care discussed with patient.  -Dispensed tube foam to use as instructed -Advised patient to refrain from aggressive trimming of callus to prevent ulceration -Recommend good supportive shoes daily for foot type  -Recommend daily skin emollients -Recommend use of pumice stone as needed -Patient to return to office as needed or sooner if condition worsens.  Landis Martins, DPM

## 2017-09-07 DIAGNOSIS — M545 Low back pain: Secondary | ICD-10-CM | POA: Diagnosis not present

## 2017-09-07 DIAGNOSIS — M961 Postlaminectomy syndrome, not elsewhere classified: Secondary | ICD-10-CM | POA: Diagnosis not present

## 2017-09-07 DIAGNOSIS — M79605 Pain in left leg: Secondary | ICD-10-CM | POA: Diagnosis not present

## 2017-09-07 DIAGNOSIS — G894 Chronic pain syndrome: Secondary | ICD-10-CM | POA: Diagnosis not present

## 2017-09-15 ENCOUNTER — Ambulatory Visit: Payer: BLUE CROSS/BLUE SHIELD | Admitting: Sports Medicine

## 2017-09-16 ENCOUNTER — Encounter: Payer: Self-pay | Admitting: Sports Medicine

## 2017-09-16 ENCOUNTER — Ambulatory Visit: Payer: BLUE CROSS/BLUE SHIELD | Admitting: Sports Medicine

## 2017-09-16 DIAGNOSIS — M7741 Metatarsalgia, right foot: Secondary | ICD-10-CM

## 2017-09-16 DIAGNOSIS — Q828 Other specified congenital malformations of skin: Secondary | ICD-10-CM

## 2017-09-16 DIAGNOSIS — M79671 Pain in right foot: Secondary | ICD-10-CM | POA: Diagnosis not present

## 2017-09-16 DIAGNOSIS — M216X9 Other acquired deformities of unspecified foot: Secondary | ICD-10-CM | POA: Diagnosis not present

## 2017-09-16 DIAGNOSIS — M779 Enthesopathy, unspecified: Secondary | ICD-10-CM

## 2017-09-16 NOTE — Progress Notes (Signed)
Patient ID: Sonya Allen, female   DOB: 10-08-1955, 62 y.o.   MRN: [id number] Subjective: Sonya Allen is a 62 y.o. female patient who returns to office for evaluation of Right>Left foot pain. Patient complains of pain at the just in front of the lesion present Right ball states that it is a sharp pain feeling like a nail sticking to her foot when she goes to walk or stand. Patient denies any other pedal complaints.   Patient Active Problem List   Diagnosis Date Noted  . 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 betamethasone acetate-betamethasone sodium phosphate (CELESTONE) 6 (3-3) MG/ML injection Inject 12 mg into the articular space.    . bupivacaine, PF, (MARCAINE) 0.25 % SOLN injection Inject 25 mg into the skin.    . carisoprodol (SOMA) 350 MG tablet Take 350 mg by mouth 3 (three) times daily.    . carisoprodol (SOMA) 350 MG tablet     . clonazePAM (KLONOPIN) 0.5 MG tablet Take 0.5 mg by mouth at bedtime. Restless legs    . clonazePAM (KLONOPIN) 0.5 MG tablet     . conjugated estrogens (PREMARIN) vaginal cream     . docusate sodium (COLACE) 100 MG capsule Take 100 mg by mouth daily.    Marland Kitchen enoxaparin (LOVENOX) 40 MG/0.4ML injection Inject 0.4 mLs (40 mg total) into the skin daily. (Patient not taking: Reported on 09/04/2017) 12 Syringe 0  . furosemide (LASIX) 20 MG tablet Take 20 mg by mouth daily.    . furosemide (LASIX) 20 MG tablet     . GRALISE 600 MG TABS     . HYDROmorphone HCl (EXALGO) 12 MG T24A SR tablet     . Hylan 48 MG/6ML SOSY Inject 48 mg into the articular space.    Marland Kitchen ibuprofen (ADVIL,MOTRIN) 800 MG tablet     . methocarbamol (ROBAXIN) 500 MG tablet Take 1-2 tablets (500-1,000 mg total) by mouth every 6 (six) hours as needed for muscle spasms. (Patient not taking: Reported on 09/04/2017) 60 tablet 2  . morphine (MS CONTIN) 30 MG 12 hr tablet Take by mouth.    . morphine (MS CONTIN)  60 MG 12 hr tablet Take 60 mg by mouth every 12 (twelve) hours.    Marland Kitchen MOVANTIK 12.5 MG TABS     . NUVIGIL 150 MG tablet     . ondansetron (ZOFRAN) 4 MG tablet     . ondansetron (ZOFRAN) 4 MG tablet     . oxycodone (ROXICODONE) 30 MG immediate release tablet Take 1 tablet (30 mg total) by mouth every 8 (eight) hours as needed (for breakthrough pain). (Patient not taking: Reported on 09/04/2017) 100 tablet 0  . oxyCODONE-acetaminophen (PERCOCET) 10-325 MG tablet Take 1 tablet by mouth every 4 (four) hours as needed for pain.    Vladimir Faster Glycol-Propyl Glycol (SYSTANE OP) Apply 1 drop to eye 2 (two) times daily as needed (dry eyes).    . pravastatin (PRAVACHOL) 40 MG tablet Take 40 mg by mouth daily.    . pravastatin (PRAVACHOL) 40 MG tablet Take by mouth.    . PredniSONE 10 MG KIT Take as instructed for 12 days and a tapering dose (Patient not taking: Reported on 01/23/2017) 48 each 0  . pregabalin (LYRICA) 150 MG capsule Take 150 mg by mouth daily.    . promethazine (PHENERGAN) 12.5 MG tablet     . tiZANidine (ZANAFLEX) 4 MG  tablet     . topiramate (TOPAMAX) 50 MG tablet     . venlafaxine XR (EFFEXOR-XR) 150 MG 24 hr capsule Take 150 mg by mouth daily.    Marland Kitchen venlafaxine XR (EFFEXOR-XR) 150 MG 24 hr capsule     . Vitamin D, Ergocalciferol, (DRISDOL) 50000 units CAPS capsule     . ZETIA 10 MG tablet      Current Facility-Administered Medications on File Prior to Visit  Medication Dose Route Frequency Provider Last Rate Last Dose  . triamcinolone acetonide (KENALOG) 10 MG/ML injection 10 mg  10 mg Other Once Landis Martins, DPM      . triamcinolone acetonide (KENALOG) 10 MG/ML injection 10 mg  10 mg Other Once Landis Martins, DPM        Allergies  Allergen Reactions  . Augmentin [Amoxicillin-Pot Clavulanate] Diarrhea    Objective:  General: Alert and oriented x3 in no acute distress  Dermatology: Minimal keratotic lesion present measuring <0.5 cm with skin lines transversing the lesion,  minimal pain is present with direct pressure to the lesion, central nucleated core noted suggestive of porokeratosis, plantar right sub met 1 and 2>left sub met 1 with no infection, no webspace macerations, no ecchymosis bilateral, all nails x 10 are well manicured.  Vascular: Dorsalis Pedis 1/4 and Posterior Tibial pedal pulses 2/4, Capillary Fill Time 3 seconds, + scant pedal hair growth bilateral, no edema bilateral lower extremities, Temperature gradient within normal limits.  Neurology: Johney Maine sensation intact via light touch bilateral. Protective and epicritic sensation intact bilateral.   Musculoskeletal: Mild pain to palpation at the callus site on right>left, pes cavus/splay foot type with fat pad atrophy.Muscular strength 5/5 in all groups without pain but limitation on range of motion from ankle fusion and arthritis on right. Grade 4 bunion on left.    Assessment and Plan: Problem List Items Addressed This Visit    None    Visit Diagnoses    Prominent metatarsal head, unspecified laterality    -  Primary   Porokeratosis       Capsulitis       Right foot pain       Metatarsalgia, right foot          -Complete examination performed -Discussed treatment options for painful porokeratosis with prominent metatarsal head -Dispensed metatarsal pad to use as instructed -Hold off on wearing custom orthotics until next visit to see if this padding made a difference and if so then we will send medical orthotics for modification -Advised patient to refrain from aggressive trimming of callus to prevent ulceration -Recommend good supportive shoes daily for foot type  -Recommend daily skin emollients and may use topical Voltaren to help ease off any discomfort across the area -Recommend use of pumice stone as needed -Patient if symptoms are not better may consider surgery removal of second metatarsal head on right if continues to be bothersome -Patient to return to office 1 month for  follow-up evaluation or sooner if condition worsens.  Landis Martins, DPM

## 2017-09-18 DIAGNOSIS — E2839 Other primary ovarian failure: Secondary | ICD-10-CM | POA: Diagnosis not present

## 2017-09-18 DIAGNOSIS — G2581 Restless legs syndrome: Secondary | ICD-10-CM | POA: Diagnosis not present

## 2017-09-18 DIAGNOSIS — E785 Hyperlipidemia, unspecified: Secondary | ICD-10-CM | POA: Diagnosis not present

## 2017-09-18 DIAGNOSIS — F419 Anxiety disorder, unspecified: Secondary | ICD-10-CM | POA: Diagnosis not present

## 2017-09-18 DIAGNOSIS — G4700 Insomnia, unspecified: Secondary | ICD-10-CM | POA: Diagnosis not present

## 2017-09-30 DIAGNOSIS — E785 Hyperlipidemia, unspecified: Secondary | ICD-10-CM | POA: Diagnosis not present

## 2017-09-30 DIAGNOSIS — G2581 Restless legs syndrome: Secondary | ICD-10-CM | POA: Diagnosis not present

## 2017-09-30 DIAGNOSIS — Z79899 Other long term (current) drug therapy: Secondary | ICD-10-CM | POA: Diagnosis not present

## 2017-09-30 DIAGNOSIS — R635 Abnormal weight gain: Secondary | ICD-10-CM | POA: Diagnosis not present

## 2017-09-30 DIAGNOSIS — R252 Cramp and spasm: Secondary | ICD-10-CM | POA: Diagnosis not present

## 2017-09-30 DIAGNOSIS — E559 Vitamin D deficiency, unspecified: Secondary | ICD-10-CM | POA: Diagnosis not present

## 2017-10-01 IMAGING — CR DG LUMBAR SPINE COMPLETE 4+V
5 series · 5 of 5 positions shown · non-contrast
Comparison: 09/23/2013

CLINICAL DATA: Low back pain for years with bilateral radiculopathy

EXAM:
LUMBAR SPINE - COMPLETE 4+ VIEW

[w lumbar spine ap]
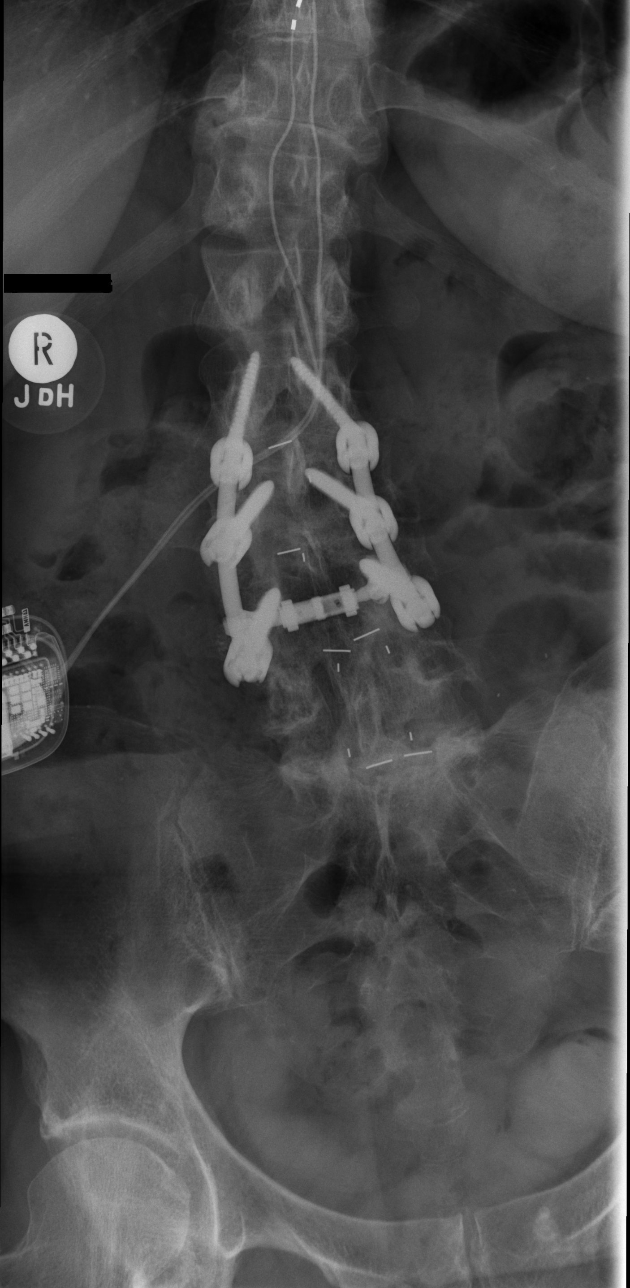

[w lumbar spine obl (1 of 2)]
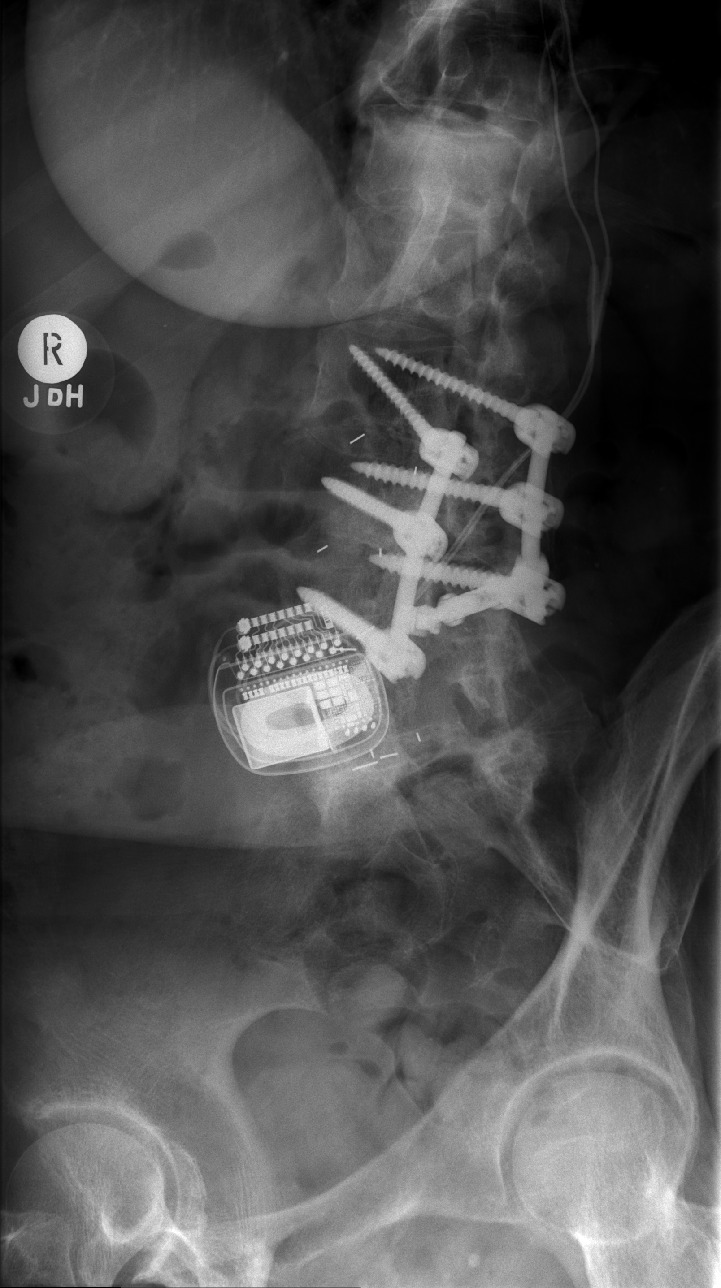

[w lumbar spine obl (2 of 2)]
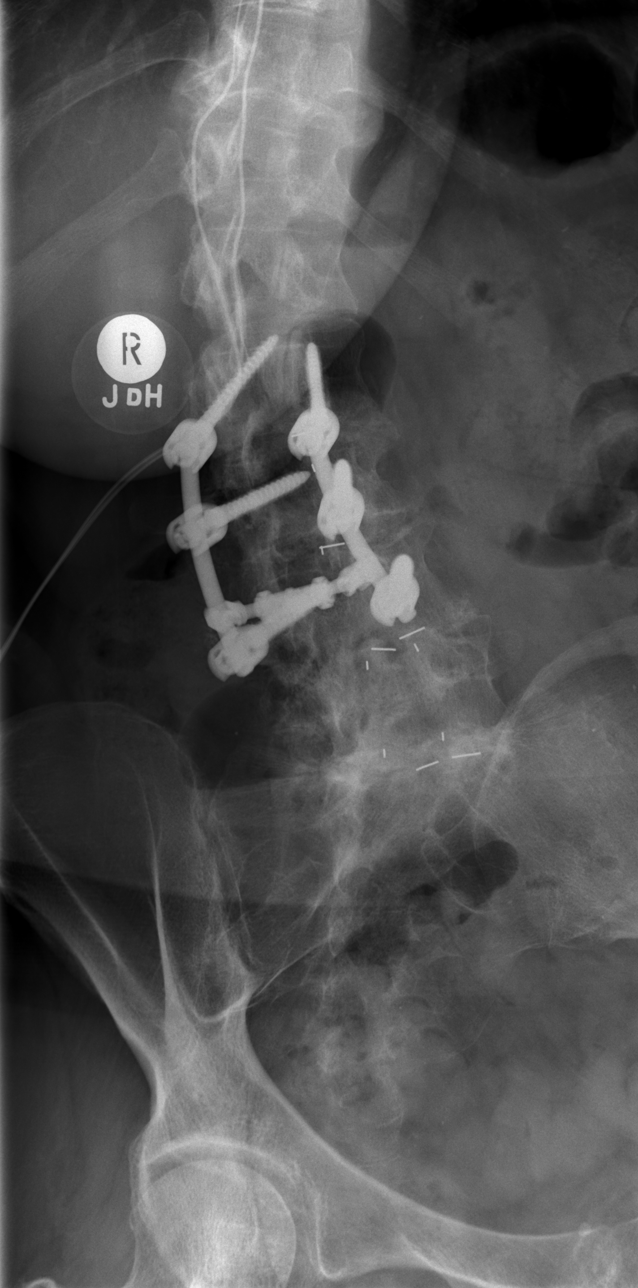

[w lumbar spine lat]
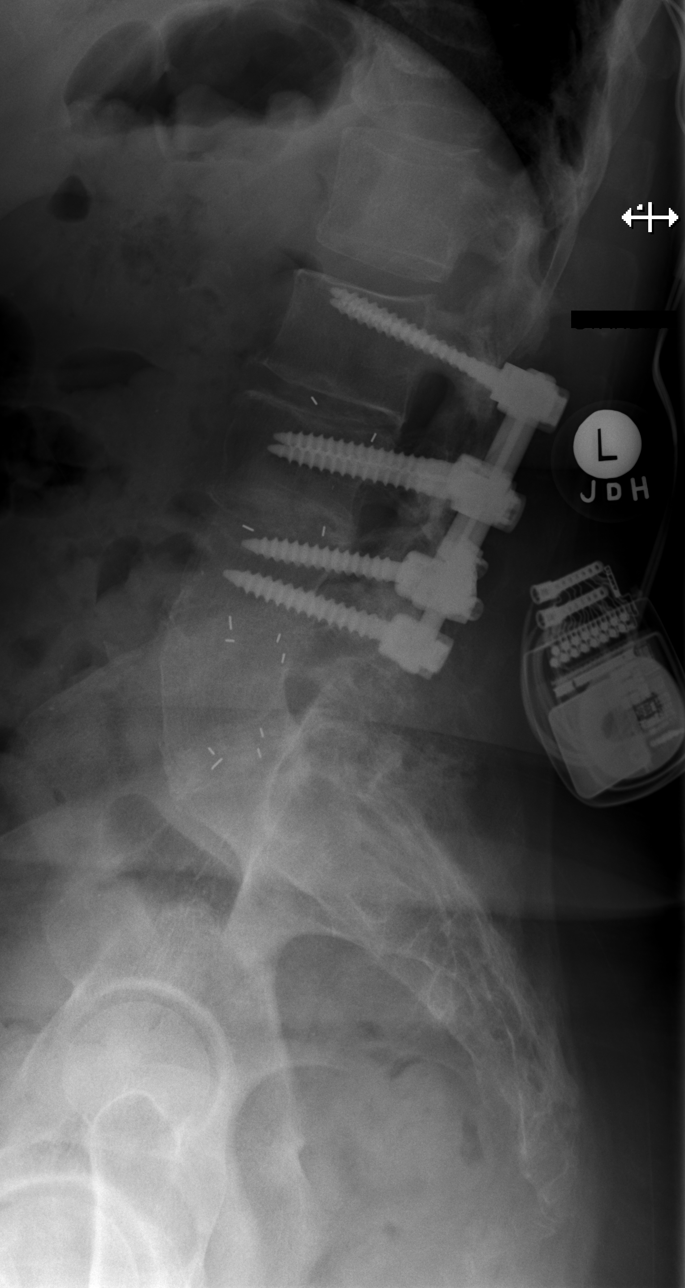

[w lumbar l-5 s-1 spot]
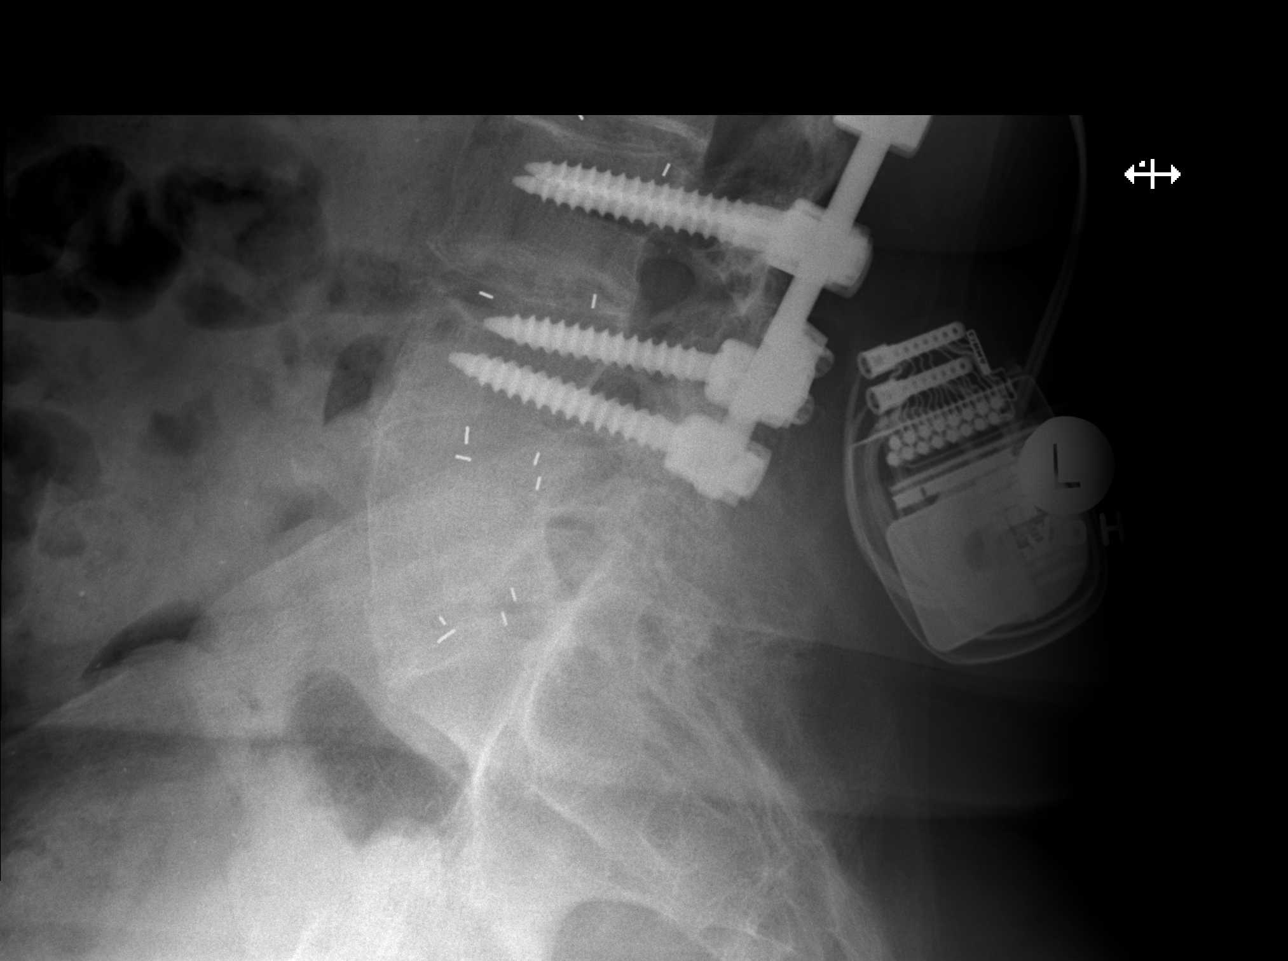

[5 of 5 positions shown; findings below may reference images not displayed]

FINDINGS: Postsurgical changes are noted from L2-L4 posteriorly stable from
the prior exam. Interbody fusion is noted at L2-3, L3-4, L4-5 and
L5-S1 all stable from the prior exam. Spinal stimulator is noted. No
acute bony abnormality is seen.
IMPRESSION: Postsurgical changes as described without acute abnormality.

## 2017-10-14 DIAGNOSIS — M79606 Pain in leg, unspecified: Secondary | ICD-10-CM | POA: Diagnosis not present

## 2017-10-14 DIAGNOSIS — Z79891 Long term (current) use of opiate analgesic: Secondary | ICD-10-CM | POA: Diagnosis not present

## 2017-10-14 DIAGNOSIS — M961 Postlaminectomy syndrome, not elsewhere classified: Secondary | ICD-10-CM | POA: Diagnosis not present

## 2017-10-14 DIAGNOSIS — G894 Chronic pain syndrome: Secondary | ICD-10-CM | POA: Diagnosis not present

## 2017-10-14 DIAGNOSIS — Z79899 Other long term (current) drug therapy: Secondary | ICD-10-CM | POA: Diagnosis not present

## 2017-10-14 DIAGNOSIS — M542 Cervicalgia: Secondary | ICD-10-CM | POA: Diagnosis not present

## 2017-10-16 ENCOUNTER — Ambulatory Visit: Payer: BLUE CROSS/BLUE SHIELD | Admitting: Sports Medicine

## 2017-10-16 ENCOUNTER — Encounter: Payer: Self-pay | Admitting: Sports Medicine

## 2017-10-16 DIAGNOSIS — M7741 Metatarsalgia, right foot: Secondary | ICD-10-CM | POA: Diagnosis not present

## 2017-10-16 DIAGNOSIS — M779 Enthesopathy, unspecified: Secondary | ICD-10-CM

## 2017-10-16 DIAGNOSIS — M79671 Pain in right foot: Secondary | ICD-10-CM

## 2017-10-16 DIAGNOSIS — M19071 Primary osteoarthritis, right ankle and foot: Secondary | ICD-10-CM

## 2017-10-16 DIAGNOSIS — Q828 Other specified congenital malformations of skin: Secondary | ICD-10-CM

## 2017-10-16 DIAGNOSIS — M216X9 Other acquired deformities of unspecified foot: Secondary | ICD-10-CM | POA: Diagnosis not present

## 2017-10-16 DIAGNOSIS — M722 Plantar fascial fibromatosis: Secondary | ICD-10-CM | POA: Diagnosis not present

## 2017-10-16 NOTE — Progress Notes (Signed)
Patient ID: DALILAH CURLIN, female   DOB: 03/10/1956, 62 y.o.   MRN: [id number] Subjective: Sonya Allen is a 62 y.o. female patient who returns to office for evaluation of Right>Left foot pain. Patient reports that the metatarsal padding and topical pain cream helped for her right foot.  Patient is also trimming her calluses and aggressively trimmed on last week and accidentally nicked herself however denies nausea vomiting fever chills swelling or any other acute symptoms.  Patient reports that she is doing okay. Patient denies any other pedal complaints.   Patient Active Problem List   Diagnosis Date Noted  . 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 betamethasone acetate-betamethasone sodium phosphate (CELESTONE) 6 (3-3) MG/ML injection Inject 12 mg into the articular space.    . bupivacaine, PF, (MARCAINE) 0.25 % SOLN injection Inject 25 mg into the skin.    . carisoprodol (SOMA) 350 MG tablet Take 350 mg by mouth 3 (three) times daily.    . carisoprodol (SOMA) 350 MG tablet     . clonazePAM (KLONOPIN) 0.5 MG tablet Take 0.5 mg by mouth at bedtime. Restless legs    . clonazePAM (KLONOPIN) 0.5 MG tablet     . conjugated estrogens (PREMARIN) vaginal cream     . docusate sodium (COLACE) 100 MG capsule Take 100 mg by mouth daily.    Marland Kitchen enoxaparin (LOVENOX) 40 MG/0.4ML injection Inject 0.4 mLs (40 mg total) into the skin daily. (Patient not taking: Reported on 09/04/2017) 12 Syringe 0  . furosemide (LASIX) 20 MG tablet Take 20 mg by mouth daily.    . furosemide (LASIX) 20 MG tablet     . GRALISE 600 MG TABS     . HYDROmorphone HCl (EXALGO) 12 MG T24A SR tablet     . Hylan 48 MG/6ML SOSY Inject 48 mg into the articular space.    Marland Kitchen ibuprofen (ADVIL,MOTRIN) 800 MG tablet     . methocarbamol (ROBAXIN) 500 MG tablet Take 1-2 tablets (500-1,000 mg total) by mouth every 6 (six) hours as needed for muscle spasms.  (Patient not taking: Reported on 09/04/2017) 60 tablet 2  . morphine (MS CONTIN) 30 MG 12 hr tablet Take by mouth.    . morphine (MS CONTIN) 60 MG 12 hr tablet Take 60 mg by mouth every 12 (twelve) hours.    Marland Kitchen MOVANTIK 12.5 MG TABS     . NUVIGIL 150 MG tablet     . ondansetron (ZOFRAN) 4 MG tablet     . ondansetron (ZOFRAN) 4 MG tablet     . oxycodone (ROXICODONE) 30 MG immediate release tablet Take 1 tablet (30 mg total) by mouth every 8 (eight) hours as needed (for breakthrough pain). (Patient not taking: Reported on 09/04/2017) 100 tablet 0  . oxyCODONE-acetaminophen (PERCOCET) 10-325 MG tablet Take 1 tablet by mouth every 4 (four) hours as needed for pain.    Vladimir Faster Glycol-Propyl Glycol (SYSTANE OP) Apply 1 drop to eye 2 (two) times daily as needed (dry eyes).    . pravastatin (PRAVACHOL) 40 MG tablet Take 40 mg by mouth daily.    . pravastatin (PRAVACHOL) 40 MG tablet Take by mouth.    . PredniSONE 10 MG KIT Take as instructed for 12 days and a tapering dose (Patient not taking: Reported on 01/23/2017) 48 each 0  . pregabalin (LYRICA) 150 MG capsule Take 150 mg by mouth daily.    Marland Kitchen  promethazine (PHENERGAN) 12.5 MG tablet     . tiZANidine (ZANAFLEX) 4 MG tablet     . topiramate (TOPAMAX) 50 MG tablet     . venlafaxine XR (EFFEXOR-XR) 150 MG 24 hr capsule Take 150 mg by mouth daily.    Marland Kitchen venlafaxine XR (EFFEXOR-XR) 150 MG 24 hr capsule     . Vitamin D, Ergocalciferol, (DRISDOL) 50000 units CAPS capsule     . ZETIA 10 MG tablet      Current Facility-Administered Medications on File Prior to Visit  Medication Dose Route Frequency Provider Last Rate Last Dose  . triamcinolone acetonide (KENALOG) 10 MG/ML injection 10 mg  10 mg Other Once Landis Martins, DPM      . triamcinolone acetonide (KENALOG) 10 MG/ML injection 10 mg  10 mg Other Once Landis Martins, DPM        Allergies  Allergen Reactions  . Augmentin [Amoxicillin-Pot Clavulanate] Diarrhea    Objective:  General: Alert and  oriented x3 in no acute distress  Dermatology: Minimal keratotic lesion present measuring <0.5 cm with skin lines transversing the lesion, minimal pain is present with direct pressure to the lesion, central nucleated core noted suggestive of porokeratosis, plantar right sub met 1 and 2>left sub met 1 with no infection, no webspace macerations, no ecchymosis bilateral, all nails x 10 are well manicured.  Vascular: Dorsalis Pedis 1/4 and Posterior Tibial pedal pulses 2/4, Capillary Fill Time 3 seconds, + scant pedal hair growth bilateral, no edema bilateral lower extremities, Temperature gradient within normal limits.  Neurology: Johney Maine sensation intact via light touch bilateral. Protective and epicritic sensation intact bilateral.   Musculoskeletal: Mild pain to palpation at the callus site on right>left, splay foot type with fat pad atrophy.Muscular strength 5/5 in all groups without pain but limitation on range of motion from ankle fusion and arthritis on right. Grade 4 bunion on left.    Assessment and Plan: Problem List Items Addressed This Visit    None    Visit Diagnoses    Prominent metatarsal head, unspecified laterality    -  Primary   Porokeratosis       Capsulitis       Metatarsalgia, right foot       Right foot pain       Plantar fasciitis       Arthritis of right foot          -Complete examination performed -Discussed continued care for painful porokeratosis with prominent metatarsal heads -Continue with metatarsal padding until modified orthotics are received -Patient to drop off her current orthotics for [place] to see him back for modification of adding additional padding and forefoot to offload submet 1 and 2 -Advised patient to refrain from aggressive trimming of callus to prevent ulceration as previous -Recommend good supportive shoes daily for foot type  -Recommend daily skin emollients and may use topical Voltaren to help ease off any discomfort across the area as  previous -Recommend use of pumice stone as needed as previous -Patient to return to office pickup orthotics for follow-up evaluation or sooner if condition worsens.  Landis Martins, DPM

## 2017-10-18 DIAGNOSIS — N3000 Acute cystitis without hematuria: Secondary | ICD-10-CM | POA: Diagnosis not present

## 2017-10-18 DIAGNOSIS — R300 Dysuria: Secondary | ICD-10-CM | POA: Diagnosis not present

## 2017-10-29 DIAGNOSIS — M1711 Unilateral primary osteoarthritis, right knee: Secondary | ICD-10-CM | POA: Diagnosis not present

## 2017-10-30 DIAGNOSIS — E785 Hyperlipidemia, unspecified: Secondary | ICD-10-CM | POA: Diagnosis not present

## 2017-10-30 DIAGNOSIS — G4700 Insomnia, unspecified: Secondary | ICD-10-CM | POA: Diagnosis not present

## 2017-10-30 DIAGNOSIS — M8580 Other specified disorders of bone density and structure, unspecified site: Secondary | ICD-10-CM | POA: Diagnosis not present

## 2017-10-30 DIAGNOSIS — F419 Anxiety disorder, unspecified: Secondary | ICD-10-CM | POA: Diagnosis not present

## 2017-11-11 DIAGNOSIS — M961 Postlaminectomy syndrome, not elsewhere classified: Secondary | ICD-10-CM | POA: Diagnosis not present

## 2017-11-11 DIAGNOSIS — G894 Chronic pain syndrome: Secondary | ICD-10-CM | POA: Diagnosis not present

## 2017-11-11 DIAGNOSIS — Z79899 Other long term (current) drug therapy: Secondary | ICD-10-CM | POA: Diagnosis not present

## 2017-11-11 DIAGNOSIS — Z79891 Long term (current) use of opiate analgesic: Secondary | ICD-10-CM | POA: Diagnosis not present

## 2017-11-11 DIAGNOSIS — M79606 Pain in leg, unspecified: Secondary | ICD-10-CM | POA: Diagnosis not present

## 2017-11-11 DIAGNOSIS — M542 Cervicalgia: Secondary | ICD-10-CM | POA: Diagnosis not present

## 2017-11-27 DIAGNOSIS — M8580 Other specified disorders of bone density and structure, unspecified site: Secondary | ICD-10-CM | POA: Diagnosis not present

## 2017-11-27 DIAGNOSIS — Z1231 Encounter for screening mammogram for malignant neoplasm of breast: Secondary | ICD-10-CM | POA: Diagnosis not present

## 2017-11-27 DIAGNOSIS — E2839 Other primary ovarian failure: Secondary | ICD-10-CM | POA: Diagnosis not present

## 2017-12-02 DIAGNOSIS — M8589 Other specified disorders of bone density and structure, multiple sites: Secondary | ICD-10-CM | POA: Diagnosis not present

## 2017-12-02 DIAGNOSIS — M8580 Other specified disorders of bone density and structure, unspecified site: Secondary | ICD-10-CM | POA: Diagnosis not present

## 2017-12-02 DIAGNOSIS — E2839 Other primary ovarian failure: Secondary | ICD-10-CM | POA: Diagnosis not present

## 2017-12-14 DIAGNOSIS — G894 Chronic pain syndrome: Secondary | ICD-10-CM | POA: Diagnosis not present

## 2017-12-14 DIAGNOSIS — M79606 Pain in leg, unspecified: Secondary | ICD-10-CM | POA: Diagnosis not present

## 2017-12-14 DIAGNOSIS — M542 Cervicalgia: Secondary | ICD-10-CM | POA: Diagnosis not present

## 2017-12-14 DIAGNOSIS — M961 Postlaminectomy syndrome, not elsewhere classified: Secondary | ICD-10-CM | POA: Diagnosis not present

## 2017-12-17 DIAGNOSIS — M1711 Unilateral primary osteoarthritis, right knee: Secondary | ICD-10-CM | POA: Diagnosis not present

## 2017-12-17 DIAGNOSIS — G8929 Other chronic pain: Secondary | ICD-10-CM | POA: Diagnosis not present

## 2017-12-31 DIAGNOSIS — M2351 Chronic instability of knee, right knee: Secondary | ICD-10-CM

## 2017-12-31 DIAGNOSIS — M1711 Unilateral primary osteoarthritis, right knee: Secondary | ICD-10-CM | POA: Diagnosis not present

## 2017-12-31 HISTORY — DX: Chronic instability of knee, right knee: M23.51

## 2018-01-04 DIAGNOSIS — M25561 Pain in right knee: Secondary | ICD-10-CM | POA: Diagnosis not present

## 2018-01-04 DIAGNOSIS — M1711 Unilateral primary osteoarthritis, right knee: Secondary | ICD-10-CM | POA: Diagnosis not present

## 2018-01-07 DIAGNOSIS — M1711 Unilateral primary osteoarthritis, right knee: Secondary | ICD-10-CM | POA: Diagnosis not present

## 2018-01-07 DIAGNOSIS — S83281A Other tear of lateral meniscus, current injury, right knee, initial encounter: Secondary | ICD-10-CM | POA: Diagnosis not present

## 2018-01-11 DIAGNOSIS — M961 Postlaminectomy syndrome, not elsewhere classified: Secondary | ICD-10-CM | POA: Diagnosis not present

## 2018-01-11 DIAGNOSIS — M25569 Pain in unspecified knee: Secondary | ICD-10-CM | POA: Diagnosis not present

## 2018-01-11 DIAGNOSIS — Z79891 Long term (current) use of opiate analgesic: Secondary | ICD-10-CM | POA: Diagnosis not present

## 2018-01-11 DIAGNOSIS — G894 Chronic pain syndrome: Secondary | ICD-10-CM | POA: Diagnosis not present

## 2018-01-11 DIAGNOSIS — Z79899 Other long term (current) drug therapy: Secondary | ICD-10-CM | POA: Diagnosis not present

## 2018-01-11 DIAGNOSIS — M542 Cervicalgia: Secondary | ICD-10-CM | POA: Diagnosis not present

## 2018-01-27 DIAGNOSIS — M659 Synovitis and tenosynovitis, unspecified: Secondary | ICD-10-CM | POA: Diagnosis not present

## 2018-01-27 DIAGNOSIS — M94261 Chondromalacia, right knee: Secondary | ICD-10-CM | POA: Diagnosis not present

## 2018-01-27 DIAGNOSIS — X58XXXA Exposure to other specified factors, initial encounter: Secondary | ICD-10-CM | POA: Diagnosis not present

## 2018-01-27 DIAGNOSIS — S83271A Complex tear of lateral meniscus, current injury, right knee, initial encounter: Secondary | ICD-10-CM | POA: Diagnosis not present

## 2018-01-27 DIAGNOSIS — S83261A Peripheral tear of lateral meniscus, current injury, right knee, initial encounter: Secondary | ICD-10-CM | POA: Diagnosis not present

## 2018-01-27 DIAGNOSIS — Y999 Unspecified external cause status: Secondary | ICD-10-CM | POA: Diagnosis not present

## 2018-01-27 DIAGNOSIS — M6751 Plica syndrome, right knee: Secondary | ICD-10-CM | POA: Diagnosis not present

## 2018-01-27 DIAGNOSIS — G8918 Other acute postprocedural pain: Secondary | ICD-10-CM | POA: Diagnosis not present

## 2018-02-01 DIAGNOSIS — R509 Fever, unspecified: Secondary | ICD-10-CM | POA: Diagnosis not present

## 2018-02-08 DIAGNOSIS — M62551 Muscle wasting and atrophy, not elsewhere classified, right thigh: Secondary | ICD-10-CM | POA: Diagnosis not present

## 2018-02-08 DIAGNOSIS — M25561 Pain in right knee: Secondary | ICD-10-CM | POA: Diagnosis not present

## 2018-02-10 DIAGNOSIS — M542 Cervicalgia: Secondary | ICD-10-CM | POA: Diagnosis not present

## 2018-02-10 DIAGNOSIS — M25569 Pain in unspecified knee: Secondary | ICD-10-CM | POA: Diagnosis not present

## 2018-02-10 DIAGNOSIS — Z79891 Long term (current) use of opiate analgesic: Secondary | ICD-10-CM | POA: Diagnosis not present

## 2018-02-10 DIAGNOSIS — M961 Postlaminectomy syndrome, not elsewhere classified: Secondary | ICD-10-CM | POA: Diagnosis not present

## 2018-02-10 DIAGNOSIS — Z79899 Other long term (current) drug therapy: Secondary | ICD-10-CM | POA: Diagnosis not present

## 2018-02-10 DIAGNOSIS — G894 Chronic pain syndrome: Secondary | ICD-10-CM | POA: Diagnosis not present

## 2018-03-12 DIAGNOSIS — M545 Low back pain: Secondary | ICD-10-CM | POA: Diagnosis not present

## 2018-03-12 DIAGNOSIS — M25569 Pain in unspecified knee: Secondary | ICD-10-CM | POA: Diagnosis not present

## 2018-03-12 DIAGNOSIS — M79606 Pain in leg, unspecified: Secondary | ICD-10-CM | POA: Diagnosis not present

## 2018-03-12 DIAGNOSIS — G894 Chronic pain syndrome: Secondary | ICD-10-CM | POA: Diagnosis not present

## 2018-03-25 DIAGNOSIS — M79645 Pain in left finger(s): Secondary | ICD-10-CM | POA: Diagnosis not present

## 2018-04-12 DIAGNOSIS — M542 Cervicalgia: Secondary | ICD-10-CM | POA: Diagnosis not present

## 2018-04-12 DIAGNOSIS — M79606 Pain in leg, unspecified: Secondary | ICD-10-CM | POA: Diagnosis not present

## 2018-04-12 DIAGNOSIS — Z79899 Other long term (current) drug therapy: Secondary | ICD-10-CM | POA: Diagnosis not present

## 2018-04-12 DIAGNOSIS — Z79891 Long term (current) use of opiate analgesic: Secondary | ICD-10-CM | POA: Diagnosis not present

## 2018-04-12 DIAGNOSIS — M961 Postlaminectomy syndrome, not elsewhere classified: Secondary | ICD-10-CM | POA: Diagnosis not present

## 2018-04-12 DIAGNOSIS — G894 Chronic pain syndrome: Secondary | ICD-10-CM | POA: Diagnosis not present

## 2018-05-13 DIAGNOSIS — G894 Chronic pain syndrome: Secondary | ICD-10-CM | POA: Diagnosis not present

## 2018-05-13 DIAGNOSIS — M542 Cervicalgia: Secondary | ICD-10-CM | POA: Diagnosis not present

## 2018-05-13 DIAGNOSIS — M961 Postlaminectomy syndrome, not elsewhere classified: Secondary | ICD-10-CM | POA: Diagnosis not present

## 2018-05-13 DIAGNOSIS — M79606 Pain in leg, unspecified: Secondary | ICD-10-CM | POA: Diagnosis not present

## 2018-05-27 ENCOUNTER — Ambulatory Visit: Payer: BLUE CROSS/BLUE SHIELD | Admitting: Sports Medicine

## 2018-05-28 DIAGNOSIS — G4700 Insomnia, unspecified: Secondary | ICD-10-CM | POA: Diagnosis not present

## 2018-05-28 DIAGNOSIS — F39 Unspecified mood [affective] disorder: Secondary | ICD-10-CM | POA: Diagnosis not present

## 2018-05-28 DIAGNOSIS — Z79899 Other long term (current) drug therapy: Secondary | ICD-10-CM | POA: Diagnosis not present

## 2018-05-28 DIAGNOSIS — E785 Hyperlipidemia, unspecified: Secondary | ICD-10-CM | POA: Diagnosis not present

## 2018-05-28 DIAGNOSIS — E663 Overweight: Secondary | ICD-10-CM | POA: Diagnosis not present

## 2018-06-07 DIAGNOSIS — H1132 Conjunctival hemorrhage, left eye: Secondary | ICD-10-CM | POA: Diagnosis not present

## 2018-06-14 DIAGNOSIS — N949 Unspecified condition associated with female genital organs and menstrual cycle: Secondary | ICD-10-CM | POA: Diagnosis not present

## 2018-06-14 DIAGNOSIS — Z79891 Long term (current) use of opiate analgesic: Secondary | ICD-10-CM | POA: Diagnosis not present

## 2018-06-14 DIAGNOSIS — Z79899 Other long term (current) drug therapy: Secondary | ICD-10-CM | POA: Diagnosis not present

## 2018-06-14 DIAGNOSIS — Z09 Encounter for follow-up examination after completed treatment for conditions other than malignant neoplasm: Secondary | ICD-10-CM | POA: Diagnosis not present

## 2018-06-14 DIAGNOSIS — M79606 Pain in leg, unspecified: Secondary | ICD-10-CM | POA: Diagnosis not present

## 2018-06-14 DIAGNOSIS — G894 Chronic pain syndrome: Secondary | ICD-10-CM | POA: Diagnosis not present

## 2018-06-14 DIAGNOSIS — M542 Cervicalgia: Secondary | ICD-10-CM | POA: Diagnosis not present

## 2018-06-14 DIAGNOSIS — M961 Postlaminectomy syndrome, not elsewhere classified: Secondary | ICD-10-CM | POA: Diagnosis not present

## 2018-06-16 DIAGNOSIS — M899 Disorder of bone, unspecified: Secondary | ICD-10-CM | POA: Diagnosis not present

## 2018-06-23 ENCOUNTER — Encounter: Payer: BLUE CROSS/BLUE SHIELD | Admitting: Sports Medicine

## 2018-06-24 NOTE — Progress Notes (Signed)
Patient cancelled appointment.   This encounter was created in error - please disregard. 

## 2018-06-25 ENCOUNTER — Encounter: Payer: Self-pay | Admitting: Sports Medicine

## 2018-06-25 ENCOUNTER — Ambulatory Visit (INDEPENDENT_AMBULATORY_CARE_PROVIDER_SITE_OTHER): Payer: BLUE CROSS/BLUE SHIELD

## 2018-06-25 ENCOUNTER — Ambulatory Visit: Payer: BLUE CROSS/BLUE SHIELD | Admitting: Sports Medicine

## 2018-06-25 DIAGNOSIS — M7741 Metatarsalgia, right foot: Secondary | ICD-10-CM

## 2018-06-25 DIAGNOSIS — Q828 Other specified congenital malformations of skin: Secondary | ICD-10-CM

## 2018-06-25 DIAGNOSIS — M79671 Pain in right foot: Secondary | ICD-10-CM

## 2018-06-25 DIAGNOSIS — M779 Enthesopathy, unspecified: Secondary | ICD-10-CM

## 2018-06-25 DIAGNOSIS — M216X9 Other acquired deformities of unspecified foot: Secondary | ICD-10-CM

## 2018-06-25 MED ORDER — TRIAMCINOLONE ACETONIDE 10 MG/ML IJ SUSP: 10.0000 mg | Freq: Once | INTRAMUSCULAR | Status: DC

## 2018-06-25 NOTE — Progress Notes (Signed)
Patient ID: Sonya Allen, female   DOB: 1956-04-09, 62 y.o.   MRN: [id number] Subjective: Sonya Allen is a 62 y.o. female patient who returns to office for evaluation of right foot pain.  Patient reports that the metatarsal padding and topical pain cream helped for her right foot but now it seems like over the last few months it is flaring back up again states that that is worse with a lot of walking states that pain is 5 out of 10 has been using her orthotics and padding and Voltaren gel with some relief.  Patient denies open wound, nausea, vomiting, fever, chills or any other constitutional symptoms at this time. Patient denies any other pedal complaints.   Patient Active Problem List   Diagnosis Date Noted  . 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 betamethasone acetate-betamethasone sodium phosphate (CELESTONE) 6 (3-3) MG/ML injection Inject 12 mg into the articular space.    . bupivacaine, PF, (MARCAINE) 0.25 % SOLN injection Inject 25 mg into the skin.    . carisoprodol (SOMA) 350 MG tablet Take 350 mg by mouth 3 (three) times daily.    . carisoprodol (SOMA) 350 MG tablet     . clonazePAM (KLONOPIN) 0.5 MG tablet Take 0.5 mg by mouth at bedtime. Restless legs    . clonazePAM (KLONOPIN) 0.5 MG tablet     . conjugated estrogens (PREMARIN) vaginal cream     . docusate sodium (COLACE) 100 MG capsule Take 100 mg by mouth daily.    Marland Kitchen enoxaparin (LOVENOX) 40 MG/0.4ML injection Inject 0.4 mLs (40 mg total) into the skin daily. 12 Syringe 0  . furosemide (LASIX) 20 MG tablet Take 20 mg by mouth daily.    . furosemide (LASIX) 20 MG tablet     . GRALISE 600 MG TABS     . HYDROmorphone HCl (EXALGO) 12 MG T24A SR tablet     . Hylan 48 MG/6ML SOSY Inject 48 mg into the articular space.    Marland Kitchen ibuprofen (ADVIL,MOTRIN) 800 MG tablet     . methocarbamol (ROBAXIN) 500 MG tablet Take 1-2 tablets (500-1,000 mg  total) by mouth every 6 (six) hours as needed for muscle spasms. 60 tablet 2  . morphine (MS CONTIN) 30 MG 12 hr tablet Take by mouth.    . morphine (MS CONTIN) 60 MG 12 hr tablet Take 60 mg by mouth every 12 (twelve) hours.    Marland Kitchen MOVANTIK 12.5 MG TABS     . NUVIGIL 150 MG tablet     . ondansetron (ZOFRAN) 4 MG tablet     . ondansetron (ZOFRAN) 4 MG tablet     . oxycodone (ROXICODONE) 30 MG immediate release tablet Take 1 tablet (30 mg total) by mouth every 8 (eight) hours as needed (for breakthrough pain). 100 tablet 0  . oxyCODONE-acetaminophen (PERCOCET) 10-325 MG tablet Take 1 tablet by mouth every 4 (four) hours as needed for pain.    Vladimir Faster Glycol-Propyl Glycol (SYSTANE OP) Apply 1 drop to eye 2 (two) times daily as needed (dry eyes).    . pravastatin (PRAVACHOL) 40 MG tablet Take 40 mg by mouth daily.    . pravastatin (PRAVACHOL) 40 MG tablet Take by mouth.    . PredniSONE 10 MG KIT Take as instructed for 12 days and a tapering dose 48 each 0  . pregabalin (LYRICA) 150 MG capsule Take 150 mg by mouth  daily.    . promethazine (PHENERGAN) 12.5 MG tablet     . tiZANidine (ZANAFLEX) 4 MG tablet     . topiramate (TOPAMAX) 50 MG tablet     . venlafaxine XR (EFFEXOR-XR) 150 MG 24 hr capsule Take 150 mg by mouth daily.    Marland Kitchen venlafaxine XR (EFFEXOR-XR) 150 MG 24 hr capsule     . Vitamin D, Ergocalciferol, (DRISDOL) 50000 units CAPS capsule     . ZETIA 10 MG tablet      Current Facility-Administered Medications on File Prior to Visit  Medication Dose Route Frequency Provider Last Rate Last Dose  . triamcinolone acetonide (KENALOG) 10 MG/ML injection 10 mg  10 mg Other Once Landis Martins, DPM      . triamcinolone acetonide (KENALOG) 10 MG/ML injection 10 mg  10 mg Other Once Landis Martins, DPM        Allergies  Allergen Reactions  . Pravastatin Sodium Other (See Comments)    Muscles  aches Muscles  aches   . Augmentin [Amoxicillin-Pot Clavulanate] Diarrhea    Objective:   General: Alert and oriented x3 in no acute distress  Dermatology: Minimal keratotic lesion present measuring <0.5 cm with skin lines transversing the lesion, minimal pain is present with direct pressure to the lesion, central nucleated core noted suggestive of porokeratosis, plantar right sub met 1 and 2 with no infection, no webspace macerations, no ecchymosis bilateral, all nails x 10 are well manicured.  Vascular: Dorsalis Pedis 1/4 and Posterior Tibial pedal pulses 2/4, Capillary Fill Time 3 seconds, + scant pedal hair growth bilateral, no edema bilateral lower extremities, Temperature gradient within normal limits.  Neurology: Johney Maine sensation intact via light touch bilateral. Protective and epicritic sensation intact bilateral.   Musculoskeletal: Mild pain to palpation at the callus site on right>left, splay foot type with fat pad atrophy with prominent metatarsal heads especially the right first and second.Muscular strength 5/5 in all groups without pain but limitation on range of motion from ankle fusion and arthritis on right. Grade 4 bunion on left.  X-ray right foot consistent with diffuse arthritis and hardware that is intact from previous fusion of the foot and ankle no other acute findings.    Assessment and Plan: Problem List Items Addressed This Visit    None    Visit Diagnoses    Right foot pain    -  Primary   Relevant Orders   DG Foot Complete Right   Capsulitis       Relevant Medications   triamcinolone acetonide (KENALOG) 10 MG/ML injection 10 mg   Prominent metatarsal head, unspecified laterality       Porokeratosis       Metatarsalgia, right foot          -Complete examination performed -Discussed continued care for painful porokeratosis with prominent metatarsal heads with inflammation -After oral consent and aseptic prep, injected a mixture containing 1 ml of 2%  plain lidocaine, 1 ml 0.5% plain marcaine, 0.5 ml of kenalog 10 and 0.5 ml of dexamethasone  phosphate into second metatarsal phalangeal joint on right foot via a plantar approach without complication. Post-injection care discussed with patient.  -Mechanically debrided callus plantar aspect of right foot x1 at no additional charge using a sterile chisel blade -Continue with custom orthotics -Recommend good supportive shoes daily for foot type  -Recommend continue with daily skin emollients and may use topical Voltaren to help ease off any discomfort across the area as previous -Recommend use of pumice stone  as needed as previous and to refrain from self trimming with sharp tools or instruments to avoid nicks. -Patient to return to office as needed or sooner if condition worsens.  Landis Martins, DPM

## 2018-06-28 ENCOUNTER — Other Ambulatory Visit: Payer: Self-pay | Admitting: Sports Medicine

## 2018-06-28 DIAGNOSIS — M7741 Metatarsalgia, right foot: Secondary | ICD-10-CM

## 2018-06-28 DIAGNOSIS — M79671 Pain in right foot: Secondary | ICD-10-CM

## 2018-06-28 DIAGNOSIS — M779 Enthesopathy, unspecified: Secondary | ICD-10-CM

## 2018-07-12 DIAGNOSIS — M961 Postlaminectomy syndrome, not elsewhere classified: Secondary | ICD-10-CM | POA: Diagnosis not present

## 2018-07-12 DIAGNOSIS — G894 Chronic pain syndrome: Secondary | ICD-10-CM | POA: Diagnosis not present

## 2018-07-12 DIAGNOSIS — M542 Cervicalgia: Secondary | ICD-10-CM | POA: Diagnosis not present

## 2018-07-12 DIAGNOSIS — M79606 Pain in leg, unspecified: Secondary | ICD-10-CM | POA: Diagnosis not present

## 2018-07-16 DIAGNOSIS — E663 Overweight: Secondary | ICD-10-CM | POA: Diagnosis not present

## 2018-07-16 DIAGNOSIS — D692 Other nonthrombocytopenic purpura: Secondary | ICD-10-CM | POA: Diagnosis not present

## 2018-07-16 DIAGNOSIS — M549 Dorsalgia, unspecified: Secondary | ICD-10-CM | POA: Diagnosis not present

## 2018-07-16 DIAGNOSIS — F39 Unspecified mood [affective] disorder: Secondary | ICD-10-CM | POA: Diagnosis not present

## 2018-07-19 DIAGNOSIS — R238 Other skin changes: Secondary | ICD-10-CM | POA: Diagnosis not present

## 2018-07-19 DIAGNOSIS — R769 Abnormal immunological finding in serum, unspecified: Secondary | ICD-10-CM | POA: Diagnosis not present

## 2018-08-06 ENCOUNTER — Telehealth: Payer: Self-pay | Admitting: Sports Medicine

## 2018-08-06 ENCOUNTER — Ambulatory Visit: Payer: BLUE CROSS/BLUE SHIELD | Admitting: Sports Medicine

## 2018-08-06 ENCOUNTER — Encounter: Payer: Self-pay | Admitting: Sports Medicine

## 2018-08-06 DIAGNOSIS — M779 Enthesopathy, unspecified: Secondary | ICD-10-CM

## 2018-08-06 DIAGNOSIS — M2011 Hallux valgus (acquired), right foot: Secondary | ICD-10-CM

## 2018-08-06 DIAGNOSIS — M216X9 Other acquired deformities of unspecified foot: Secondary | ICD-10-CM

## 2018-08-06 DIAGNOSIS — M79671 Pain in right foot: Secondary | ICD-10-CM

## 2018-08-06 DIAGNOSIS — M7741 Metatarsalgia, right foot: Secondary | ICD-10-CM | POA: Diagnosis not present

## 2018-08-06 DIAGNOSIS — M899 Disorder of bone, unspecified: Secondary | ICD-10-CM | POA: Diagnosis not present

## 2018-08-06 NOTE — Progress Notes (Signed)
Patient ID: Sonya Allen, female   DOB: 07-04-1956, 62 y.o.   MRN: [id number] Subjective: Sonya Allen is a 62 y.o. female patient who returns to office for evaluation of right foot pain.  Patient reports that the last injection did not do any good for the pain the pain is still there and that shot dulled it but did not take the pain away pain is constant and sharp 5 out of 10.  Patient has been consistently using her orthotics and taking them from shoe to shoe using her pumice stone and Voltaren gel with no improvement in symptoms.  Patient states that she wants [place] to consider if there is something more that could be done for her foot.  Patient denies any other pedal complaints.   Patient Active Problem List   Diagnosis Date Noted  . 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 betamethasone acetate-betamethasone sodium phosphate (CELESTONE) 6 (3-3) MG/ML injection Inject 12 mg into the articular space.    . bupivacaine, PF, (MARCAINE) 0.25 % SOLN injection Inject 25 mg into the skin.    . carisoprodol (SOMA) 350 MG tablet Take 350 mg by mouth 3 (three) times daily.    . carisoprodol (SOMA) 350 MG tablet     . clonazePAM (KLONOPIN) 0.5 MG tablet Take 0.5 mg by mouth at bedtime. Restless legs    . clonazePAM (KLONOPIN) 0.5 MG tablet     . conjugated estrogens (PREMARIN) vaginal cream     . docusate sodium (COLACE) 100 MG capsule Take 100 mg by mouth daily.    Marland Kitchen enoxaparin (LOVENOX) 40 MG/0.4ML injection Inject 0.4 mLs (40 mg total) into the skin daily. 12 Syringe 0  . furosemide (LASIX) 20 MG tablet Take 20 mg by mouth daily.    . furosemide (LASIX) 20 MG tablet     . GRALISE 600 MG TABS     . HYDROmorphone HCl (EXALGO) 12 MG T24A SR tablet     . Hylan 48 MG/6ML SOSY Inject 48 mg into the articular space.    Marland Kitchen ibuprofen (ADVIL,MOTRIN) 800 MG tablet     . Lifitegrast (XIIDRA) 5 % SOLN     . LINZESS 290  MCG CAPS capsule     . methocarbamol (ROBAXIN) 500 MG tablet Take 1-2 tablets (500-1,000 mg total) by mouth every 6 (six) hours as needed for muscle spasms. 60 tablet 2  . morphine (MS CONTIN) 30 MG 12 hr tablet Take by mouth.    . morphine (MS CONTIN) 60 MG 12 hr tablet Take 60 mg by mouth every 12 (twelve) hours.    Marland Kitchen MOVANTIK 12.5 MG TABS     . NUVIGIL 150 MG tablet     . ondansetron (ZOFRAN) 4 MG tablet     . ondansetron (ZOFRAN) 4 MG tablet     . oxycodone (ROXICODONE) 30 MG immediate release tablet Take 1 tablet (30 mg total) by mouth every 8 (eight) hours as needed (for breakthrough pain). 100 tablet 0  . oxyCODONE-acetaminophen (PERCOCET) 10-325 MG tablet Take 1 tablet by mouth every 4 (four) hours as needed for pain.    Vladimir Faster Glycol-Propyl Glycol (SYSTANE OP) Apply 1 drop to eye 2 (two) times daily as needed (dry eyes).    . pravastatin (PRAVACHOL) 40 MG tablet Take 40 mg by mouth daily.    . pravastatin (PRAVACHOL) 40 MG tablet Take by mouth.    Marland Kitchen  PredniSONE 10 MG KIT Take as instructed for 12 days and a tapering dose 48 each 0  . pregabalin (LYRICA) 100 MG capsule Take by mouth daily.  0  . pregabalin (LYRICA) 150 MG capsule Take 150 mg by mouth daily.    . promethazine (PHENERGAN) 12.5 MG tablet     . Suvorexant (BELSOMRA) 15 MG TABS     . tiZANidine (ZANAFLEX) 4 MG tablet     . topiramate (TOPAMAX) 50 MG tablet     . venlafaxine XR (EFFEXOR-XR) 150 MG 24 hr capsule Take 150 mg by mouth daily.    Marland Kitchen venlafaxine XR (EFFEXOR-XR) 150 MG 24 hr capsule     . Vitamin D, Ergocalciferol, (DRISDOL) 50000 units CAPS capsule     . ZETIA 10 MG tablet      Current Facility-Administered Medications on File Prior to Visit  Medication Dose Route Frequency Provider Last Rate Last Dose  . triamcinolone acetonide (KENALOG) 10 MG/ML injection 10 mg  10 mg Other Once Landis Martins, DPM      . triamcinolone acetonide (KENALOG) 10 MG/ML injection 10 mg  10 mg Other Once Landis Martins, DPM       . triamcinolone acetonide (KENALOG) 10 MG/ML injection 10 mg  10 mg Other Once Landis Martins, DPM        Allergies  Allergen Reactions  . Pravastatin Sodium Other (See Comments)    Muscles  aches Muscles  aches   . Augmentin [Amoxicillin-Pot Clavulanate] Diarrhea    Objective:  General: Alert and oriented x3 in no acute distress  Dermatology: Minimal keratotic lesion present measuring <0.5 cm with skin lines transversing the lesion, minimal pain is present with direct pressure to the lesion, central nucleated core noted suggestive of porokeratosis, plantar right sub met 1 and 2 with no infection, no webspace macerations, no ecchymosis bilateral, all nails x 10 are well manicured.  Vascular: Dorsalis Pedis 1/4 and Posterior Tibial pedal pulses 2/4, Capillary Fill Time 3 seconds, + scant pedal hair growth bilateral, no edema bilateral lower extremities, Temperature gradient within normal limits.  Neurology: Johney Maine sensation intact via light touch bilateral. Protective and epicritic sensation intact bilateral.   Musculoskeletal: Mild pain to palpation at the callus site on right>left, splay foot type with fat pad atrophy with prominent metatarsal heads especially the right first especially at sesamoid complex and second metatarsal on right.Muscular strength 5/5 in all groups without pain but limitation on range of motion from ankle fusion and arthritis on right. Grade 4 bunion on left.  Hallux deviation right.    Assessment and Plan: Problem List Items Addressed This Visit    None    Visit Diagnoses    Right foot pain    -  Primary   Capsulitis       Prominent metatarsal head, unspecified laterality       Metatarsalgia, right foot       Sesamoid pain       Hallux interphalangeus, acquired, right          -Complete examination performed -Previous x-rays reviewed -Discussed continued care for painful porokeratosis with prominent metatarsal heads with inflammation -Patient  opt for surgical management. Consent obtained for removal and second metatarsal head, removal of bone/sesamoids, taken osteotomy first toe right foot.  Pre and Post op course explained. Risks, benefits, alternatives explained. No guarantees given or implied. Surgical booking slip submitted and provided patient with Surgical packet and info for [place].  Patient to call surgery scheduler for date. -To  dispense CAM Walker at surgical center patient will attempt to look for her cam boot that she already has and if she does not have it we will dispense 1 at surgical center advised patient that she will need pain or walker to help with keeping pressure off the foot -Mechanically debrided callus plantar aspect of right foot x1 at no additional charge using a sterile chisel blade -Continue with custom orthotics good supportive shoes topical Voltaren and orthotics until time for surgery -Patient to return to office as needed or sooner if condition worsens.  Landis Martins, DPM

## 2018-08-06 NOTE — Telephone Encounter (Signed)
I was told to call you and schedule surgery. I want it as soon as possible if at all possible. My cell number is [phone] or my home number is [phone]. Thank you.

## 2018-08-06 NOTE — Patient Instructions (Signed)
Pre-Operative Instructions  Congratulations, you have decided to take an important step towards improving your quality of life.  You can be assured that the doctors and staff at [facility] will be with you every step of the way.  Here are some important things you should know:  1. Plan to be at the surgery center/hospital at least 1 (one) hour prior to your scheduled time, unless otherwise directed by the surgical center/hospital staff.  You must have a responsible adult accompany you, remain during the surgery and drive you home.  Make sure you have directions to the surgical center/hospital to ensure you arrive on time. 2. If you are having surgery at [facility] or [facility] hospitals, you will need a copy of your medical history and physical form from your family physician within one month prior to the date of surgery. We will give you a form for your primary physician to complete.  3. We make every effort to accommodate the date you request for surgery.  However, there are times where surgery dates or times have to be moved.  We will contact you as soon as possible if a change in schedule is required.   4. No aspirin/ibuprofen for one week before surgery.  If you are on aspirin, any non-steroidal anti-inflammatory medications (Mobic, Aleve, Ibuprofen) should not be taken seven (7) days prior to your surgery.  You make take Tylenol for pain prior to surgery.  5. Medications - If you are taking daily heart and blood pressure medications, seizure, reflux, allergy, asthma, anxiety, pain or diabetes medications, make sure you notify the surgery center/hospital before the day of surgery so they can tell you which medications you should take or avoid the day of surgery. 6. No food or drink after midnight the night before surgery unless directed otherwise by surgical center/hospital staff. 7. No alcoholic beverages 24-hours prior to surgery.  No smoking 24-hours prior or 24-hours after  surgery. 8. Wear loose pants or shorts. They should be loose enough to fit over bandages, boots, and casts. 9. Don't wear slip-on shoes. Sneakers are preferred. 10. Bring your boot with you to the surgery center/hospital.  Also bring crutches or a walker if your physician has prescribed it for you.  If you do not have this equipment, it will be provided for you after surgery. 11. If you have not been contacted by the surgery center/hospital by the day before your surgery, call to confirm the date and time of your surgery. 12. Leave-time from work may vary depending on the type of surgery you have.  Appropriate arrangements should be made prior to surgery with your employer. 13. Prescriptions will be provided immediately following surgery by your doctor.  Fill these as soon as possible after surgery and take the medication as directed. Pain medications will not be refilled on weekends and must be approved by the doctor. 14. Remove nail polish on the operative foot and avoid getting pedicures prior to surgery. 15. Wash the night before surgery.  The night before surgery wash the foot and leg well with water and the antibacterial soap provided. Be sure to pay special attention to beneath the toenails and in between the toes.  Wash for at least three (3) minutes. Rinse thoroughly with water and dry well with a towel.  Perform this wash unless told not to do so by your physician.  Enclosed: 1 Ice pack (please put in freezer the night before surgery)   1 Hibiclens skin cleaner     Pre-op instructions  If you have any questions regarding the instructions, please do not hesitate to call our office.  [place]: [address], [place], [place] [address] -- [phone]  [place]: [address] [place], [place] [address] -- [phone]  [place]: [address]  , [place] [address] -- [phone]  [place]: [address], [address], [place], [place] [address] -- [phone]  Website:  [email or web address] 

## 2018-08-10 ENCOUNTER — Telehealth: Payer: Self-pay | Admitting: *Deleted

## 2018-08-10 NOTE — Telephone Encounter (Signed)
"  I'm calling to schedule my surgery."  Who is your doctor?  "Dr. Marylene LandStover is my doctor."  Have you signed your consent forms?  "Yes, I have."  Dr. Marylene LandStover does surgeries on Mondays.  Do you have a date in mind?  "I'd like to do it the week after next."  Dr. Marylene LandStover is not doing surgery on December 30.  Her next available date is August 30, 2018.  "Go ahead and put me down for that date.  What time will I need to be there?"  Someone from the surgical center will give you a call the Friday before.  They will give you your arrival time.  All you need to do is register with the surgical center via [employer], instructions are in the brochure that we gave you.  "Okay, thank you."  "I just spoke to you about scheduling my surgery.  I cannot do it on the sixth.  I forgot I have to go on a trip that week.  Can I do it the following week?"  Yes, I will get it rescheduled to January 13.  "Thank you."

## 2018-08-11 DIAGNOSIS — M79606 Pain in leg, unspecified: Secondary | ICD-10-CM | POA: Diagnosis not present

## 2018-08-11 DIAGNOSIS — G894 Chronic pain syndrome: Secondary | ICD-10-CM | POA: Diagnosis not present

## 2018-08-11 DIAGNOSIS — M542 Cervicalgia: Secondary | ICD-10-CM | POA: Diagnosis not present

## 2018-08-11 DIAGNOSIS — M961 Postlaminectomy syndrome, not elsewhere classified: Secondary | ICD-10-CM | POA: Diagnosis not present

## 2018-08-11 DIAGNOSIS — Z79899 Other long term (current) drug therapy: Secondary | ICD-10-CM | POA: Diagnosis not present

## 2018-08-11 DIAGNOSIS — Z79891 Long term (current) use of opiate analgesic: Secondary | ICD-10-CM | POA: Diagnosis not present

## 2018-09-06 ENCOUNTER — Other Ambulatory Visit: Payer: Self-pay | Admitting: Sports Medicine

## 2018-09-06 ENCOUNTER — Encounter: Payer: Self-pay | Admitting: Sports Medicine

## 2018-09-06 DIAGNOSIS — M79671 Pain in right foot: Secondary | ICD-10-CM | POA: Diagnosis not present

## 2018-09-06 DIAGNOSIS — M84871 Other disorders of continuity of bone, right ankle and foot: Secondary | ICD-10-CM | POA: Diagnosis not present

## 2018-09-06 DIAGNOSIS — G8918 Other acute postprocedural pain: Secondary | ICD-10-CM

## 2018-09-06 DIAGNOSIS — K5903 Drug induced constipation: Secondary | ICD-10-CM

## 2018-09-06 DIAGNOSIS — M21611 Bunion of right foot: Secondary | ICD-10-CM | POA: Diagnosis not present

## 2018-09-06 DIAGNOSIS — M2011 Hallux valgus (acquired), right foot: Secondary | ICD-10-CM | POA: Diagnosis not present

## 2018-09-06 DIAGNOSIS — M7741 Metatarsalgia, right foot: Secondary | ICD-10-CM | POA: Diagnosis not present

## 2018-09-06 DIAGNOSIS — M25571 Pain in right ankle and joints of right foot: Secondary | ICD-10-CM | POA: Diagnosis not present

## 2018-09-06 DIAGNOSIS — M21541 Acquired clubfoot, right foot: Secondary | ICD-10-CM | POA: Diagnosis not present

## 2018-09-06 DIAGNOSIS — E7800 Pure hypercholesterolemia, unspecified: Secondary | ICD-10-CM | POA: Diagnosis not present

## 2018-09-06 DIAGNOSIS — R110 Nausea: Secondary | ICD-10-CM

## 2018-09-06 DIAGNOSIS — M205X1 Other deformities of toe(s) (acquired), right foot: Secondary | ICD-10-CM | POA: Diagnosis not present

## 2018-09-06 MED ORDER — PROMETHAZINE HCL 25 MG PO TABS: 25.0000 mg | ORAL_TABLET | Freq: Three times a day (TID) | ORAL | 0 refills | Status: DC | PRN

## 2018-09-06 MED ORDER — DOCUSATE SODIUM 100 MG PO CAPS: 100.0000 mg | ORAL_CAPSULE | Freq: Two times a day (BID) | ORAL | 0 refills | Status: DC

## 2018-09-06 MED ORDER — IBUPROFEN 800 MG PO TABS: 800.0000 mg | ORAL_TABLET | Freq: Three times a day (TID) | ORAL | 0 refills | Status: DC | PRN

## 2018-09-06 MED ORDER — OXYCODONE-ACETAMINOPHEN 10-325 MG PO TABS: 1.0000 | ORAL_TABLET | Freq: Four times a day (QID) | ORAL | 0 refills | Status: AC | PRN

## 2018-09-06 NOTE — Progress Notes (Signed)
Post op meds entered. Percocet 10/325mg  take I tab Q6h PRN disp 20 was written by hand and given to patient because my imprivata app was not working -Dr. Marylene Land

## 2018-09-06 NOTE — Addendum Note (Signed)
Addended by: Asencion Islam T on: 09/06/2018 09:41 AM   Modules accepted: Orders

## 2018-09-07 ENCOUNTER — Telehealth: Payer: Self-pay | Admitting: Sports Medicine

## 2018-09-07 NOTE — Telephone Encounter (Signed)
Post op check phone call made to patient. Patient reports that she is doing ok. Patient has questions about icing. I re-instructed patient to ice behind the knee 20 mins of each hour today and then on tomorrow can reduce frequency to 3x per day. Patient expressed understanding and I asks about the boot; I advised patient that she can remove boot when sitting however when walking/standing/or moving around the boot must be on to protect the foot. Patient thanked me for my call. I reminded her to call office if she has any other ?s or concerns and that I will follow up with her as scheduled at next appointment. -Dr. Marylene Land

## 2018-09-13 ENCOUNTER — Ambulatory Visit (INDEPENDENT_AMBULATORY_CARE_PROVIDER_SITE_OTHER): Payer: Medicare Other

## 2018-09-13 ENCOUNTER — Encounter: Payer: Self-pay | Admitting: Podiatry

## 2018-09-13 ENCOUNTER — Other Ambulatory Visit: Payer: Self-pay

## 2018-09-13 ENCOUNTER — Ambulatory Visit (INDEPENDENT_AMBULATORY_CARE_PROVIDER_SITE_OTHER): Payer: Medicare Other | Admitting: Podiatry

## 2018-09-13 VITALS — BP 91/52 | HR 67 | Temp 98.1°F | Resp 16

## 2018-09-13 DIAGNOSIS — M216X9 Other acquired deformities of unspecified foot: Secondary | ICD-10-CM

## 2018-09-13 DIAGNOSIS — M79671 Pain in right foot: Secondary | ICD-10-CM

## 2018-09-13 DIAGNOSIS — G8918 Other acute postprocedural pain: Secondary | ICD-10-CM

## 2018-09-13 DIAGNOSIS — M2011 Hallux valgus (acquired), right foot: Secondary | ICD-10-CM

## 2018-09-13 MED ORDER — CLINDAMYCIN HCL 150 MG PO CAPS: 150.0000 mg | ORAL_CAPSULE | Freq: Two times a day (BID) | ORAL | 0 refills | Status: DC

## 2018-09-13 NOTE — Progress Notes (Signed)
Subjective:  Patient ID: Sonya Allen, female    DOB: [birth year],  MRN: [id number]  Chief Complaint  Patient presents with  . Routine Post Op    POV #1 Pt. states," it's doing okay, it hurts a little; 3/10 throbbing intermittent pain." Tx: percocet, icing and elevation -pt deneis N/V/F?Ch   DOS: 09/06/17 Procedure:  L Akin Osteotomy, Sesamoidectomy, 2nd Metatarsal Osteotomy Cannon Kettle)   63 y.o. female returns for post-op check. Doing fine hurts a little. No post-op issues or concerns.  Review of Systems: Negative except as noted in the HPI. Denies N/V/F/Ch.  Past Medical History:  Diagnosis Date  . Arthritis   . Bilateral dry eyes    Takes Systane Drops  . Bronchitis    hx of  . Constipation due to pain medication   . Edema of extremities    Takes Lasix  . Fibromyalgia   . History of kidney stones   . Hypercholesteremia   . PONV (postoperative nausea and vomiting)   . Restless leg syndrome    Takes Klonopin    Current Outpatient Medications:  .  betamethasone acetate-betamethasone sodium phosphate (CELESTONE) 6 (3-3) MG/ML injection, Inject 12 mg into the articular space., Disp: , Rfl:  .  bupivacaine, PF, (MARCAINE) 0.25 % SOLN injection, Inject 25 mg into the skin., Disp: , Rfl:  .  carisoprodol (SOMA) 350 MG tablet, Take 350 mg by mouth 3 (three) times daily., Disp: , Rfl:  .  carisoprodol (SOMA) 350 MG tablet, , Disp: , Rfl:  .  clindamycin (CLEOCIN) 150 MG capsule, Take 1 capsule (150 mg total) by mouth 2 (two) times daily., Disp: 14 capsule, Rfl: 0 .  clonazePAM (KLONOPIN) 0.5 MG tablet, Take 0.5 mg by mouth at bedtime. Restless legs, Disp: , Rfl:  .  clonazePAM (KLONOPIN) 0.5 MG tablet, , Disp: , Rfl:  .  conjugated estrogens (PREMARIN) vaginal cream, , Disp: , Rfl:  .  diclofenac sodium (VOLTAREN) 1 % GEL, , Disp: , Rfl:  .  docusate sodium (COLACE) 100 MG capsule, Take 1 capsule (100 mg total) by mouth 2 (two) times daily., Disp: 10 capsule, Rfl: 0 .  enoxaparin  (LOVENOX) 40 MG/0.4ML injection, Inject 0.4 mLs (40 mg total) into the skin daily., Disp: 12 Syringe, Rfl: 0 .  furosemide (LASIX) 20 MG tablet, Take 20 mg by mouth daily., Disp: , Rfl:  .  furosemide (LASIX) 20 MG tablet, , Disp: , Rfl:  .  GRALISE 600 MG TABS, , Disp: , Rfl:  .  HYDROmorphone HCl (EXALGO) 12 MG T24A SR tablet, , Disp: , Rfl:  .  Hylan 48 MG/6ML SOSY, Inject 48 mg into the articular space., Disp: , Rfl:  .  ibuprofen (ADVIL,MOTRIN) 800 MG tablet, Take 1 tablet (800 mg total) by mouth every 8 (eight) hours as needed., Disp: 30 tablet, Rfl: 0 .  lamoTRIgine (LAMICTAL) 100 MG tablet, , Disp: , Rfl:  .  Lifitegrast (XIIDRA) 5 % SOLN, , Disp: , Rfl:  .  LINZESS 290 MCG CAPS capsule, , Disp: , Rfl:  .  methocarbamol (ROBAXIN) 500 MG tablet, Take 1-2 tablets (500-1,000 mg total) by mouth every 6 (six) hours as needed for muscle spasms., Disp: 60 tablet, Rfl: 2 .  morphine (MS CONTIN) 30 MG 12 hr tablet, Take by mouth., Disp: , Rfl:  .  morphine (MS CONTIN) 60 MG 12 hr tablet, Take 60 mg by mouth every 12 (twelve) hours., Disp: , Rfl:  .  MOVANTIK 12.5 MG TABS, ,  Disp: , Rfl:  .  NUVIGIL 150 MG tablet, , Disp: , Rfl:  .  ondansetron (ZOFRAN) 4 MG tablet, , Disp: , Rfl:  .  ondansetron (ZOFRAN) 4 MG tablet, , Disp: , Rfl:  .  oxycodone (ROXICODONE) 30 MG immediate release tablet, Take 1 tablet (30 mg total) by mouth every 8 (eight) hours as needed (for breakthrough pain)., Disp: 100 tablet, Rfl: 0 .  oxyCODONE-acetaminophen (PERCOCET) 10-325 MG tablet, Take 1 tablet by mouth every 6 (six) hours as needed for up to 7 days for pain., Disp: 20 tablet, Rfl: 0 .  Polyethyl Glycol-Propyl Glycol (SYSTANE OP), Apply 1 drop to eye 2 (two) times daily as needed (dry eyes)., Disp: , Rfl:  .  pramipexole (MIRAPEX) 0.125 MG tablet, , Disp: , Rfl:  .  pravastatin (PRAVACHOL) 40 MG tablet, Take 40 mg by mouth daily., Disp: , Rfl:  .  pravastatin (PRAVACHOL) 40 MG tablet, Take by mouth., Disp: , Rfl:    .  PredniSONE 10 MG KIT, Take as instructed for 12 days and a tapering dose, Disp: 48 each, Rfl: 0 .  pregabalin (LYRICA) 100 MG capsule, Take by mouth daily., Disp: , Rfl: 0 .  pregabalin (LYRICA) 150 MG capsule, Take 150 mg by mouth daily., Disp: , Rfl:  .  promethazine (PHENERGAN) 25 MG tablet, Take 1 tablet (25 mg total) by mouth every 8 (eight) hours as needed for nausea or vomiting., Disp: 20 tablet, Rfl: 0 .  Suvorexant (BELSOMRA) 15 MG TABS, , Disp: , Rfl:  .  tiZANidine (ZANAFLEX) 4 MG tablet, , Disp: , Rfl:  .  topiramate (TOPAMAX) 100 MG tablet, Take 100 mg by mouth 2 (two) times daily., Disp: , Rfl:  .  topiramate (TOPAMAX) 50 MG tablet, , Disp: , Rfl:  .  venlafaxine XR (EFFEXOR-XR) 150 MG 24 hr capsule, Take 150 mg by mouth daily., Disp: , Rfl:  .  venlafaxine XR (EFFEXOR-XR) 150 MG 24 hr capsule, , Disp: , Rfl:  .  Vitamin D, Ergocalciferol, (DRISDOL) 50000 units CAPS capsule, , Disp: , Rfl:  .  ZETIA 10 MG tablet, , Disp: , Rfl:   Current Facility-Administered Medications:  .  triamcinolone acetonide (KENALOG) 10 MG/ML injection 10 mg, 10 mg, Other, Once, Stover, Titorya, DPM .  triamcinolone acetonide (KENALOG) 10 MG/ML injection 10 mg, 10 mg, Other, Once, Stover, Titorya, DPM .  triamcinolone acetonide (KENALOG) 10 MG/ML injection 10 mg, 10 mg, Other, Once, Landis Martins, DPM  Social History   Tobacco Use  Smoking Status Current Some Day Smoker  . Packs/day: 0.50  . Years: 29.00  . Pack years: 14.50  . Types: Cigarettes  Smokeless Tobacco Never Used    Allergies  Allergen Reactions  . Pravastatin Sodium Other (See Comments)    Muscles  aches Muscles  aches   . Augmentin [Amoxicillin-Pot Clavulanate] Diarrhea   Objective:   Vitals:   09/13/18 1345  BP: (!) 91/52  Pulse: 67  Resp: 16  Temp: 98.1 F (36.7 C)   There is no height or weight on file to calculate BMI. Constitutional Well developed. Well nourished.  Vascular Foot warm and well  perfused. Capillary refill normal to all digits.   Neurologic Normal speech. Oriented to person, place, and time. Epicritic sensation to light touch grossly present bilaterally.  Dermatologic Skin healing well. Slight redness distal aspect of the hallux. Incisions coapted but steri-strip loss at the 1st metatarsal incision.     Orthopedic: Tenderness to palpation noted about  the surgical site.   Radiographs: Taken and reviewed c/w post-op state interval sesamoidectomy both hallucal sesamoids, shortening of 2nd metatarsal with screw fixation, osteotomy of phalanx of the hallux with intact staple fixation. Assessment:   1. Post-op pain   2. Right foot pain   3. Prominent metatarsal head, unspecified laterality   4. Hallux interphalangeus, acquired, right    Plan:  Patient was evaluated and treated and all questions answered.  S/p foot surgery right -Progressing as expected post-operatively. -XR: As above -WB Status: WBAT in cam boot -Sutures: steris reapplied. -Medications: Rx clindamycin for slight redness of distal aspect of the incision. PCN allergic. -Foot redressed.  Return in about 1 week (around 09/20/2018) for Post-op with Dr. Cannon Kettle.

## 2018-09-14 DIAGNOSIS — M1710 Unilateral primary osteoarthritis, unspecified knee: Secondary | ICD-10-CM | POA: Diagnosis not present

## 2018-09-14 DIAGNOSIS — M542 Cervicalgia: Secondary | ICD-10-CM | POA: Diagnosis not present

## 2018-09-14 DIAGNOSIS — M961 Postlaminectomy syndrome, not elsewhere classified: Secondary | ICD-10-CM | POA: Diagnosis not present

## 2018-09-14 DIAGNOSIS — G894 Chronic pain syndrome: Secondary | ICD-10-CM | POA: Diagnosis not present

## 2018-09-22 ENCOUNTER — Ambulatory Visit (INDEPENDENT_AMBULATORY_CARE_PROVIDER_SITE_OTHER): Payer: Medicare Other | Admitting: Sports Medicine

## 2018-09-22 ENCOUNTER — Encounter: Payer: Self-pay | Admitting: Sports Medicine

## 2018-09-22 VITALS — BP 119/75 | HR 63 | Temp 98.2°F | Resp 16

## 2018-09-22 DIAGNOSIS — M79671 Pain in right foot: Secondary | ICD-10-CM

## 2018-09-22 DIAGNOSIS — Z9889 Other specified postprocedural states: Secondary | ICD-10-CM

## 2018-09-22 DIAGNOSIS — M2011 Hallux valgus (acquired), right foot: Secondary | ICD-10-CM

## 2018-09-22 DIAGNOSIS — M216X9 Other acquired deformities of unspecified foot: Secondary | ICD-10-CM

## 2018-09-22 DIAGNOSIS — G8918 Other acute postprocedural pain: Secondary | ICD-10-CM

## 2018-09-22 NOTE — Progress Notes (Signed)
Subjective: Sonya Allen is a 63 y.o. female patient seen today in office for POV #2(DOS 09/06/2018), S/P right Aiken osteotomy, sesamoidectomy, and second metatarsal osteotomy. Patient admits a little pain at surgical site that throbs from time to time 4 out of 10, denies calf pain, denies headache, chest pain, shortness of breath, nausea, vomiting, fever, or chills. Patient states that she has completed her clindamycin antibiotic and is taken ibuprofen as needed and using her cam boot and icing elevating however does admit that she has been on her foot more than recommended.  Patient denies any other acute symptoms at this time.  Patient Active Problem List   Diagnosis Date Noted  . Recurrent right knee instability 12/31/2017  . Acute pain of left shoulder 08/13/2017  . 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 Knee pain, right 01/23/2014  . 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  . Unilateral primary osteoarthritis, right knee 05/26/2013  . Other specified postprocedural states 01/31/2013    Current Outpatient Medications on File Prior to Visit  Medication Sig Dispense Refill  . betamethasone acetate-betamethasone sodium phosphate (CELESTONE) 6 (3-3) MG/ML injection Inject 12 mg into the articular space.    . bupivacaine, PF, (MARCAINE) 0.25 % SOLN injection Inject 25 mg into the skin.    . carisoprodol (SOMA) 350 MG tablet Take 350 mg by mouth 3 (three) times daily.    . carisoprodol (SOMA) 350 MG tablet     . clindamycin (CLEOCIN) 150 MG capsule Take 1 capsule (150 mg total) by mouth 2 (two) times daily. 14 capsule 0  . clonazePAM (KLONOPIN) 0.5 MG tablet Take 0.5 mg by mouth at bedtime. Restless legs    . clonazePAM (KLONOPIN) 0.5 MG tablet     . conjugated estrogens (PREMARIN) vaginal cream     . diclofenac sodium (VOLTAREN) 1 % GEL     . docusate sodium (COLACE) 100 MG capsule Take 1 capsule (100 mg total) by mouth 2 (two) times daily. 10 capsule 0  . enoxaparin (LOVENOX) 40  MG/0.4ML injection Inject 0.4 mLs (40 mg total) into the skin daily. 12 Syringe 0  . furosemide (LASIX) 20 MG tablet Take 20 mg by mouth daily.    . furosemide (LASIX) 20 MG tablet     . GRALISE 600 MG TABS     . HYDROmorphone HCl (EXALGO) 12 MG T24A SR tablet     . Hylan 48 MG/6ML SOSY Inject 48 mg into the articular space.    Marland Kitchen ibuprofen (ADVIL,MOTRIN) 800 MG tablet Take 1 tablet (800 mg total) by mouth every 8 (eight) hours as needed. 30 tablet 0  . lamoTRIgine (LAMICTAL) 100 MG tablet     . Lifitegrast (XIIDRA) 5 % SOLN     . LINZESS 290 MCG CAPS capsule     . methocarbamol (ROBAXIN) 500 MG tablet Take 1-2 tablets (500-1,000 mg total) by mouth every 6 (six) hours as needed for muscle spasms. 60 tablet 2  . morphine (MS CONTIN) 30 MG 12 hr tablet Take by mouth.    . morphine (MS CONTIN) 60 MG 12 hr tablet Take 60 mg by mouth every 12 (twelve) hours.    Marland Kitchen MOVANTIK 12.5 MG TABS     . NUVIGIL 150 MG tablet     . ondansetron (ZOFRAN) 4 MG tablet     . ondansetron (ZOFRAN) 4 MG tablet     . oxycodone (ROXICODONE) 30 MG immediate release tablet Take 1 tablet (30 mg total) by mouth every 8 (eight)  hours as needed (for breakthrough pain). 100 tablet 0  . Polyethyl Glycol-Propyl Glycol (SYSTANE OP) Apply 1 drop to eye 2 (two) times daily as needed (dry eyes).    . pramipexole (MIRAPEX) 0.125 MG tablet     . pravastatin (PRAVACHOL) 40 MG tablet Take 40 mg by mouth daily.    . pravastatin (PRAVACHOL) 40 MG tablet Take by mouth.    . PredniSONE 10 MG KIT Take as instructed for 12 days and a tapering dose 48 each 0  . pregabalin (LYRICA) 100 MG capsule Take by mouth daily.  0  . pregabalin (LYRICA) 150 MG capsule Take 150 mg by mouth daily.    . promethazine (PHENERGAN) 25 MG tablet Take 1 tablet (25 mg total) by mouth every 8 (eight) hours as needed for nausea or vomiting. 20 tablet 0  . Suvorexant (BELSOMRA) 15 MG TABS     . tiZANidine (ZANAFLEX) 4 MG tablet     . topiramate (TOPAMAX) 100 MG  tablet Take 100 mg by mouth 2 (two) times daily.    Marland Kitchen topiramate (TOPAMAX) 50 MG tablet     . venlafaxine XR (EFFEXOR-XR) 150 MG 24 hr capsule Take 150 mg by mouth daily.    Marland Kitchen venlafaxine XR (EFFEXOR-XR) 150 MG 24 hr capsule     . Vitamin D, Ergocalciferol, (DRISDOL) 50000 units CAPS capsule     . ZETIA 10 MG tablet      Current Facility-Administered Medications on File Prior to Visit  Medication Dose Route Frequency Provider Last Rate Last Dose  . triamcinolone acetonide (KENALOG) 10 MG/ML injection 10 mg  10 mg Other Once Landis Martins, DPM      . triamcinolone acetonide (KENALOG) 10 MG/ML injection 10 mg  10 mg Other Once Landis Martins, DPM      . triamcinolone acetonide (KENALOG) 10 MG/ML injection 10 mg  10 mg Other Once Landis Martins, DPM        Allergies  Allergen Reactions  . Pravastatin Sodium Other (See Comments)    Muscles  aches Muscles  aches   . Augmentin [Amoxicillin-Pot Clavulanate] Diarrhea    Objective: There were no vitals filed for this visit.  General: No acute distress, AAOx3  Right foot: Sutures intact with no gapping or dehiscence at surgical site, mild swelling and bruising to right forefoot, resolved erythema, no warmth, no drainage, no signs of infection noted, Capillary fill time <3 seconds in all digits, gross sensation present via light touch to right foot.  Mild guarding with range of motion right foot.  No pain with calf compression.   Assessment and Plan:  Problem List Items Addressed This Visit    None    Visit Diagnoses    S/P foot surgery, right    -  Primary   Post-op pain       Right foot pain       Prominent metatarsal head, unspecified laterality       Hallux interphalangeus, acquired, right           -Patient seen and evaluated -Advised patient to remain compliant with limited weightbearing because this can cause complications if she continues to spend more time than recommended on her foot -Applied dry sterile dressing to  surgical site right/left foot secured with ACE wrap and stockinet  -Advised patient to make sure to keep dressings clean, dry, and intact to right surgical site, removing the ACE as needed  -Advised patient to continue with cam boot and to limit ambulation to  necessity -Advised patient to ice and elevate as necessary  -Continue with Motrin as needed -Will plan for suture removal and transitioning patient to postoperative shoe at next office visit. In the meantime, patient to call office if any issues or problems arise.   Landis Martins, DPM

## 2018-09-29 DIAGNOSIS — F5101 Primary insomnia: Secondary | ICD-10-CM | POA: Diagnosis not present

## 2018-09-30 ENCOUNTER — Ambulatory Visit (INDEPENDENT_AMBULATORY_CARE_PROVIDER_SITE_OTHER): Payer: Medicare Other | Admitting: Sports Medicine

## 2018-09-30 ENCOUNTER — Encounter: Payer: Self-pay | Admitting: Sports Medicine

## 2018-09-30 VITALS — BP 91/69 | HR 70 | Temp 98.0°F | Resp 16

## 2018-09-30 DIAGNOSIS — G8918 Other acute postprocedural pain: Secondary | ICD-10-CM

## 2018-09-30 DIAGNOSIS — M216X9 Other acquired deformities of unspecified foot: Secondary | ICD-10-CM

## 2018-09-30 DIAGNOSIS — M79671 Pain in right foot: Secondary | ICD-10-CM

## 2018-09-30 DIAGNOSIS — M2011 Hallux valgus (acquired), right foot: Secondary | ICD-10-CM

## 2018-09-30 DIAGNOSIS — Z9889 Other specified postprocedural states: Secondary | ICD-10-CM | POA: Diagnosis not present

## 2018-09-30 NOTE — Progress Notes (Signed)
Subjective: Sonya Allen is a 63 y.o. female patient seen today in office for POV #3(DOS 09/06/2018), S/P right Aiken osteotomy, sesamoidectomy, and second metatarsal osteotomy. Patient admits ocassional pain now 2 out of 10 and feels like she is doing okay, denies calf pain, denies headache, chest pain, shortness of breath, nausea, vomiting, fever, or chills.  Denies any issues with cam boot.  Taking ibuprofen icing and elevating as needed. Patient denies any other acute symptoms at this time.  Patient Active Problem List   Diagnosis Date Noted  . Recurrent right knee instability 12/31/2017  . Acute pain of left shoulder 08/13/2017  . 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 Knee pain, right 01/23/2014  . 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  . Unilateral primary osteoarthritis, right knee 05/26/2013  . Other specified postprocedural states 01/31/2013    Current Outpatient Medications on File Prior to Visit  Medication Sig Dispense Refill  . betamethasone acetate-betamethasone sodium phosphate (CELESTONE) 6 (3-3) MG/ML injection Inject 12 mg into the articular space.    . bupivacaine, PF, (MARCAINE) 0.25 % SOLN injection Inject 25 mg into the skin.    . carisoprodol (SOMA) 350 MG tablet Take 350 mg by mouth 3 (three) times daily.    . carisoprodol (SOMA) 350 MG tablet     . clindamycin (CLEOCIN) 150 MG capsule Take 1 capsule (150 mg total) by mouth 2 (two) times daily. 14 capsule 0  . clonazePAM (KLONOPIN) 0.5 MG tablet Take 0.5 mg by mouth at bedtime. Restless legs    . clonazePAM (KLONOPIN) 0.5 MG tablet     . conjugated estrogens (PREMARIN) vaginal cream     . diclofenac sodium (VOLTAREN) 1 % GEL     . docusate sodium (COLACE) 100 MG capsule Take 1 capsule (100 mg total) by mouth 2 (two) times daily. 10 capsule 0  . enoxaparin (LOVENOX) 40 MG/0.4ML injection Inject 0.4 mLs (40 mg total) into the skin daily. 12 Syringe 0  . furosemide (LASIX) 20 MG tablet Take 20 mg by mouth  daily.    . furosemide (LASIX) 20 MG tablet     . GRALISE 600 MG TABS     . HYDROmorphone HCl (EXALGO) 12 MG T24A SR tablet     . Hylan 48 MG/6ML SOSY Inject 48 mg into the articular space.    Marland Kitchen ibuprofen (ADVIL,MOTRIN) 800 MG tablet Take 1 tablet (800 mg total) by mouth every 8 (eight) hours as needed. 30 tablet 0  . lamoTRIgine (LAMICTAL) 100 MG tablet     . Lifitegrast (XIIDRA) 5 % SOLN     . LINZESS 290 MCG CAPS capsule     . methocarbamol (ROBAXIN) 500 MG tablet Take 1-2 tablets (500-1,000 mg total) by mouth every 6 (six) hours as needed for muscle spasms. 60 tablet 2  . morphine (MS CONTIN) 30 MG 12 hr tablet Take by mouth.    . morphine (MS CONTIN) 60 MG 12 hr tablet Take 60 mg by mouth every 12 (twelve) hours.    Marland Kitchen MOVANTIK 12.5 MG TABS     . NUVIGIL 150 MG tablet     . ondansetron (ZOFRAN) 4 MG tablet     . ondansetron (ZOFRAN) 4 MG tablet     . oxycodone (ROXICODONE) 30 MG immediate release tablet Take 1 tablet (30 mg total) by mouth every 8 (eight) hours as needed (for breakthrough pain). 100 tablet 0  . Polyethyl Glycol-Propyl Glycol (SYSTANE OP) Apply 1 drop to eye 2 (two) times  daily as needed (dry eyes).    . pramipexole (MIRAPEX) 0.125 MG tablet     . pravastatin (PRAVACHOL) 40 MG tablet Take 40 mg by mouth daily.    . pravastatin (PRAVACHOL) 40 MG tablet Take by mouth.    . PredniSONE 10 MG KIT Take as instructed for 12 days and a tapering dose 48 each 0  . pregabalin (LYRICA) 100 MG capsule Take by mouth daily.  0  . pregabalin (LYRICA) 150 MG capsule Take 150 mg by mouth daily.    . promethazine (PHENERGAN) 25 MG tablet Take 1 tablet (25 mg total) by mouth every 8 (eight) hours as needed for nausea or vomiting. 20 tablet 0  . Suvorexant (BELSOMRA) 15 MG TABS     . tiZANidine (ZANAFLEX) 4 MG tablet     . topiramate (TOPAMAX) 100 MG tablet Take 100 mg by mouth 2 (two) times daily.    Marland Kitchen topiramate (TOPAMAX) 50 MG tablet     . venlafaxine XR (EFFEXOR-XR) 150 MG 24 hr  capsule Take 150 mg by mouth daily.    Marland Kitchen venlafaxine XR (EFFEXOR-XR) 150 MG 24 hr capsule     . Vitamin D, Ergocalciferol, (DRISDOL) 50000 units CAPS capsule     . ZETIA 10 MG tablet      Current Facility-Administered Medications on File Prior to Visit  Medication Dose Route Frequency Provider Last Rate Last Dose  . triamcinolone acetonide (KENALOG) 10 MG/ML injection 10 mg  10 mg Other Once Landis Martins, DPM      . triamcinolone acetonide (KENALOG) 10 MG/ML injection 10 mg  10 mg Other Once Landis Martins, DPM      . triamcinolone acetonide (KENALOG) 10 MG/ML injection 10 mg  10 mg Other Once Landis Martins, DPM        Allergies  Allergen Reactions  . Pravastatin Sodium Other (See Comments)    Muscles  aches Muscles  aches   . Augmentin [Amoxicillin-Pot Clavulanate] Diarrhea    Objective: There were no vitals filed for this visit.  General: No acute distress, AAOx3  Right foot: Sutures intact with no gapping or dehiscence at surgical site, mild swelling and bruising to right forefoot, resolved erythema, no warmth, no drainage, no signs of infection noted, Capillary fill time <3 seconds in all digits, gross sensation present via light touch to right foot.  Mild guarding with range of motion right foot.  No pain with calf compression.   Assessment and Plan:  Problem List Items Addressed This Visit    None    Visit Diagnoses    S/P foot surgery, right    -  Primary   Post-op pain       Right foot pain       Prominent metatarsal head, unspecified laterality       Hallux interphalangeus, acquired, right           -Patient seen and evaluated -Sutures removed may shower tomorrow and redress with ACE wrap for swelling/edema control -Dispensed postop shoe for patient to use and advised patient to limit ambulation to necessity -Advised patient to ice and elevate as necessary  -Continue with Motrin as needed -Will plan for xray and transitioning patient out of postop shoe at  next office visit. In the meantime, patient to call office if any issues or problems arise.   Landis Martins, DPM

## 2018-10-12 DIAGNOSIS — M961 Postlaminectomy syndrome, not elsewhere classified: Secondary | ICD-10-CM | POA: Diagnosis not present

## 2018-10-12 DIAGNOSIS — G894 Chronic pain syndrome: Secondary | ICD-10-CM | POA: Diagnosis not present

## 2018-10-12 DIAGNOSIS — M79606 Pain in leg, unspecified: Secondary | ICD-10-CM | POA: Diagnosis not present

## 2018-10-12 DIAGNOSIS — M545 Low back pain: Secondary | ICD-10-CM | POA: Diagnosis not present

## 2018-10-14 ENCOUNTER — Encounter: Payer: Self-pay | Admitting: Sports Medicine

## 2018-10-14 ENCOUNTER — Ambulatory Visit (INDEPENDENT_AMBULATORY_CARE_PROVIDER_SITE_OTHER): Payer: Medicare Other | Admitting: Sports Medicine

## 2018-10-14 ENCOUNTER — Ambulatory Visit (INDEPENDENT_AMBULATORY_CARE_PROVIDER_SITE_OTHER): Payer: Medicare Other

## 2018-10-14 VITALS — BP 114/65 | HR 85 | Temp 98.2°F | Resp 16

## 2018-10-14 DIAGNOSIS — Z9889 Other specified postprocedural states: Secondary | ICD-10-CM

## 2018-10-14 DIAGNOSIS — M2011 Hallux valgus (acquired), right foot: Secondary | ICD-10-CM | POA: Diagnosis not present

## 2018-10-14 DIAGNOSIS — M79671 Pain in right foot: Secondary | ICD-10-CM

## 2018-10-14 DIAGNOSIS — M216X9 Other acquired deformities of unspecified foot: Secondary | ICD-10-CM

## 2018-10-14 DIAGNOSIS — G8918 Other acute postprocedural pain: Secondary | ICD-10-CM

## 2018-10-14 NOTE — Progress Notes (Signed)
Subjective: Sonya Allen is a 63 y.o. female patient seen today in office for POV #4(DOS 09/06/2018), S/P right Aiken osteotomy, sesamoidectomy, and second metatarsal osteotomy. Patient admits an episode of pain where she had to use padding with her postop shoe that is 2 out of 10 but otherwise feels like she is doing okay, denies calf pain, denies headache, chest pain, shortness of breath, nausea, vomiting, fever, or chills. Patient denies any other acute symptoms at this time.  Patient Active Problem List   Diagnosis Date Noted  . Recurrent right knee instability 12/31/2017  . Acute pain of left shoulder 08/13/2017  . 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 Knee pain, right 01/23/2014  . 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  . Unilateral primary osteoarthritis, right knee 05/26/2013  . Other specified postprocedural states 01/31/2013    Current Outpatient Medications on File Prior to Visit  Medication Sig Dispense Refill  . betamethasone acetate-betamethasone sodium phosphate (CELESTONE) 6 (3-3) MG/ML injection Inject 12 mg into the articular space.    . bupivacaine, PF, (MARCAINE) 0.25 % SOLN injection Inject 25 mg into the skin.    . carisoprodol (SOMA) 350 MG tablet Take 350 mg by mouth 3 (three) times daily.    . carisoprodol (SOMA) 350 MG tablet     . clindamycin (CLEOCIN) 150 MG capsule Take 1 capsule (150 mg total) by mouth 2 (two) times daily. 14 capsule 0  . clonazePAM (KLONOPIN) 0.5 MG tablet Take 0.5 mg by mouth at bedtime. Restless legs    . clonazePAM (KLONOPIN) 0.5 MG tablet     . conjugated estrogens (PREMARIN) vaginal cream     . diclofenac sodium (VOLTAREN) 1 % GEL     . docusate sodium (COLACE) 100 MG capsule Take 1 capsule (100 mg total) by mouth 2 (two) times daily. 10 capsule 0  . enoxaparin (LOVENOX) 40 MG/0.4ML injection Inject 0.4 mLs (40 mg total) into the skin daily. 12 Syringe 0  . furosemide (LASIX) 20 MG tablet Take 20 mg by mouth daily.    .  furosemide (LASIX) 20 MG tablet     . GRALISE 600 MG TABS     . HYDROmorphone HCl (EXALGO) 12 MG T24A SR tablet     . Hylan 48 MG/6ML SOSY Inject 48 mg into the articular space.    Marland Kitchen ibuprofen (ADVIL,MOTRIN) 800 MG tablet Take 1 tablet (800 mg total) by mouth every 8 (eight) hours as needed. 30 tablet 0  . lamoTRIgine (LAMICTAL) 100 MG tablet     . Lifitegrast (XIIDRA) 5 % SOLN     . LINZESS 290 MCG CAPS capsule     . methocarbamol (ROBAXIN) 500 MG tablet Take 1-2 tablets (500-1,000 mg total) by mouth every 6 (six) hours as needed for muscle spasms. 60 tablet 2  . morphine (MS CONTIN) 30 MG 12 hr tablet Take by mouth.    . morphine (MS CONTIN) 60 MG 12 hr tablet Take 60 mg by mouth every 12 (twelve) hours.    Marland Kitchen MOVANTIK 12.5 MG TABS     . NUVIGIL 150 MG tablet     . ondansetron (ZOFRAN) 4 MG tablet     . ondansetron (ZOFRAN) 4 MG tablet     . oxycodone (ROXICODONE) 30 MG immediate release tablet Take 1 tablet (30 mg total) by mouth every 8 (eight) hours as needed (for breakthrough pain). 100 tablet 0  . Polyethyl Glycol-Propyl Glycol (SYSTANE OP) Apply 1 drop to eye 2 (two) times daily  as needed (dry eyes).    . pramipexole (MIRAPEX) 0.125 MG tablet     . pravastatin (PRAVACHOL) 40 MG tablet Take 40 mg by mouth daily.    . pravastatin (PRAVACHOL) 40 MG tablet Take by mouth.    . PredniSONE 10 MG KIT Take as instructed for 12 days and a tapering dose 48 each 0  . pregabalin (LYRICA) 100 MG capsule Take by mouth daily.  0  . pregabalin (LYRICA) 150 MG capsule Take 150 mg by mouth daily.    . promethazine (PHENERGAN) 25 MG tablet Take 1 tablet (25 mg total) by mouth every 8 (eight) hours as needed for nausea or vomiting. 20 tablet 0  . Suvorexant (BELSOMRA) 15 MG TABS     . tiZANidine (ZANAFLEX) 4 MG tablet     . topiramate (TOPAMAX) 100 MG tablet Take 100 mg by mouth 2 (two) times daily.    Marland Kitchen topiramate (TOPAMAX) 50 MG tablet     . venlafaxine XR (EFFEXOR-XR) 150 MG 24 hr capsule Take 150  mg by mouth daily.    Marland Kitchen venlafaxine XR (EFFEXOR-XR) 150 MG 24 hr capsule     . Vitamin D, Ergocalciferol, (DRISDOL) 50000 units CAPS capsule     . ZETIA 10 MG tablet      Current Facility-Administered Medications on File Prior to Visit  Medication Dose Route Frequency Provider Last Rate Last Dose  . triamcinolone acetonide (KENALOG) 10 MG/ML injection 10 mg  10 mg Other Once Landis Martins, DPM      . triamcinolone acetonide (KENALOG) 10 MG/ML injection 10 mg  10 mg Other Once Landis Martins, DPM      . triamcinolone acetonide (KENALOG) 10 MG/ML injection 10 mg  10 mg Other Once Landis Martins, DPM        Allergies  Allergen Reactions  . Pravastatin Sodium Other (See Comments)    Muscles  aches Muscles  aches   . Augmentin [Amoxicillin-Pot Clavulanate] Diarrhea    Objective: There were no vitals filed for this visit.  General: No acute distress, AAOx3  Right foot: Incision sites well-healed at surgical sites, mild swelling and bruising to right forefoot, resolved erythema, no warmth, no drainage, no signs of infection noted, Capillary fill time <3 seconds in all digits, gross sensation present via light touch to right foot.  Mild guarding with range of motion right foot.  Mild elevation of hallux, no pain with calf compression.   Assessment and Plan:  Problem List Items Addressed This Visit    None    Visit Diagnoses    S/P foot surgery, right    -  Primary   Relevant Orders   DG Foot Complete Right   Post-op pain       Right foot pain       Prominent metatarsal head, unspecified laterality       Hallux interphalangeus, acquired, right           -Patient seen and evaluated -X-rays reviewed consistent with postoperative status -Darco toe alignment splint dispensed to help stretch toes to prevent tendon from over pulling when standing that is causing elevation of first toe -Recommend to slowly transition to using tennis shoes and to rest ice elevate as needed and may  continue with Motrin as needed -Will plan for postop check at next office visit. In the meantime, patient to call office if any issues or problems arise.   Landis Martins, DPM

## 2018-10-18 ENCOUNTER — Other Ambulatory Visit: Payer: Self-pay | Admitting: Sports Medicine

## 2018-10-18 DIAGNOSIS — M2011 Hallux valgus (acquired), right foot: Secondary | ICD-10-CM

## 2018-10-18 DIAGNOSIS — Z9889 Other specified postprocedural states: Secondary | ICD-10-CM

## 2018-11-09 DIAGNOSIS — M961 Postlaminectomy syndrome, not elsewhere classified: Secondary | ICD-10-CM | POA: Diagnosis not present

## 2018-11-09 DIAGNOSIS — M79606 Pain in leg, unspecified: Secondary | ICD-10-CM | POA: Diagnosis not present

## 2018-11-09 DIAGNOSIS — M545 Low back pain: Secondary | ICD-10-CM | POA: Diagnosis not present

## 2018-11-09 DIAGNOSIS — G894 Chronic pain syndrome: Secondary | ICD-10-CM | POA: Diagnosis not present

## 2018-11-12 ENCOUNTER — Ambulatory Visit (INDEPENDENT_AMBULATORY_CARE_PROVIDER_SITE_OTHER): Payer: Medicare Other | Admitting: Sports Medicine

## 2018-11-12 ENCOUNTER — Other Ambulatory Visit: Payer: Self-pay

## 2018-11-12 ENCOUNTER — Encounter: Payer: Self-pay | Admitting: Sports Medicine

## 2018-11-12 VITALS — BP 91/57 | HR 72 | Temp 97.0°F | Resp 16

## 2018-11-12 DIAGNOSIS — M216X9 Other acquired deformities of unspecified foot: Secondary | ICD-10-CM

## 2018-11-12 DIAGNOSIS — G8918 Other acute postprocedural pain: Secondary | ICD-10-CM

## 2018-11-12 DIAGNOSIS — M6240 Contracture of muscle, unspecified site: Secondary | ICD-10-CM

## 2018-11-12 DIAGNOSIS — M2031 Hallux varus (acquired), right foot: Secondary | ICD-10-CM

## 2018-11-12 DIAGNOSIS — Z9889 Other specified postprocedural states: Secondary | ICD-10-CM

## 2018-11-12 DIAGNOSIS — M79671 Pain in right foot: Secondary | ICD-10-CM

## 2018-11-12 NOTE — Progress Notes (Signed)
DATE OF PROCEDURE: 11-12-18  PREOPERATIVE DIAGNOSES: 1 Contracture of tendon 2. 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 Same  ANESTHESIA: 3cc's 1% lidocaine plain and  0.5% marcaine plain in a hallux block fashion.  HEMOSTASIS: Manuel pressure   ESTIMATED BLOOD LOSS: Less than 5 mL.  MATERIALS USED: 18 g needle and chisel blade  PROCEDURE PERFORMED: Extensor tenotomy right great toe  DESCRIPTION OF THE PROCEDURE: The patient was brought to the in office treatment room and placed in the  chair in the supine position.  Procedure was thoroughly explained to patient and the consent was obtained.  The right foot was then prepped using Betadine and a preoperative injection consisting of 3 cc of 1:1 mixture 1% lidocaine plain and 0.5% Marcaine plain was administered in a local field block fashion following anesthesia was then confirmed utilizing a 1 to pick up. Attention was then directed to the dorsal aspect of the right great toe where there was digital contracture noted of the extensor tendon with subsequent hammertoe deformity thus utilizing a 18-gauge needle at the dorsal aspect of the interphalangeal joint a small stab incision was made with the needle and the tendon was slowly released and a small chisel fashion using the sharp point of the 18-gauge needle while applying plantar pressure to the great toe to weakening and releasing the tendon and scar of the extensor hallucis longus.  Once sufficient release was noted then the area was then thoroughly cleansed and Betadine was applied to the surgical wound and dressed with Steri-Strips, copious amounts of fluff and Kling, stockinette, and Coban bandage.  Patient was advised to wear loose fitting tennis shoe or return to postoperative shoe.  Patient tolerated the procedure well.  There were no complications.  The patient was then held in postanesthesia waiting area with vital signs stable and the vascular  status at appropriate levels. The patient was given specific instructions and education on how to continue caring for her wound to apply betadine and steristips to splint the toe.The patient was eventually discharged from office and advised to follow up with Dr. Marylene Land in one to two week's time for her first postoperative appointment.   Asencion Islam, DPM

## 2018-11-26 ENCOUNTER — Other Ambulatory Visit: Payer: Self-pay

## 2018-11-26 ENCOUNTER — Ambulatory Visit (INDEPENDENT_AMBULATORY_CARE_PROVIDER_SITE_OTHER): Payer: Medicare Other | Admitting: Sports Medicine

## 2018-11-26 ENCOUNTER — Encounter: Payer: Self-pay | Admitting: Sports Medicine

## 2018-11-26 VITALS — BP 101/61 | HR 69 | Temp 98.8°F | Resp 16

## 2018-11-26 DIAGNOSIS — Z9889 Other specified postprocedural states: Secondary | ICD-10-CM

## 2018-11-26 DIAGNOSIS — M6240 Contracture of muscle, unspecified site: Secondary | ICD-10-CM

## 2018-11-26 DIAGNOSIS — M216X9 Other acquired deformities of unspecified foot: Secondary | ICD-10-CM

## 2018-11-26 DIAGNOSIS — G8918 Other acute postprocedural pain: Secondary | ICD-10-CM

## 2018-11-26 DIAGNOSIS — M2031 Hallux varus (acquired), right foot: Secondary | ICD-10-CM

## 2018-11-26 NOTE — Progress Notes (Signed)
Subjective: Sonya Allen is a 63 y.o. female patient seen today in office for POV #1 (DOS 11-12-18, S/P tenotomy performed in office of the extensor tendon.  Patient is also status post right Aiken osteotomy, sesamoidectomy, and second metatarsal osteotomy performed January of this year. Patient admit to 4 out of 10 pain underneath her big toe joint states that it hurts that she cannot wear her chart shoes and that her great toe is still sticking up, denies calf pain, denies headache, chest pain, shortness of breath, nausea, vomiting, fever, or chills. Patient denies any other acute symptoms at this time.  Patient Active Problem List   Diagnosis Date Noted  . Recurrent right knee instability 12/31/2017  . Acute pain of left shoulder 08/13/2017  . 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 Knee pain, right 01/23/2014  . 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  . Unilateral primary osteoarthritis, right knee 05/26/2013  . Other specified postprocedural states 01/31/2013    Current Outpatient Medications on File Prior to Visit  Medication Sig Dispense Refill  . betamethasone acetate-betamethasone sodium phosphate (CELESTONE) 6 (3-3) MG/ML injection Inject 12 mg into the articular space.    . bupivacaine, PF, (MARCAINE) 0.25 % SOLN injection Inject 25 mg into the skin.    . carisoprodol (SOMA) 350 MG tablet Take 350 mg by mouth 3 (three) times daily.    . carisoprodol (SOMA) 350 MG tablet     . clindamycin (CLEOCIN) 150 MG capsule Take 1 capsule (150 mg total) by mouth 2 (two) times daily. 14 capsule 0  . clonazePAM (KLONOPIN) 0.5 MG tablet Take 0.5 mg by mouth at bedtime. Restless legs    . clonazePAM (KLONOPIN) 0.5 MG tablet     . conjugated estrogens (PREMARIN) vaginal cream     . diclofenac sodium (VOLTAREN) 1 % GEL     . docusate sodium (COLACE) 100 MG capsule Take 1 capsule (100 mg total) by mouth 2 (two) times daily. 10 capsule 0  . enoxaparin (LOVENOX) 40 MG/0.4ML injection Inject  0.4 mLs (40 mg total) into the skin daily. 12 Syringe 0  . escitalopram (LEXAPRO) 10 MG tablet     . furosemide (LASIX) 20 MG tablet Take 20 mg by mouth daily.    . furosemide (LASIX) 20 MG tablet     . GRALISE 600 MG TABS     . HYDROmorphone HCl (EXALGO) 12 MG T24A SR tablet     . Hylan 48 MG/6ML SOSY Inject 48 mg into the articular space.    Marland Kitchen ibuprofen (ADVIL,MOTRIN) 800 MG tablet Take 1 tablet (800 mg total) by mouth every 8 (eight) hours as needed. 30 tablet 0  . lamoTRIgine (LAMICTAL) 100 MG tablet     . Lifitegrast (XIIDRA) 5 % SOLN     . LINZESS 290 MCG CAPS capsule     . methocarbamol (ROBAXIN) 500 MG tablet Take 1-2 tablets (500-1,000 mg total) by mouth every 6 (six) hours as needed for muscle spasms. 60 tablet 2  . morphine (MS CONTIN) 30 MG 12 hr tablet Take by mouth.    . morphine (MS CONTIN) 60 MG 12 hr tablet Take 60 mg by mouth every 12 (twelve) hours.    Marland Kitchen MOVANTIK 12.5 MG TABS     . NUVIGIL 150 MG tablet     . ondansetron (ZOFRAN) 4 MG tablet     . ondansetron (ZOFRAN) 4 MG tablet     . oxycodone (ROXICODONE) 30 MG immediate release tablet Take 1 tablet (  30 mg total) by mouth every 8 (eight) hours as needed (for breakthrough pain). 100 tablet 0  . oxyCODONE-acetaminophen (PERCOCET) 10-325 MG tablet     . Polyethyl Glycol-Propyl Glycol (SYSTANE OP) Apply 1 drop to eye 2 (two) times daily as needed (dry eyes).    . pramipexole (MIRAPEX) 0.125 MG tablet     . pravastatin (PRAVACHOL) 40 MG tablet Take 40 mg by mouth daily.    . pravastatin (PRAVACHOL) 40 MG tablet Take by mouth.    . PredniSONE 10 MG KIT Take as instructed for 12 days and a tapering dose 48 each 0  . pregabalin (LYRICA) 100 MG capsule Take by mouth daily.  0  . pregabalin (LYRICA) 150 MG capsule Take 150 mg by mouth daily.    . promethazine (PHENERGAN) 25 MG tablet Take 1 tablet (25 mg total) by mouth every 8 (eight) hours as needed for nausea or vomiting. 20 tablet 0  . Suvorexant (BELSOMRA) 15 MG TABS      . tiZANidine (ZANAFLEX) 4 MG tablet     . topiramate (TOPAMAX) 100 MG tablet Take 100 mg by mouth 2 (two) times daily.    Marland Kitchen topiramate (TOPAMAX) 50 MG tablet     . venlafaxine XR (EFFEXOR-XR) 150 MG 24 hr capsule Take 150 mg by mouth daily.    Marland Kitchen venlafaxine XR (EFFEXOR-XR) 150 MG 24 hr capsule     . Vitamin D, Ergocalciferol, (DRISDOL) 50000 units CAPS capsule     . ZETIA 10 MG tablet      Current Facility-Administered Medications on File Prior to Visit  Medication Dose Route Frequency Provider Last Rate Last Dose  . triamcinolone acetonide (KENALOG) 10 MG/ML injection 10 mg  10 mg Other Once Landis Martins, DPM      . triamcinolone acetonide (KENALOG) 10 MG/ML injection 10 mg  10 mg Other Once Landis Martins, DPM      . triamcinolone acetonide (KENALOG) 10 MG/ML injection 10 mg  10 mg Other Once Landis Martins, DPM        Allergies  Allergen Reactions  . Pravastatin Sodium Other (See Comments)    Muscles  aches Muscles  aches   . Augmentin [Amoxicillin-Pot Clavulanate] Diarrhea    Objective: There were no vitals filed for this visit.  General: No acute distress, AAOx3  Right foot: Tenotomy site well-healed, no erythema, no warmth, no drainage, no signs of infection noted, Capillary fill time <3 seconds in all digits, gross sensation present via light touch to right foot.  Mild pain with palpation plantar aspect of first metatarsophalangeal joint with elevation of hallux supportive of hallux malleus, no pain with calf compression.   Assessment and Plan:  Problem List Items Addressed This Visit    None    Visit Diagnoses    Contracture of tendon sheath    -  Primary   Hallux malleus of right foot       S/P foot surgery, right       Post-op pain       Prominent metatarsal head, unspecified laterality           -Patient seen and evaluated -Previous x-rays reviewed with patient and discussed with her because she did not make any improvements with the extensor tenotomy  likely her restrictions in motion are secondary to scar tissue versus bony impingement -Advised patient to consider strongly joint implant surgery versus first ray fusion; provided patient with educational information regarding option of implant -Advised patient meanwhile to continue with  Darco toe alignment splint dispensed to help stretch toes -Recommend for now to continue with good supportive shoes like tennis shoes and to avoid dress shoes or walking barefoot or hard surfaces that could cause ongoing pain on the right foot -Will plan for possible surgery consult at next visit for implant if the COVID-19 virus will allow [place] to move forward with any type of planning of elective procedures at next office visit. In the meantime, patient to call office if any issues or problems arise.   Landis Martins, DPM

## 2018-12-13 DIAGNOSIS — M542 Cervicalgia: Secondary | ICD-10-CM | POA: Diagnosis not present

## 2018-12-13 DIAGNOSIS — Z981 Arthrodesis status: Secondary | ICD-10-CM | POA: Diagnosis not present

## 2018-12-13 DIAGNOSIS — G894 Chronic pain syndrome: Secondary | ICD-10-CM | POA: Diagnosis not present

## 2018-12-13 DIAGNOSIS — M961 Postlaminectomy syndrome, not elsewhere classified: Secondary | ICD-10-CM | POA: Diagnosis not present

## 2018-12-22 DIAGNOSIS — R200 Anesthesia of skin: Secondary | ICD-10-CM | POA: Diagnosis not present

## 2018-12-22 DIAGNOSIS — G894 Chronic pain syndrome: Secondary | ICD-10-CM | POA: Diagnosis not present

## 2018-12-22 DIAGNOSIS — M542 Cervicalgia: Secondary | ICD-10-CM | POA: Diagnosis not present

## 2018-12-22 DIAGNOSIS — Z981 Arthrodesis status: Secondary | ICD-10-CM | POA: Diagnosis not present

## 2018-12-31 ENCOUNTER — Encounter: Payer: Medicare Other | Admitting: Sports Medicine

## 2019-01-05 ENCOUNTER — Encounter: Payer: Self-pay | Admitting: Sports Medicine

## 2019-01-05 ENCOUNTER — Other Ambulatory Visit: Payer: Self-pay

## 2019-01-05 ENCOUNTER — Ambulatory Visit (INDEPENDENT_AMBULATORY_CARE_PROVIDER_SITE_OTHER): Payer: Medicare Other | Admitting: Sports Medicine

## 2019-01-05 VITALS — Temp 97.4°F | Resp 16

## 2019-01-05 DIAGNOSIS — L570 Actinic keratosis: Secondary | ICD-10-CM

## 2019-01-05 DIAGNOSIS — M216X9 Other acquired deformities of unspecified foot: Secondary | ICD-10-CM

## 2019-01-05 DIAGNOSIS — M6240 Contracture of muscle, unspecified site: Secondary | ICD-10-CM

## 2019-01-05 DIAGNOSIS — M2031 Hallux varus (acquired), right foot: Secondary | ICD-10-CM

## 2019-01-05 DIAGNOSIS — M2021 Hallux rigidus, right foot: Secondary | ICD-10-CM

## 2019-01-05 DIAGNOSIS — M79671 Pain in right foot: Secondary | ICD-10-CM

## 2019-01-05 DIAGNOSIS — Z9889 Other specified postprocedural states: Secondary | ICD-10-CM

## 2019-01-05 NOTE — Patient Instructions (Signed)
Pre-Operative Instructions  Congratulations, you have decided to take an important step towards improving your quality of life.  You can be assured that the doctors and staff at [facility] will be with you every step of the way.  Here are some important things you should know:  1. Plan to be at the surgery center/hospital at least 1 (one) hour prior to your scheduled time, unless otherwise directed by the surgical center/hospital staff.  You must have a responsible adult accompany you, remain during the surgery and drive you home.  Make sure you have directions to the surgical center/hospital to ensure you arrive on time. 2. If you are having surgery at [facility] or [facility] hospitals, you will need a copy of your medical history and physical form from your family physician within one month prior to the date of surgery. We will give you a form for your primary physician to complete.  3. We make every effort to accommodate the date you request for surgery.  However, there are times where surgery dates or times have to be moved.  We will contact you as soon as possible if a change in schedule is required.   4. No aspirin/ibuprofen for one week before surgery.  If you are on aspirin, any non-steroidal anti-inflammatory medications (Mobic, Aleve, Ibuprofen) should not be taken seven (7) days prior to your surgery.  You make take Tylenol for pain prior to surgery.  5. Medications - If you are taking daily heart and blood pressure medications, seizure, reflux, allergy, asthma, anxiety, pain or diabetes medications, make sure you notify the surgery center/hospital before the day of surgery so they can tell you which medications you should take or avoid the day of surgery. 6. No food or drink after midnight the night before surgery unless directed otherwise by surgical center/hospital staff. 7. No alcoholic beverages 24-hours prior to surgery.  No smoking 24-hours prior or 24-hours after  surgery. 8. Wear loose pants or shorts. They should be loose enough to fit over bandages, boots, and casts. 9. Don't wear slip-on shoes. Sneakers are preferred. 10. Bring your boot with you to the surgery center/hospital.  Also bring crutches or a walker if your physician has prescribed it for you.  If you do not have this equipment, it will be provided for you after surgery. 11. If you have not been contacted by the surgery center/hospital by the day before your surgery, call to confirm the date and time of your surgery. 12. Leave-time from work may vary depending on the type of surgery you have.  Appropriate arrangements should be made prior to surgery with your employer. 13. Prescriptions will be provided immediately following surgery by your doctor.  Fill these as soon as possible after surgery and take the medication as directed. Pain medications will not be refilled on weekends and must be approved by the doctor. 14. Remove nail polish on the operative foot and avoid getting pedicures prior to surgery. 15. Wash the night before surgery.  The night before surgery wash the foot and leg well with water and the antibacterial soap provided. Be sure to pay special attention to beneath the toenails and in between the toes.  Wash for at least three (3) minutes. Rinse thoroughly with water and dry well with a towel.  Perform this wash unless told not to do so by your physician.  Enclosed: 1 Ice pack (please put in freezer the night before surgery)   1 Hibiclens skin cleaner     Pre-op instructions  If you have any questions regarding the instructions, please do not hesitate to call our office.  [place]: [address], [place], [place] [address] -- [phone]  [place]: [address] [place], [place] [address] -- [phone]  [place]: [address]  [place], [place] [address] -- [phone]  [place]: [address], [address], [place], [place] [address] -- [phone]  Website:  [email or web address] 

## 2019-01-05 NOTE — Progress Notes (Addendum)
Subjective: Sonya Allen is a 63 y.o. female patient seen today in office for POV #2 DOS 11-12-18, S/P tenotomy performed in office of the extensor tendon.  Patient is also status post right Aiken osteotomy, sesamoidectomy, and second metatarsal osteotomy performed January of this year. Patient admit to 4 out of 10 pain underneath her big toe joint states that it hurts she is noticing more callusing underneath the big toe joint states that her toes still very stiff and that the toe splint makes it hurt has tried good supportive shoes and padding but the toe still continues to stick up and cause her a lot of pain underneath her metatarsal head.  Patient denies nausea vomiting fever chills night sweats or any other constitutional symptoms at this time.  Patient Active Problem List   Diagnosis Date Noted  . Recurrent right knee instability 12/31/2017  . Acute pain of left shoulder 08/13/2017  . 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 Knee pain, right 01/23/2014  . 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  . Unilateral primary osteoarthritis, right knee 05/26/2013  . Other specified postprocedural states 01/31/2013    Current Outpatient Medications on File Prior to Visit  Medication Sig Dispense Refill  . betamethasone acetate-betamethasone sodium phosphate (CELESTONE) 6 (3-3) MG/ML injection Inject 12 mg into the articular space.    . bupivacaine, PF, (MARCAINE) 0.25 % SOLN injection Inject 25 mg into the skin.    . carisoprodol (SOMA) 350 MG tablet Take 350 mg by mouth 3 (three) times daily.    . carisoprodol (SOMA) 350 MG tablet     . clindamycin (CLEOCIN) 150 MG capsule Take 1 capsule (150 mg total) by mouth 2 (two) times daily. 14 capsule 0  . clonazePAM (KLONOPIN) 0.5 MG tablet Take 0.5 mg by mouth at bedtime. Restless legs    . clonazePAM (KLONOPIN) 0.5 MG tablet     . conjugated estrogens (PREMARIN) vaginal cream     . diclofenac (VOLTAREN) 50 MG EC tablet     . diclofenac sodium  (VOLTAREN) 1 % GEL     . docusate sodium (COLACE) 100 MG capsule Take 1 capsule (100 mg total) by mouth 2 (two) times daily. 10 capsule 0  . enoxaparin (LOVENOX) 40 MG/0.4ML injection Inject 0.4 mLs (40 mg total) into the skin daily. 12 Syringe 0  . escitalopram (LEXAPRO) 10 MG tablet     . furosemide (LASIX) 20 MG tablet Take 20 mg by mouth daily.    . furosemide (LASIX) 20 MG tablet     . GRALISE 600 MG TABS     . HYDROmorphone HCl (EXALGO) 12 MG T24A SR tablet     . Hylan 48 MG/6ML SOSY Inject 48 mg into the articular space.    Marland Kitchen ibuprofen (ADVIL,MOTRIN) 800 MG tablet Take 1 tablet (800 mg total) by mouth every 8 (eight) hours as needed. 30 tablet 0  . lamoTRIgine (LAMICTAL) 100 MG tablet     . Lifitegrast (XIIDRA) 5 % SOLN     . LINZESS 290 MCG CAPS capsule     . methocarbamol (ROBAXIN) 500 MG tablet Take 1-2 tablets (500-1,000 mg total) by mouth every 6 (six) hours as needed for muscle spasms. 60 tablet 2  . morphine (MS CONTIN) 30 MG 12 hr tablet Take by mouth.    . morphine (MS CONTIN) 60 MG 12 hr tablet Take 60 mg by mouth every 12 (twelve) hours.    Marland Kitchen MOVANTIK 12.5 MG TABS     .  NUVIGIL 150 MG tablet     . ondansetron (ZOFRAN) 4 MG tablet     . ondansetron (ZOFRAN) 4 MG tablet     . oxycodone (ROXICODONE) 30 MG immediate release tablet Take 1 tablet (30 mg total) by mouth every 8 (eight) hours as needed (for breakthrough pain). 100 tablet 0  . oxyCODONE-acetaminophen (PERCOCET) 10-325 MG tablet     . Polyethyl Glycol-Propyl Glycol (SYSTANE OP) Apply 1 drop to eye 2 (two) times daily as needed (dry eyes).    . pramipexole (MIRAPEX) 0.125 MG tablet     . pravastatin (PRAVACHOL) 40 MG tablet Take 40 mg by mouth daily.    . pravastatin (PRAVACHOL) 40 MG tablet Take by mouth.    . PredniSONE 10 MG KIT Take as instructed for 12 days and a tapering dose 48 each 0  . pregabalin (LYRICA) 100 MG capsule Take by mouth daily.  0  . pregabalin (LYRICA) 150 MG capsule Take 150 mg by mouth  daily.    . promethazine (PHENERGAN) 25 MG tablet Take 1 tablet (25 mg total) by mouth every 8 (eight) hours as needed for nausea or vomiting. 20 tablet 0  . Suvorexant (BELSOMRA) 15 MG TABS     . tiZANidine (ZANAFLEX) 4 MG tablet     . topiramate (TOPAMAX) 100 MG tablet Take 100 mg by mouth 2 (two) times daily.    Marland Kitchen topiramate (TOPAMAX) 50 MG tablet     . venlafaxine XR (EFFEXOR-XR) 150 MG 24 hr capsule Take 150 mg by mouth daily.    Marland Kitchen venlafaxine XR (EFFEXOR-XR) 150 MG 24 hr capsule     . Vitamin D, Ergocalciferol, (DRISDOL) 50000 units CAPS capsule     . ZETIA 10 MG tablet      Current Facility-Administered Medications on File Prior to Visit  Medication Dose Route Frequency Provider Last Rate Last Dose  . triamcinolone acetonide (KENALOG) 10 MG/ML injection 10 mg  10 mg Other Once Landis Martins, DPM      . triamcinolone acetonide (KENALOG) 10 MG/ML injection 10 mg  10 mg Other Once Landis Martins, DPM      . triamcinolone acetonide (KENALOG) 10 MG/ML injection 10 mg  10 mg Other Once Landis Martins, DPM        Allergies  Allergen Reactions  . Pravastatin Sodium Other (See Comments)    Muscles  aches Muscles  aches   . Augmentin [Amoxicillin-Pot Clavulanate] Diarrhea    Objective: There were no vitals filed for this visit.  General: No acute distress, AAOx3  Right foot: Tenotomy site well-healed, no erythema, no warmth, no drainage, no signs of infection noted, callus sub-met 1 on right, capillary fill time <3 seconds in all digits, gross sensation present via light touch to right foot.  Mild pain with palpation plantar aspect of first metatarsophalangeal joint with elevation of hallux supportive of hallux malleus and frozen 1st MTPJ, no pain with calf compression.   Assessment and Plan:  Problem List Items Addressed This Visit    None    Visit Diagnoses    Hallux malleus of right foot    -  Primary   Contracture of tendon sheath       Prominent metatarsal head,  unspecified laterality       Keratosis       S/P foot surgery, right       Right foot pain       Hallux rigidus of right foot         -  Patient seen and evaluated -Previous x-rays reviewed again with patient and discussed with her because she did not make any improvements with the extensor tenotomy likely her restrictions in motion are secondary to scar tissue versus bony impingement that it will be a good idea for patient to undergo joint implant surgery to see if we can improve the position of the first toe -Patient opt for surgical management. Consent obtained for right first metatarsophalangeal joint implant surgery.  Pre and Post op course explained. Risks, benefits, alternatives explained. No guarantees given or implied. Surgical booking slip submitted and provided patient with Surgical packet and info for [facility]. -Patient already has a cam boot which she will use postop and may weight-bear immediately to prevent stiffness at the first metatarsophalangeal joint after implant surgery -Recommend for now to continue with good supportive shoes like tennis shoes and to avoid dress shoes or walking barefoot or hard surfaces that could cause ongoing pain on the right foot may discontinue toe splint since it makes her pain worse right now will resume toe splint after her stitches are removed to continue to stretch the toe for flexibility and positioning purposes -Patient to return to office after surgery or sooner if problems or issues arise.  Patient reports that she would like to consider having surgery in June and will contact our surgery scheduler to arrange.  Landis Martins, DPM

## 2019-01-12 DIAGNOSIS — M1710 Unilateral primary osteoarthritis, unspecified knee: Secondary | ICD-10-CM | POA: Diagnosis not present

## 2019-01-12 DIAGNOSIS — M961 Postlaminectomy syndrome, not elsewhere classified: Secondary | ICD-10-CM | POA: Diagnosis not present

## 2019-01-12 DIAGNOSIS — Z4542 Encounter for adjustment and management of neuropacemaker (brain) (peripheral nerve) (spinal cord): Secondary | ICD-10-CM | POA: Diagnosis not present

## 2019-01-12 DIAGNOSIS — G894 Chronic pain syndrome: Secondary | ICD-10-CM | POA: Diagnosis not present

## 2019-01-12 DIAGNOSIS — M542 Cervicalgia: Secondary | ICD-10-CM | POA: Diagnosis not present

## 2019-01-18 DIAGNOSIS — E785 Hyperlipidemia, unspecified: Secondary | ICD-10-CM | POA: Diagnosis not present

## 2019-01-18 DIAGNOSIS — F5101 Primary insomnia: Secondary | ICD-10-CM | POA: Diagnosis not present

## 2019-01-18 DIAGNOSIS — Z79899 Other long term (current) drug therapy: Secondary | ICD-10-CM | POA: Diagnosis not present

## 2019-01-19 ENCOUNTER — Telehealth: Payer: Self-pay | Admitting: Sports Medicine

## 2019-01-19 NOTE — Telephone Encounter (Signed)
Pt called to schedule surgery with Dr. Marylene Land. I asked if she had a specific date in mind and she requested Monday 06 July. I told her I would get her scheduled and someone from the surgical center would call 24-48 hours before to let her know what time to arrive.

## 2019-02-07 ENCOUNTER — Telehealth: Payer: Self-pay | Admitting: *Deleted

## 2019-02-07 NOTE — Telephone Encounter (Signed)
"  I'm calling to see if I'm scheduled for surgery with Dr. Cannon Kettle."  I see from a conversation with Kathryne Hitch, you decided on a date of February 28, 2019.  "So, I am scheduled for July 6?  The reason I'm asking is because I didn't see it as a confirmation when I registered with the surgical center."  Yes, you are scheduled for February 28, 2019.

## 2019-02-07 NOTE — Telephone Encounter (Signed)
"  This is Sonya Allen again.  Would you please call me back about my surgery?"  Patient called again at 4:20 pm.  I answered her question.

## 2019-02-10 DIAGNOSIS — G894 Chronic pain syndrome: Secondary | ICD-10-CM | POA: Diagnosis not present

## 2019-02-10 DIAGNOSIS — M79673 Pain in unspecified foot: Secondary | ICD-10-CM | POA: Diagnosis not present

## 2019-02-10 DIAGNOSIS — Z79899 Other long term (current) drug therapy: Secondary | ICD-10-CM | POA: Diagnosis not present

## 2019-02-10 DIAGNOSIS — M1710 Unilateral primary osteoarthritis, unspecified knee: Secondary | ICD-10-CM | POA: Diagnosis not present

## 2019-02-10 DIAGNOSIS — Z79891 Long term (current) use of opiate analgesic: Secondary | ICD-10-CM | POA: Diagnosis not present

## 2019-02-10 DIAGNOSIS — M961 Postlaminectomy syndrome, not elsewhere classified: Secondary | ICD-10-CM | POA: Diagnosis not present

## 2019-02-22 ENCOUNTER — Telehealth: Payer: Self-pay | Admitting: *Deleted

## 2019-02-22 NOTE — Telephone Encounter (Signed)
DOS 02/28/2019; 28291 - KELLER BUNION IMPLANT RIGHT FOOT  [employer]: Effective Date - 09/25/2018 - 08/25/2019  Deductible Member's plan does not have a deductible  Out-of-Pocket $538.91 MET YTD  $3,961.09   [facility] $0.00 Copayment for professional services.   Unless the provider has written an order to admit you as an inpatient to the hospital, you are an outpatient and pay the cost-sharing amounts for outpatient surgery.     This [employer]vant[employer] members plan does not currently require a prior authorization for these services. If you have general questions about the prior authorization requirements, please call us at [phone] or visit [email or web address] > Clinician Resources > Advance and Admission Notification Requirements. The number above acknowledges your notification. Please write this number down for future reference. Notification is not a guarantee of coverage or payment.  Decision ID #:[id number]   

## 2019-02-27 ENCOUNTER — Other Ambulatory Visit: Payer: Self-pay | Admitting: Sports Medicine

## 2019-02-27 DIAGNOSIS — G8918 Other acute postprocedural pain: Secondary | ICD-10-CM

## 2019-02-27 DIAGNOSIS — H1033 Unspecified acute conjunctivitis, bilateral: Secondary | ICD-10-CM | POA: Diagnosis not present

## 2019-02-27 NOTE — Progress Notes (Signed)
Post op pain meds entered -Dr. Collette Pescador  

## 2019-02-28 DIAGNOSIS — E7800 Pure hypercholesterolemia, unspecified: Secondary | ICD-10-CM | POA: Diagnosis not present

## 2019-02-28 DIAGNOSIS — M2021 Hallux rigidus, right foot: Secondary | ICD-10-CM | POA: Diagnosis not present

## 2019-02-28 DIAGNOSIS — M25571 Pain in right ankle and joints of right foot: Secondary | ICD-10-CM | POA: Diagnosis not present

## 2019-02-28 DIAGNOSIS — M2041 Other hammer toe(s) (acquired), right foot: Secondary | ICD-10-CM | POA: Diagnosis not present

## 2019-02-28 MED ORDER — IBUPROFEN 800 MG PO TABS: 800.0000 mg | ORAL_TABLET | Freq: Three times a day (TID) | ORAL | 0 refills | Status: DC | PRN

## 2019-02-28 MED ORDER — PROMETHAZINE HCL 25 MG PO TABS: 25.0000 mg | ORAL_TABLET | Freq: Three times a day (TID) | ORAL | 0 refills | Status: DC | PRN

## 2019-02-28 MED ORDER — OXYCODONE-ACETAMINOPHEN 10-325 MG PO TABS: 1.0000 | ORAL_TABLET | Freq: Three times a day (TID) | ORAL | 0 refills | Status: DC | PRN

## 2019-02-28 MED ORDER — DOCUSATE SODIUM 100 MG PO CAPS: 100.0000 mg | ORAL_CAPSULE | Freq: Two times a day (BID) | ORAL | 0 refills | Status: DC

## 2019-03-01 ENCOUNTER — Encounter: Payer: Self-pay | Admitting: Sports Medicine

## 2019-03-01 ENCOUNTER — Telehealth: Payer: Self-pay | Admitting: Sports Medicine

## 2019-03-01 NOTE — Telephone Encounter (Signed)
Post op check phone call made to patient. Patient's husband Ron answered and reports that wife is doing good and she is staying off her foot and keeping it elevated. Patient's husband reports that her leg is still numb and so far hasn't been painful. I advised husband to have her continue with rest, ice, elevation and PRN pain meds. I reminded him to call the office if there are any problems or concerns and to follow up as scheduled on next week. Patient's husband expressed thanks for the call and wished me a good day! -Dr. Cannon Kettle

## 2019-03-07 ENCOUNTER — Other Ambulatory Visit: Payer: Self-pay | Admitting: Sports Medicine

## 2019-03-07 ENCOUNTER — Telehealth: Payer: Self-pay | Admitting: *Deleted

## 2019-03-07 DIAGNOSIS — G8918 Other acute postprocedural pain: Secondary | ICD-10-CM

## 2019-03-07 MED ORDER — OXYCODONE-ACETAMINOPHEN 10-325 MG PO TABS: 1.0000 | ORAL_TABLET | Freq: Three times a day (TID) | ORAL | 0 refills | Status: AC | PRN

## 2019-03-07 NOTE — Telephone Encounter (Signed)
Notified patient refill has been sent

## 2019-03-07 NOTE — Telephone Encounter (Signed)
Patient requesting refill of pain meds

## 2019-03-07 NOTE — Telephone Encounter (Signed)
Sent it to [facility] Dr. Chauncey Cruel

## 2019-03-07 NOTE — Progress Notes (Signed)
Refilled Percocet. Sent to her pharmacy -Dr. Cannon Kettle

## 2019-03-09 ENCOUNTER — Encounter: Payer: Self-pay | Admitting: Sports Medicine

## 2019-03-09 ENCOUNTER — Ambulatory Visit (INDEPENDENT_AMBULATORY_CARE_PROVIDER_SITE_OTHER): Payer: Medicare Other | Admitting: Sports Medicine

## 2019-03-09 ENCOUNTER — Ambulatory Visit (INDEPENDENT_AMBULATORY_CARE_PROVIDER_SITE_OTHER): Payer: Medicare Other

## 2019-03-09 ENCOUNTER — Other Ambulatory Visit: Payer: Self-pay | Admitting: Sports Medicine

## 2019-03-09 ENCOUNTER — Other Ambulatory Visit: Payer: Self-pay

## 2019-03-09 VITALS — Temp 95.9°F | Resp 16

## 2019-03-09 DIAGNOSIS — M6240 Contracture of muscle, unspecified site: Secondary | ICD-10-CM

## 2019-03-09 DIAGNOSIS — M21619 Bunion of unspecified foot: Secondary | ICD-10-CM

## 2019-03-09 DIAGNOSIS — Z9889 Other specified postprocedural states: Secondary | ICD-10-CM

## 2019-03-09 DIAGNOSIS — M2031 Hallux varus (acquired), right foot: Secondary | ICD-10-CM

## 2019-03-09 DIAGNOSIS — M216X9 Other acquired deformities of unspecified foot: Secondary | ICD-10-CM

## 2019-03-09 DIAGNOSIS — M21611 Bunion of right foot: Secondary | ICD-10-CM

## 2019-03-09 DIAGNOSIS — M79671 Pain in right foot: Secondary | ICD-10-CM

## 2019-03-09 NOTE — Progress Notes (Signed)
Subjective: Sonya Allen is a 63 y.o. female patient seen today in office for POV #1 (DOS 02-28-19), S/P right Keller implant and IPJ fusion at the hallux with scar contracture release at the second MPJ.  Patient denies pain at surgical site currently sometimes at worst 3-4 out of 10, denies calf pain, denies headache, chest pain, shortness of breath, nausea, vomiting, fever, or chills. Patient states that she is doing good only taking ibuprofen or Advil for her foot pain and for her chronic pain taking Percocet. No other issues noted.   Patient Active Problem List   Diagnosis Date Noted  . Recurrent right knee instability 12/31/2017  . Acute pain of left shoulder 08/13/2017  . 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 Knee pain, right 01/23/2014  . 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  . Unilateral primary osteoarthritis, right knee 05/26/2013  . Other specified postprocedural states 01/31/2013    Current Outpatient Medications on File Prior to Visit  Medication Sig Dispense Refill  . betamethasone acetate-betamethasone sodium phosphate (CELESTONE) 6 (3-3) MG/ML injection Inject 12 mg into the articular space.    . bupivacaine, PF, (MARCAINE) 0.25 % SOLN injection Inject 25 mg into the skin.    . carisoprodol (SOMA) 350 MG tablet Take 350 mg by mouth 3 (three) times daily.    . carisoprodol (SOMA) 350 MG tablet     . clindamycin (CLEOCIN) 150 MG capsule Take 1 capsule (150 mg total) by mouth 2 (two) times daily. 14 capsule 0  . clonazePAM (KLONOPIN) 0.5 MG tablet Take 0.5 mg by mouth at bedtime. Restless legs    . clonazePAM (KLONOPIN) 0.5 MG tablet     . conjugated estrogens (PREMARIN) vaginal cream     . diclofenac (VOLTAREN) 50 MG EC tablet     . diclofenac sodium (VOLTAREN) 1 % GEL     . docusate sodium (COLACE) 100 MG capsule Take 1 capsule (100 mg total) by mouth 2 (two) times daily. 10 capsule 0  . enoxaparin (LOVENOX) 40 MG/0.4ML injection Inject 0.4 mLs (40 mg total) into  the skin daily. 12 Syringe 0  . escitalopram (LEXAPRO) 10 MG tablet     . furosemide (LASIX) 20 MG tablet Take 20 mg by mouth daily.    . furosemide (LASIX) 20 MG tablet     . GRALISE 600 MG TABS     . HYDROmorphone HCl (EXALGO) 12 MG T24A SR tablet     . Hylan 48 MG/6ML SOSY Inject 48 mg into the articular space.    Marland Kitchen ibuprofen (ADVIL) 800 MG tablet Take 1 tablet (800 mg total) by mouth every 8 (eight) hours as needed. 30 tablet 0  . lamoTRIgine (LAMICTAL) 100 MG tablet     . Lifitegrast (XIIDRA) 5 % SOLN     . LINZESS 290 MCG CAPS capsule     . methocarbamol (ROBAXIN) 500 MG tablet Take 1-2 tablets (500-1,000 mg total) by mouth every 6 (six) hours as needed for muscle spasms. 60 tablet 2  . morphine (MS CONTIN) 30 MG 12 hr tablet Take by mouth.    . morphine (MS CONTIN) 60 MG 12 hr tablet Take 60 mg by mouth every 12 (twelve) hours.    Marland Kitchen MOVANTIK 12.5 MG TABS     . NUVIGIL 150 MG tablet     . ondansetron (ZOFRAN) 4 MG tablet     . ondansetron (ZOFRAN) 4 MG tablet     . oxycodone (ROXICODONE) 30 MG immediate release tablet Take  1 tablet (30 mg total) by mouth every 8 (eight) hours as needed (for breakthrough pain). 100 tablet 0  . oxyCODONE-acetaminophen (PERCOCET) 10-325 MG tablet Take 1 tablet by mouth every 8 (eight) hours as needed for up to 7 days for pain. 21 tablet 0  . Polyethyl Glycol-Propyl Glycol (SYSTANE OP) Apply 1 drop to eye 2 (two) times daily as needed (dry eyes).    . pramipexole (MIRAPEX) 0.125 MG tablet     . pravastatin (PRAVACHOL) 40 MG tablet Take 40 mg by mouth daily.    . pravastatin (PRAVACHOL) 40 MG tablet Take by mouth.    . PredniSONE 10 MG KIT Take as instructed for 12 days and a tapering dose 48 each 0  . pregabalin (LYRICA) 100 MG capsule Take by mouth daily.  0  . pregabalin (LYRICA) 150 MG capsule Take 150 mg by mouth daily.    . promethazine (PHENERGAN) 25 MG tablet Take 1 tablet (25 mg total) by mouth every 8 (eight) hours as needed for nausea or  vomiting. 20 tablet 0  . Suvorexant (BELSOMRA) 15 MG TABS     . tiZANidine (ZANAFLEX) 4 MG tablet     . topiramate (TOPAMAX) 100 MG tablet Take 100 mg by mouth 2 (two) times daily.    Marland Kitchen topiramate (TOPAMAX) 50 MG tablet     . venlafaxine XR (EFFEXOR-XR) 150 MG 24 hr capsule Take 150 mg by mouth daily.    Marland Kitchen venlafaxine XR (EFFEXOR-XR) 150 MG 24 hr capsule     . Vitamin D, Ergocalciferol, (DRISDOL) 50000 units CAPS capsule     . ZETIA 10 MG tablet      Current Facility-Administered Medications on File Prior to Visit  Medication Dose Route Frequency Provider Last Rate Last Dose  . triamcinolone acetonide (KENALOG) 10 MG/ML injection 10 mg  10 mg Other Once Landis Martins, DPM      . triamcinolone acetonide (KENALOG) 10 MG/ML injection 10 mg  10 mg Other Once Landis Martins, DPM      . triamcinolone acetonide (KENALOG) 10 MG/ML injection 10 mg  10 mg Other Once Landis Martins, DPM        Allergies  Allergen Reactions  . Pravastatin Sodium Other (See Comments)    Muscles  aches Muscles  aches   . Augmentin [Amoxicillin-Pot Clavulanate] Diarrhea    Objective: There were no vitals filed for this visit.  General: No acute distress, AAOx3  Right foot: Sutures and K wire intact with no gapping or dehiscence at surgical site, mild swelling to right forefoot, no erythema, no warmth, no drainage, no signs of infection noted, Capillary fill time <3 seconds in all digits, gross sensation present via light touch to right foot. No pain or crepitation with range of motion right foot however guarded due to postop.  No pain with calf compression.   Post Op Xray, right foot: First ray and implant in good position. Osteotomy site healing. Hardware/K wire intact. Soft tissue swelling within normal limits for post op status.   Assessment and Plan:  Problem List Items Addressed This Visit    None    Visit Diagnoses    Hallux malleus of right foot    -  Primary   Contracture of tendon sheath        Prominent metatarsal head, unspecified laterality       S/P foot surgery, right       Right foot pain       Status post right foot surgery           -  Patient seen and evaluated -Applied dry sterile dressing to surgical site right foot secured with ACE wrap and stockinet  -Advised patient to make sure to keep dressings clean, dry, and intact to right surgical site, removing the ACE as needed  -Advised patient to continue with cam boot on right foot -Advised patient to limit activity to necessity  -Advised patient to ice and elevate as necessary  -Discussed with patient that we will defer back to her pain management doctor for them to treat her chronic back pain I will no longer give her any medication for any postoperative foot pain since she is doing well and is managing this pain with any additional ibuprofen or Aleve -Will plan for possible suture removal at next office visit. In the meantime, patient to call office if any issues or problems arise.   Landis Martins, DPM

## 2019-03-10 ENCOUNTER — Encounter: Payer: Medicare Other | Admitting: Sports Medicine

## 2019-03-16 ENCOUNTER — Other Ambulatory Visit: Payer: Self-pay

## 2019-03-16 ENCOUNTER — Ambulatory Visit (INDEPENDENT_AMBULATORY_CARE_PROVIDER_SITE_OTHER): Payer: Self-pay | Admitting: Sports Medicine

## 2019-03-16 ENCOUNTER — Encounter: Payer: Self-pay | Admitting: Sports Medicine

## 2019-03-16 VITALS — Temp 96.2°F | Resp 16

## 2019-03-16 DIAGNOSIS — M2031 Hallux varus (acquired), right foot: Secondary | ICD-10-CM

## 2019-03-16 DIAGNOSIS — M79671 Pain in right foot: Secondary | ICD-10-CM

## 2019-03-16 DIAGNOSIS — M216X9 Other acquired deformities of unspecified foot: Secondary | ICD-10-CM

## 2019-03-16 DIAGNOSIS — Z9889 Other specified postprocedural states: Secondary | ICD-10-CM

## 2019-03-16 DIAGNOSIS — M6240 Contracture of muscle, unspecified site: Secondary | ICD-10-CM

## 2019-03-16 NOTE — Progress Notes (Signed)
Subjective: Sonya Allen is a 63 y.o. female patient seen today in office for POV # 2 (DOS 02-28-19), S/P right Keller implant and IPJ fusion at the hallux with scar contracture release at the second MPJ.  Patient denies pain at surgical site currently states that the pain has been doing okay and that she has been walking more with no issues with her cam boot on but trying to be consistent with icing and elevating and taking her ibuprofen to keep the swelling down, patient denies headache, chest pain, shortness of breath, nausea, vomiting, fever, or chills. No other issues noted.   Patient Active Problem List   Diagnosis Date Noted  . Recurrent right knee instability 12/31/2017  . Acute pain of left shoulder 08/13/2017  . 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 Knee pain, right 01/23/2014  . 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  . Unilateral primary osteoarthritis, right knee 05/26/2013  . Other specified postprocedural states 01/31/2013    Current Outpatient Medications on File Prior to Visit  Medication Sig Dispense Refill  . betamethasone acetate-betamethasone sodium phosphate (CELESTONE) 6 (3-3) MG/ML injection Inject 12 mg into the articular space.    . bupivacaine, PF, (MARCAINE) 0.25 % SOLN injection Inject 25 mg into the skin.    . carisoprodol (SOMA) 350 MG tablet Take 350 mg by mouth 3 (three) times daily.    . carisoprodol (SOMA) 350 MG tablet     . clindamycin (CLEOCIN) 150 MG capsule Take 1 capsule (150 mg total) by mouth 2 (two) times daily. 14 capsule 0  . clonazePAM (KLONOPIN) 0.5 MG tablet Take 0.5 mg by mouth at bedtime. Restless legs    . clonazePAM (KLONOPIN) 0.5 MG tablet     . conjugated estrogens (PREMARIN) vaginal cream     . diclofenac (VOLTAREN) 50 MG EC tablet     . diclofenac sodium (VOLTAREN) 1 % GEL     . docusate sodium (COLACE) 100 MG capsule Take 1 capsule (100 mg total) by mouth 2 (two) times daily. 10 capsule 0  . enoxaparin (LOVENOX) 40 MG/0.4ML  injection Inject 0.4 mLs (40 mg total) into the skin daily. 12 Syringe 0  . escitalopram (LEXAPRO) 10 MG tablet     . furosemide (LASIX) 20 MG tablet Take 20 mg by mouth daily.    . furosemide (LASIX) 20 MG tablet     . GRALISE 600 MG TABS     . HYDROmorphone HCl (EXALGO) 12 MG T24A SR tablet     . Hylan 48 MG/6ML SOSY Inject 48 mg into the articular space.    Marland Kitchen ibuprofen (ADVIL) 800 MG tablet Take 1 tablet (800 mg total) by mouth every 8 (eight) hours as needed. 30 tablet 0  . lamoTRIgine (LAMICTAL) 100 MG tablet     . Lifitegrast (XIIDRA) 5 % SOLN     . LINZESS 290 MCG CAPS capsule     . methocarbamol (ROBAXIN) 500 MG tablet Take 1-2 tablets (500-1,000 mg total) by mouth every 6 (six) hours as needed for muscle spasms. 60 tablet 2  . morphine (MS CONTIN) 30 MG 12 hr tablet Take by mouth.    . morphine (MS CONTIN) 60 MG 12 hr tablet Take 60 mg by mouth every 12 (twelve) hours.    Marland Kitchen MOVANTIK 12.5 MG TABS     . NUVIGIL 150 MG tablet     . ondansetron (ZOFRAN) 4 MG tablet     . ondansetron (ZOFRAN) 4 MG tablet     .  oxycodone (ROXICODONE) 30 MG immediate release tablet Take 1 tablet (30 mg total) by mouth every 8 (eight) hours as needed (for breakthrough pain). 100 tablet 0  . oxyCODONE-acetaminophen (PERCOCET) 10-325 MG tablet     . Polyethyl Glycol-Propyl Glycol (SYSTANE OP) Apply 1 drop to eye 2 (two) times daily as needed (dry eyes).    . pramipexole (MIRAPEX) 0.125 MG tablet     . pravastatin (PRAVACHOL) 40 MG tablet Take 40 mg by mouth daily.    . pravastatin (PRAVACHOL) 40 MG tablet Take by mouth.    . PredniSONE 10 MG KIT Take as instructed for 12 days and a tapering dose 48 each 0  . pregabalin (LYRICA) 100 MG capsule Take by mouth daily.  0  . pregabalin (LYRICA) 150 MG capsule Take 150 mg by mouth daily.    . promethazine (PHENERGAN) 25 MG tablet Take 1 tablet (25 mg total) by mouth every 8 (eight) hours as needed for nausea or vomiting. 20 tablet 0  . Suvorexant (BELSOMRA) 15 MG  TABS     . tiZANidine (ZANAFLEX) 4 MG tablet     . topiramate (TOPAMAX) 100 MG tablet Take 100 mg by mouth 2 (two) times daily.    Marland Kitchen topiramate (TOPAMAX) 50 MG tablet     . venlafaxine XR (EFFEXOR-XR) 150 MG 24 hr capsule Take 150 mg by mouth daily.    Marland Kitchen venlafaxine XR (EFFEXOR-XR) 150 MG 24 hr capsule     . Vitamin D, Ergocalciferol, (DRISDOL) 50000 units CAPS capsule     . ZETIA 10 MG tablet      Current Facility-Administered Medications on File Prior to Visit  Medication Dose Route Frequency Provider Last Rate Last Dose  . triamcinolone acetonide (KENALOG) 10 MG/ML injection 10 mg  10 mg Other Once Landis Martins, DPM      . triamcinolone acetonide (KENALOG) 10 MG/ML injection 10 mg  10 mg Other Once Landis Martins, DPM      . triamcinolone acetonide (KENALOG) 10 MG/ML injection 10 mg  10 mg Other Once Landis Martins, DPM        Allergies  Allergen Reactions  . Pravastatin Sodium Other (See Comments)    Muscles  aches Muscles  aches   . Augmentin [Amoxicillin-Pot Clavulanate] Diarrhea    Objective: There were no vitals filed for this visit.  General: No acute distress, AAOx3  Right foot: Sutures and K wire intact with no gapping or dehiscence at surgical site, mild swelling to right forefoot, no erythema, no warmth, no drainage, no signs of infection noted, Capillary fill time <3 seconds in all digits, gross sensation present via light touch to right foot. No pain or crepitation with range of motion right foot however guarded due to postop.  No pain with calf compression.   Assessment and Plan:  Problem List Items Addressed This Visit    None    Visit Diagnoses    Hallux malleus of right foot    -  Primary   Contracture of tendon sheath       Prominent metatarsal head, unspecified laterality       S/P foot surgery, right       Right foot pain          -Patient seen and evaluated -Sutures left intact due to mild swelling will consider removal next week -Applied dry  sterile dressing to surgical site right foot secured with ACE wrap and stockinet  -Advised patient to make sure to keep dressings clean, dry,  and intact to right surgical site, removing the ACE as needed if it feels too tight -Advised patient to continue with cam boot on right foot -Advised patient to limit activity to necessity  -Advised patient to ice and elevate as instructed -Patient to return to office next week for completion of suture removal Landis Martins, DPM

## 2019-03-17 DIAGNOSIS — M1710 Unilateral primary osteoarthritis, unspecified knee: Secondary | ICD-10-CM | POA: Diagnosis not present

## 2019-03-17 DIAGNOSIS — G894 Chronic pain syndrome: Secondary | ICD-10-CM | POA: Diagnosis not present

## 2019-03-17 DIAGNOSIS — M79673 Pain in unspecified foot: Secondary | ICD-10-CM | POA: Diagnosis not present

## 2019-03-17 DIAGNOSIS — M961 Postlaminectomy syndrome, not elsewhere classified: Secondary | ICD-10-CM | POA: Diagnosis not present

## 2019-03-18 ENCOUNTER — Encounter: Payer: Medicare Other | Admitting: Sports Medicine

## 2019-03-24 ENCOUNTER — Other Ambulatory Visit: Payer: Self-pay

## 2019-03-24 ENCOUNTER — Encounter: Payer: Self-pay | Admitting: Sports Medicine

## 2019-03-24 ENCOUNTER — Ambulatory Visit (INDEPENDENT_AMBULATORY_CARE_PROVIDER_SITE_OTHER): Payer: Medicare Other | Admitting: Sports Medicine

## 2019-03-24 DIAGNOSIS — M216X9 Other acquired deformities of unspecified foot: Secondary | ICD-10-CM

## 2019-03-24 DIAGNOSIS — M2031 Hallux varus (acquired), right foot: Secondary | ICD-10-CM

## 2019-03-24 DIAGNOSIS — Z9889 Other specified postprocedural states: Secondary | ICD-10-CM

## 2019-03-24 DIAGNOSIS — M79671 Pain in right foot: Secondary | ICD-10-CM

## 2019-03-24 DIAGNOSIS — M6240 Contracture of muscle, unspecified site: Secondary | ICD-10-CM

## 2019-03-24 NOTE — Progress Notes (Signed)
Subjective: Sonya Allen is a 63 y.o. female patient seen today in office for POV # 3 (DOS 02-28-19), S/P right Keller implant and IPJ fusion at the hallux with scar contracture release at the second MPJ.  Patient denies pain at surgical site currently states that the pain has been doing good taking motrin as needed; patient denies headache, chest pain, shortness of breath, nausea, vomiting, fever, or chills. No other issues noted.   Patient Active Problem List   Diagnosis Date Noted  . Recurrent right knee instability 12/31/2017  . Acute pain of left shoulder 08/13/2017  . 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 Knee pain, right 01/23/2014  . 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  . Unilateral primary osteoarthritis, right knee 05/26/2013  . Other specified postprocedural states 01/31/2013    Current Outpatient Medications on File Prior to Visit  Medication Sig Dispense Refill  . betamethasone acetate-betamethasone sodium phosphate (CELESTONE) 6 (3-3) MG/ML injection Inject 12 mg into the articular space.    . bupivacaine, PF, (MARCAINE) 0.25 % SOLN injection Inject 25 mg into the skin.    . carisoprodol (SOMA) 350 MG tablet Take 350 mg by mouth 3 (three) times daily.    . carisoprodol (SOMA) 350 MG tablet     . clindamycin (CLEOCIN) 150 MG capsule Take 1 capsule (150 mg total) by mouth 2 (two) times daily. 14 capsule 0  . clonazePAM (KLONOPIN) 0.5 MG tablet Take 0.5 mg by mouth at bedtime. Restless legs    . clonazePAM (KLONOPIN) 0.5 MG tablet     . conjugated estrogens (PREMARIN) vaginal cream     . diclofenac (VOLTAREN) 50 MG EC tablet     . diclofenac sodium (VOLTAREN) 1 % GEL     . docusate sodium (COLACE) 100 MG capsule Take 1 capsule (100 mg total) by mouth 2 (two) times daily. 10 capsule 0  . enoxaparin (LOVENOX) 40 MG/0.4ML injection Inject 0.4 mLs (40 mg total) into the skin daily. 12 Syringe 0  . escitalopram (LEXAPRO) 10 MG tablet     . furosemide (LASIX) 20 MG tablet  Take 20 mg by mouth daily.    . furosemide (LASIX) 20 MG tablet     . GRALISE 600 MG TABS     . HYDROmorphone HCl (EXALGO) 12 MG T24A SR tablet     . Hylan 48 MG/6ML SOSY Inject 48 mg into the articular space.    Marland Kitchen ibuprofen (ADVIL) 800 MG tablet Take 1 tablet (800 mg total) by mouth every 8 (eight) hours as needed. 30 tablet 0  . lamoTRIgine (LAMICTAL) 100 MG tablet     . Lifitegrast (XIIDRA) 5 % SOLN     . LINZESS 290 MCG CAPS capsule     . methocarbamol (ROBAXIN) 500 MG tablet Take 1-2 tablets (500-1,000 mg total) by mouth every 6 (six) hours as needed for muscle spasms. 60 tablet 2  . morphine (MS CONTIN) 30 MG 12 hr tablet Take by mouth.    . morphine (MS CONTIN) 60 MG 12 hr tablet Take 60 mg by mouth every 12 (twelve) hours.    Marland Kitchen MOVANTIK 12.5 MG TABS     . NUVIGIL 150 MG tablet     . ondansetron (ZOFRAN) 4 MG tablet     . ondansetron (ZOFRAN) 4 MG tablet     . oxycodone (ROXICODONE) 30 MG immediate release tablet Take 1 tablet (30 mg total) by mouth every 8 (eight) hours as needed (for breakthrough pain). 100 tablet 0  .  oxyCODONE-acetaminophen (PERCOCET) 10-325 MG tablet     . Polyethyl Glycol-Propyl Glycol (SYSTANE OP) Apply 1 drop to eye 2 (two) times daily as needed (dry eyes).    . pramipexole (MIRAPEX) 0.125 MG tablet     . pravastatin (PRAVACHOL) 40 MG tablet Take 40 mg by mouth daily.    . pravastatin (PRAVACHOL) 40 MG tablet Take by mouth.    . PredniSONE 10 MG KIT Take as instructed for 12 days and a tapering dose 48 each 0  . pregabalin (LYRICA) 100 MG capsule Take by mouth daily.  0  . pregabalin (LYRICA) 150 MG capsule Take 150 mg by mouth daily.    . promethazine (PHENERGAN) 25 MG tablet Take 1 tablet (25 mg total) by mouth every 8 (eight) hours as needed for nausea or vomiting. 20 tablet 0  . Suvorexant (BELSOMRA) 15 MG TABS     . tiZANidine (ZANAFLEX) 4 MG tablet     . topiramate (TOPAMAX) 100 MG tablet Take 100 mg by mouth 2 (two) times daily.    Marland Kitchen topiramate  (TOPAMAX) 50 MG tablet     . venlafaxine XR (EFFEXOR-XR) 150 MG 24 hr capsule Take 150 mg by mouth daily.    Marland Kitchen venlafaxine XR (EFFEXOR-XR) 150 MG 24 hr capsule     . Vitamin D, Ergocalciferol, (DRISDOL) 50000 units CAPS capsule     . ZETIA 10 MG tablet      Current Facility-Administered Medications on File Prior to Visit  Medication Dose Route Frequency Provider Last Rate Last Dose  . triamcinolone acetonide (KENALOG) 10 MG/ML injection 10 mg  10 mg Other Once Landis Martins, DPM      . triamcinolone acetonide (KENALOG) 10 MG/ML injection 10 mg  10 mg Other Once Landis Martins, DPM      . triamcinolone acetonide (KENALOG) 10 MG/ML injection 10 mg  10 mg Other Once Landis Martins, DPM        Allergies  Allergen Reactions  . Pravastatin Sodium Other (See Comments)    Muscles  aches Muscles  aches   . Augmentin [Amoxicillin-Pot Clavulanate] Diarrhea    Objective: There were no vitals filed for this visit.  General: No acute distress, AAOx3  Right foot: Sutures and K wire intact with no gapping or dehiscence at surgical site, mild swelling to right forefoot, no erythema, no warmth, no drainage, no signs of infection noted, Capillary fill time <3 seconds in all digits, gross sensation present via light touch to right foot. No pain or crepitation with range of motion right foot however guarded due to postop.  No pain with calf compression.   Assessment and Plan:  Problem List Items Addressed This Visit    None    Visit Diagnoses    Hallux malleus of right foot    -  Primary   Contracture of tendon sheath       Prominent metatarsal head, unspecified laterality       S/P foot surgery, right       Right foot pain          -Patient seen and evaluated -Sutures removed -Applied dry sterile dressing to surgical site right foot secured with ACE wrap and stockinet  -Advised patient to make sure to keep dressings clean, dry, and intact to right surgical site, removing the ACE as needed  if it feels too tight -Advised patient to continue with cam boot on right foot -Advised patient to limit activity to necessity  -Advised patient to ice and  elevate as instructed -Patient to return to office next week for xray and kwire removal Landis Martins, DPM

## 2019-04-01 ENCOUNTER — Other Ambulatory Visit: Payer: Self-pay | Admitting: Sports Medicine

## 2019-04-01 ENCOUNTER — Encounter: Payer: Self-pay | Admitting: Sports Medicine

## 2019-04-01 ENCOUNTER — Ambulatory Visit (INDEPENDENT_AMBULATORY_CARE_PROVIDER_SITE_OTHER): Payer: Medicare Other | Admitting: Sports Medicine

## 2019-04-01 ENCOUNTER — Other Ambulatory Visit: Payer: Self-pay

## 2019-04-01 ENCOUNTER — Ambulatory Visit (INDEPENDENT_AMBULATORY_CARE_PROVIDER_SITE_OTHER): Payer: Medicare Other

## 2019-04-01 VITALS — Temp 96.0°F | Resp 16

## 2019-04-01 DIAGNOSIS — M6240 Contracture of muscle, unspecified site: Secondary | ICD-10-CM

## 2019-04-01 DIAGNOSIS — M2031 Hallux varus (acquired), right foot: Secondary | ICD-10-CM | POA: Diagnosis not present

## 2019-04-01 DIAGNOSIS — M79671 Pain in right foot: Secondary | ICD-10-CM

## 2019-04-01 DIAGNOSIS — Z9889 Other specified postprocedural states: Secondary | ICD-10-CM

## 2019-04-01 DIAGNOSIS — M216X9 Other acquired deformities of unspecified foot: Secondary | ICD-10-CM

## 2019-04-01 NOTE — Progress Notes (Signed)
Subjective: Sonya Allen is a 63 y.o. female patient seen today in office for POV # 4 (DOS 02-28-19), S/P right Keller implant and IPJ fusion at the hallux with scar contracture release at the second MPJ.  Patient denies pain at surgical site currently states that she is doing good, no pain. Patient denies headache, chest pain, shortness of breath, nausea, vomiting, fever, or chills. No other issues noted.   Patient Active Problem List   Diagnosis Date Noted  . Recurrent right knee instability 12/31/2017  . Acute pain of left shoulder 08/13/2017  . 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 Knee pain, right 01/23/2014  . 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  . Unilateral primary osteoarthritis, right knee 05/26/2013  . Other specified postprocedural states 01/31/2013    Current Outpatient Medications on File Prior to Visit  Medication Sig Dispense Refill  . betamethasone acetate-betamethasone sodium phosphate (CELESTONE) 6 (3-3) MG/ML injection Inject 12 mg into the articular space.    . bupivacaine, PF, (MARCAINE) 0.25 % SOLN injection Inject 25 mg into the skin.    . carisoprodol (SOMA) 350 MG tablet Take 350 mg by mouth 3 (three) times daily.    . carisoprodol (SOMA) 350 MG tablet     . clindamycin (CLEOCIN) 150 MG capsule Take 1 capsule (150 mg total) by mouth 2 (two) times daily. 14 capsule 0  . clonazePAM (KLONOPIN) 0.5 MG tablet Take 0.5 mg by mouth at bedtime. Restless legs    . clonazePAM (KLONOPIN) 0.5 MG tablet     . conjugated estrogens (PREMARIN) vaginal cream     . diclofenac (VOLTAREN) 50 MG EC tablet     . diclofenac sodium (VOLTAREN) 1 % GEL     . docusate sodium (COLACE) 100 MG capsule Take 1 capsule (100 mg total) by mouth 2 (two) times daily. 10 capsule 0  . enoxaparin (LOVENOX) 40 MG/0.4ML injection Inject 0.4 mLs (40 mg total) into the skin daily. 12 Syringe 0  . escitalopram (LEXAPRO) 10 MG tablet     . furosemide (LASIX) 20 MG tablet Take 20 mg by mouth  daily.    . furosemide (LASIX) 20 MG tablet     . GRALISE 600 MG TABS     . HYDROmorphone HCl (EXALGO) 12 MG T24A SR tablet     . Hylan 48 MG/6ML SOSY Inject 48 mg into the articular space.    Marland Kitchen ibuprofen (ADVIL) 800 MG tablet Take 1 tablet (800 mg total) by mouth every 8 (eight) hours as needed. 30 tablet 0  . lamoTRIgine (LAMICTAL) 100 MG tablet     . Lifitegrast (XIIDRA) 5 % SOLN     . LINZESS 290 MCG CAPS capsule     . methocarbamol (ROBAXIN) 500 MG tablet Take 1-2 tablets (500-1,000 mg total) by mouth every 6 (six) hours as needed for muscle spasms. 60 tablet 2  . morphine (MS CONTIN) 30 MG 12 hr tablet Take by mouth.    . morphine (MS CONTIN) 60 MG 12 hr tablet Take 60 mg by mouth every 12 (twelve) hours.    Marland Kitchen MOVANTIK 12.5 MG TABS     . NUVIGIL 150 MG tablet     . ondansetron (ZOFRAN) 4 MG tablet     . ondansetron (ZOFRAN) 4 MG tablet     . oxycodone (ROXICODONE) 30 MG immediate release tablet Take 1 tablet (30 mg total) by mouth every 8 (eight) hours as needed (for breakthrough pain). 100 tablet 0  . oxyCODONE-acetaminophen (PERCOCET)  10-325 MG tablet     . Polyethyl Glycol-Propyl Glycol (SYSTANE OP) Apply 1 drop to eye 2 (two) times daily as needed (dry eyes).    . pramipexole (MIRAPEX) 0.125 MG tablet     . pravastatin (PRAVACHOL) 40 MG tablet Take 40 mg by mouth daily.    . pravastatin (PRAVACHOL) 40 MG tablet Take by mouth.    . PredniSONE 10 MG KIT Take as instructed for 12 days and a tapering dose 48 each 0  . pregabalin (LYRICA) 100 MG capsule Take by mouth daily.  0  . pregabalin (LYRICA) 150 MG capsule Take 150 mg by mouth daily.    . promethazine (PHENERGAN) 25 MG tablet Take 1 tablet (25 mg total) by mouth every 8 (eight) hours as needed for nausea or vomiting. 20 tablet 0  . Suvorexant (BELSOMRA) 15 MG TABS     . tiZANidine (ZANAFLEX) 4 MG tablet     . topiramate (TOPAMAX) 100 MG tablet Take 100 mg by mouth 2 (two) times daily.    Marland Kitchen topiramate (TOPAMAX) 50 MG tablet      . venlafaxine XR (EFFEXOR-XR) 150 MG 24 hr capsule Take 150 mg by mouth daily.    Marland Kitchen venlafaxine XR (EFFEXOR-XR) 150 MG 24 hr capsule     . Vitamin D, Ergocalciferol, (DRISDOL) 50000 units CAPS capsule     . ZETIA 10 MG tablet      Current Facility-Administered Medications on File Prior to Visit  Medication Dose Route Frequency Provider Last Rate Last Dose  . triamcinolone acetonide (KENALOG) 10 MG/ML injection 10 mg  10 mg Other Once Landis Martins, DPM      . triamcinolone acetonide (KENALOG) 10 MG/ML injection 10 mg  10 mg Other Once Landis Martins, DPM      . triamcinolone acetonide (KENALOG) 10 MG/ML injection 10 mg  10 mg Other Once Landis Martins, DPM        Allergies  Allergen Reactions  . Pravastatin Sodium Other (See Comments)    Muscles  aches Muscles  aches   . Augmentin [Amoxicillin-Pot Clavulanate] Diarrhea    Objective: There were no vitals filed for this visit.  General: No acute distress, AAOx3  Right foot: K wire intact with no gapping or dehiscence at surgical site, mild swelling to right forefoot, no erythema, no warmth, no drainage, no signs of infection noted, Capillary fill time <3 seconds in all digits, gross sensation present via light touch to right foot. No pain or crepitation with range of motion right foot however guarded due to postop.  No pain with calf compression.  Xrays, right foot: Consistent with post op status     Assessment and Plan:  Problem List Items Addressed This Visit    None    Visit Diagnoses    Hallux malleus of right foot    -  Primary   Contracture of tendon sheath       Prominent metatarsal head, unspecified laterality       S/P foot surgery, right       Right foot pain         -Patient seen and evaluated -Kwire removed -Steristrips applied. Applied ACE and advised patient to use as needed for edema control -Advised patient may transition out of CAM boot to post op shoe -Advised patient to limit activity to  tolerance -Advised patient to ice and elevate as instructed -Patient to return to office in 2 weeks to transition from post op shoe to tennis shoe.  Landis Martins, DPM

## 2019-04-06 ENCOUNTER — Other Ambulatory Visit: Payer: Self-pay | Admitting: Sports Medicine

## 2019-04-06 DIAGNOSIS — M2031 Hallux varus (acquired), right foot: Secondary | ICD-10-CM

## 2019-04-06 DIAGNOSIS — Z9889 Other specified postprocedural states: Secondary | ICD-10-CM

## 2019-04-15 ENCOUNTER — Ambulatory Visit (INDEPENDENT_AMBULATORY_CARE_PROVIDER_SITE_OTHER): Payer: Medicare Other | Admitting: Sports Medicine

## 2019-04-15 ENCOUNTER — Encounter: Payer: Self-pay | Admitting: Sports Medicine

## 2019-04-15 ENCOUNTER — Other Ambulatory Visit: Payer: Self-pay

## 2019-04-15 VITALS — Temp 97.3°F | Resp 16

## 2019-04-15 DIAGNOSIS — M2031 Hallux varus (acquired), right foot: Secondary | ICD-10-CM

## 2019-04-15 DIAGNOSIS — M79671 Pain in right foot: Secondary | ICD-10-CM

## 2019-04-15 DIAGNOSIS — M216X9 Other acquired deformities of unspecified foot: Secondary | ICD-10-CM

## 2019-04-15 DIAGNOSIS — Z9889 Other specified postprocedural states: Secondary | ICD-10-CM

## 2019-04-15 DIAGNOSIS — M6240 Contracture of muscle, unspecified site: Secondary | ICD-10-CM

## 2019-04-15 NOTE — Progress Notes (Signed)
Subjective: Sonya Allen is a 63 y.o. female patient seen today in office for POV # 5 (DOS 02-28-19), S/P right Keller implant and IPJ fusion at the hallux with scar contracture release at the second MPJ.  Patient denies pain at surgical site currently states that she is doing good but is concerned that the toe may elevate again since the Herma Ard was removed, no pain to the toe. Patient denies headache, chest pain, shortness of breath, nausea, vomiting, fever, or chills. No other issues noted.   Patient Active Problem List   Diagnosis Date Noted  . Recurrent right knee instability 12/31/2017  . Acute pain of left shoulder 08/13/2017  . 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 Knee pain, right 01/23/2014  . 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  . Unilateral primary osteoarthritis, right knee 05/26/2013  . Other specified postprocedural states 01/31/2013    Current Outpatient Medications on File Prior to Visit  Medication Sig Dispense Refill  . betamethasone acetate-betamethasone sodium phosphate (CELESTONE) 6 (3-3) MG/ML injection Inject 12 mg into the articular space.    . bupivacaine, PF, (MARCAINE) 0.25 % SOLN injection Inject 25 mg into the skin.    . carisoprodol (SOMA) 350 MG tablet Take 350 mg by mouth 3 (three) times daily.    . carisoprodol (SOMA) 350 MG tablet     . clindamycin (CLEOCIN) 150 MG capsule Take 1 capsule (150 mg total) by mouth 2 (two) times daily. 14 capsule 0  . clonazePAM (KLONOPIN) 0.5 MG tablet Take 0.5 mg by mouth at bedtime. Restless legs    . clonazePAM (KLONOPIN) 0.5 MG tablet     . conjugated estrogens (PREMARIN) vaginal cream     . diclofenac (VOLTAREN) 50 MG EC tablet     . diclofenac sodium (VOLTAREN) 1 % GEL     . docusate sodium (COLACE) 100 MG capsule Take 1 capsule (100 mg total) by mouth 2 (two) times daily. 10 capsule 0  . enoxaparin (LOVENOX) 40 MG/0.4ML injection Inject 0.4 mLs (40 mg total) into the skin daily. 12 Syringe 0  . escitalopram  (LEXAPRO) 10 MG tablet     . furosemide (LASIX) 20 MG tablet Take 20 mg by mouth daily.    . furosemide (LASIX) 20 MG tablet     . GRALISE 600 MG TABS     . HYDROmorphone HCl (EXALGO) 12 MG T24A SR tablet     . Hylan 48 MG/6ML SOSY Inject 48 mg into the articular space.    Marland Kitchen ibuprofen (ADVIL) 800 MG tablet Take 1 tablet (800 mg total) by mouth every 8 (eight) hours as needed. 30 tablet 0  . lamoTRIgine (LAMICTAL) 100 MG tablet     . Lifitegrast (XIIDRA) 5 % SOLN     . LINZESS 290 MCG CAPS capsule     . methocarbamol (ROBAXIN) 500 MG tablet Take 1-2 tablets (500-1,000 mg total) by mouth every 6 (six) hours as needed for muscle spasms. 60 tablet 2  . morphine (MS CONTIN) 30 MG 12 hr tablet Take by mouth.    . morphine (MS CONTIN) 60 MG 12 hr tablet Take 60 mg by mouth every 12 (twelve) hours.    Marland Kitchen MOVANTIK 12.5 MG TABS     . NUVIGIL 150 MG tablet     . ondansetron (ZOFRAN) 4 MG tablet     . ondansetron (ZOFRAN) 4 MG tablet     . oxycodone (ROXICODONE) 30 MG immediate release tablet Take 1 tablet (30 mg total) by  mouth every 8 (eight) hours as needed (for breakthrough pain). 100 tablet 0  . oxyCODONE-acetaminophen (PERCOCET) 10-325 MG tablet     . Polyethyl Glycol-Propyl Glycol (SYSTANE OP) Apply 1 drop to eye 2 (two) times daily as needed (dry eyes).    . pramipexole (MIRAPEX) 0.125 MG tablet     . pravastatin (PRAVACHOL) 40 MG tablet Take 40 mg by mouth daily.    . pravastatin (PRAVACHOL) 40 MG tablet Take by mouth.    . PredniSONE 10 MG KIT Take as instructed for 12 days and a tapering dose 48 each 0  . pregabalin (LYRICA) 100 MG capsule Take by mouth daily.  0  . pregabalin (LYRICA) 150 MG capsule Take 150 mg by mouth daily.    . promethazine (PHENERGAN) 25 MG tablet Take 1 tablet (25 mg total) by mouth every 8 (eight) hours as needed for nausea or vomiting. 20 tablet 0  . Suvorexant (BELSOMRA) 15 MG TABS     . tiZANidine (ZANAFLEX) 4 MG tablet     . topiramate (TOPAMAX) 100 MG tablet  Take 100 mg by mouth 2 (two) times daily.    Marland Kitchen topiramate (TOPAMAX) 50 MG tablet     . venlafaxine XR (EFFEXOR-XR) 150 MG 24 hr capsule Take 150 mg by mouth daily.    Marland Kitchen venlafaxine XR (EFFEXOR-XR) 150 MG 24 hr capsule     . Vitamin D, Ergocalciferol, (DRISDOL) 50000 units CAPS capsule     . ZETIA 10 MG tablet      Current Facility-Administered Medications on File Prior to Visit  Medication Dose Route Frequency Provider Last Rate Last Dose  . triamcinolone acetonide (KENALOG) 10 MG/ML injection 10 mg  10 mg Other Once Landis Martins, DPM      . triamcinolone acetonide (KENALOG) 10 MG/ML injection 10 mg  10 mg Other Once Landis Martins, DPM      . triamcinolone acetonide (KENALOG) 10 MG/ML injection 10 mg  10 mg Other Once Landis Martins, DPM        Allergies  Allergen Reactions  . Pravastatin Sodium Other (See Comments)    Muscles  aches Muscles  aches   . Augmentin [Amoxicillin-Pot Clavulanate] Diarrhea    Objective: There were no vitals filed for this visit.  General: No acute distress, AAOx3  Right foot: Surgical site(s) well healed. + mild swelling to right forefoot, no erythema, no warmth, no drainage, no signs of infection noted, Capillary fill time <3 seconds in all digits, gross sensation present via light touch to right foot. No pain or crepitation with range of motion right foot, mild limited PF but able to be obtained with manipulation.  No pain with calf compression.  Assessment and Plan:  Problem List Items Addressed This Visit    None    Visit Diagnoses    S/P foot surgery, right    -  Primary   Prominent metatarsal head, unspecified laterality       Contracture of tendon sheath       Hallux malleus of right foot       Right foot pain         -Patient seen and evaluated -Advised scar creams and ROM exercises as tolerated  -May use toe alignment splint to help with stretching the toe as well -Advised patient to ice and elevate as needed -May use normal  shoe -Patient to return to office in 4 weeks for xray and post op check. Landis Martins, DPM

## 2019-04-19 DIAGNOSIS — G894 Chronic pain syndrome: Secondary | ICD-10-CM | POA: Diagnosis not present

## 2019-04-19 DIAGNOSIS — M961 Postlaminectomy syndrome, not elsewhere classified: Secondary | ICD-10-CM | POA: Diagnosis not present

## 2019-04-19 DIAGNOSIS — M1710 Unilateral primary osteoarthritis, unspecified knee: Secondary | ICD-10-CM | POA: Diagnosis not present

## 2019-04-19 DIAGNOSIS — M79673 Pain in unspecified foot: Secondary | ICD-10-CM | POA: Diagnosis not present

## 2019-04-19 DIAGNOSIS — Z79899 Other long term (current) drug therapy: Secondary | ICD-10-CM | POA: Diagnosis not present

## 2019-04-19 DIAGNOSIS — Z79891 Long term (current) use of opiate analgesic: Secondary | ICD-10-CM | POA: Diagnosis not present

## 2019-05-13 ENCOUNTER — Ambulatory Visit (INDEPENDENT_AMBULATORY_CARE_PROVIDER_SITE_OTHER): Payer: Medicare Other

## 2019-05-13 ENCOUNTER — Other Ambulatory Visit: Payer: Self-pay

## 2019-05-13 ENCOUNTER — Ambulatory Visit (INDEPENDENT_AMBULATORY_CARE_PROVIDER_SITE_OTHER): Payer: Medicare Other | Admitting: Sports Medicine

## 2019-05-13 ENCOUNTER — Encounter: Payer: Self-pay | Admitting: Sports Medicine

## 2019-05-13 DIAGNOSIS — M2061 Acquired deformities of toe(s), unspecified, right foot: Secondary | ICD-10-CM

## 2019-05-13 DIAGNOSIS — M216X9 Other acquired deformities of unspecified foot: Secondary | ICD-10-CM

## 2019-05-13 DIAGNOSIS — M216X1 Other acquired deformities of right foot: Secondary | ICD-10-CM

## 2019-05-13 DIAGNOSIS — M6240 Contracture of muscle, unspecified site: Secondary | ICD-10-CM

## 2019-05-13 DIAGNOSIS — L570 Actinic keratosis: Secondary | ICD-10-CM

## 2019-05-13 DIAGNOSIS — Z9889 Other specified postprocedural states: Secondary | ICD-10-CM

## 2019-05-13 MED ORDER — TRIAMCINOLONE ACETONIDE 10 MG/ML IJ SUSP
10.0000 mg | Freq: Once | INTRAMUSCULAR | Status: AC
  Administered 2019-05-13: 10 mg

## 2019-05-13 NOTE — Progress Notes (Signed)
Subjective: Sonya Allen is a 63 y.o. female patient seen today in office for POV # 6 (DOS 02-28-19), S/P right Keller implant and IPJ fusion at the hallux with scar contracture release at the second MPJ.  Patient denies pain at surgical site currently states that she is doing good but has elevation, no pain to the toe now pain at callus area under 1st toe joint. Patient denies headache, chest pain, shortness of breath, nausea, vomiting, fever, or chills. No other issues noted.   Patient Active Problem List   Diagnosis Date Noted  . Recurrent right knee instability 12/31/2017  . Acute pain of left shoulder 08/13/2017  . 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 Knee pain, right 01/23/2014  . 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  . Unilateral primary osteoarthritis, right knee 05/26/2013  . Other specified postprocedural states 01/31/2013    Current Outpatient Medications on File Prior to Visit  Medication Sig Dispense Refill  . betamethasone acetate-betamethasone sodium phosphate (CELESTONE) 6 (3-3) MG/ML injection Inject 12 mg into the articular space.    . bupivacaine, PF, (MARCAINE) 0.25 % SOLN injection Inject 25 mg into the skin.    . carisoprodol (SOMA) 350 MG tablet Take 350 mg by mouth 3 (three) times daily.    . carisoprodol (SOMA) 350 MG tablet     . clindamycin (CLEOCIN) 150 MG capsule Take 1 capsule (150 mg total) by mouth 2 (two) times daily. 14 capsule 0  . clonazePAM (KLONOPIN) 0.5 MG tablet Take 0.5 mg by mouth at bedtime. Restless legs    . clonazePAM (KLONOPIN) 0.5 MG tablet     . conjugated estrogens (PREMARIN) vaginal cream     . diclofenac (VOLTAREN) 50 MG EC tablet     . diclofenac sodium (VOLTAREN) 1 % GEL     . docusate sodium (COLACE) 100 MG capsule Take 1 capsule (100 mg total) by mouth 2 (two) times daily. 10 capsule 0  . enoxaparin (LOVENOX) 40 MG/0.4ML injection Inject 0.4 mLs (40 mg total) into the skin daily. 12 Syringe 0  . escitalopram (LEXAPRO) 10  MG tablet     . furosemide (LASIX) 20 MG tablet Take 20 mg by mouth daily.    . furosemide (LASIX) 20 MG tablet     . GRALISE 600 MG TABS     . HYDROmorphone HCl (EXALGO) 12 MG T24A SR tablet     . Hylan 48 MG/6ML SOSY Inject 48 mg into the articular space.    Marland Kitchen ibuprofen (ADVIL) 800 MG tablet Take 1 tablet (800 mg total) by mouth every 8 (eight) hours as needed. 30 tablet 0  . lamoTRIgine (LAMICTAL) 100 MG tablet     . Lifitegrast (XIIDRA) 5 % SOLN     . LINZESS 290 MCG CAPS capsule     . methocarbamol (ROBAXIN) 500 MG tablet Take 1-2 tablets (500-1,000 mg total) by mouth every 6 (six) hours as needed for muscle spasms. 60 tablet 2  . morphine (MS CONTIN) 30 MG 12 hr tablet Take by mouth.    . morphine (MS CONTIN) 60 MG 12 hr tablet Take 60 mg by mouth every 12 (twelve) hours.    Marland Kitchen MOVANTIK 12.5 MG TABS     . NUVIGIL 150 MG tablet     . ondansetron (ZOFRAN) 4 MG tablet     . ondansetron (ZOFRAN) 4 MG tablet     . oxycodone (ROXICODONE) 30 MG immediate release tablet Take 1 tablet (30 mg total) by mouth every  8 (eight) hours as needed (for breakthrough pain). 100 tablet 0  . oxyCODONE-acetaminophen (PERCOCET) 10-325 MG tablet     . Polyethyl Glycol-Propyl Glycol (SYSTANE OP) Apply 1 drop to eye 2 (two) times daily as needed (dry eyes).    . pramipexole (MIRAPEX) 0.125 MG tablet     . pravastatin (PRAVACHOL) 40 MG tablet Take 40 mg by mouth daily.    . pravastatin (PRAVACHOL) 40 MG tablet Take by mouth.    . PredniSONE 10 MG KIT Take as instructed for 12 days and a tapering dose 48 each 0  . pregabalin (LYRICA) 100 MG capsule Take by mouth daily.  0  . pregabalin (LYRICA) 150 MG capsule Take 150 mg by mouth daily.    . promethazine (PHENERGAN) 25 MG tablet Take 1 tablet (25 mg total) by mouth every 8 (eight) hours as needed for nausea or vomiting. 20 tablet 0  . Suvorexant (BELSOMRA) 15 MG TABS     . tiZANidine (ZANAFLEX) 4 MG tablet     . topiramate (TOPAMAX) 100 MG tablet Take 100 mg by  mouth 2 (two) times daily.    Marland Kitchen topiramate (TOPAMAX) 50 MG tablet     . venlafaxine XR (EFFEXOR-XR) 150 MG 24 hr capsule Take 150 mg by mouth daily.    Marland Kitchen venlafaxine XR (EFFEXOR-XR) 150 MG 24 hr capsule     . Vitamin D, Ergocalciferol, (DRISDOL) 50000 units CAPS capsule     . ZETIA 10 MG tablet      Current Facility-Administered Medications on File Prior to Visit  Medication Dose Route Frequency Provider Last Rate Last Dose  . triamcinolone acetonide (KENALOG) 10 MG/ML injection 10 mg  10 mg Other Once Landis Martins, DPM      . triamcinolone acetonide (KENALOG) 10 MG/ML injection 10 mg  10 mg Other Once Landis Martins, DPM      . triamcinolone acetonide (KENALOG) 10 MG/ML injection 10 mg  10 mg Other Once Landis Martins, DPM        Allergies  Allergen Reactions  . Pravastatin Sodium Other (See Comments)    Muscles  aches Muscles  aches   . Augmentin [Amoxicillin-Pot Clavulanate] Diarrhea    Objective: There were no vitals filed for this visit.  General: No acute distress, AAOx3  Right foot: Surgical site well healed. +callus sub met 1, mild swelling to right forefoot, no erythema, no warmth, no drainage, no signs of infection noted, Capillary fill time <3 seconds in all digits, gross sensation present via light touch to right foot. No pain or crepitation with range of motion right foot, mild limited PF but able to be obtained with manipulation.  + Weightbearing elevation 1st toe that is limited by marked ankle deformity that is sitting is a varus position putting influence on the foot with the entire medial column pronated. No pain with calf compression.  Xray implant and ankle hardware intact  Assessment and Plan:  Problem List Items Addressed This Visit    None    Visit Diagnoses    Prominent metatarsal head, unspecified laterality    -  Primary   Relevant Orders   DG Foot Complete Right (Completed)   S/P foot surgery, right       Contracture of tendon sheath        Acquired deformity of right toe       Keratosis         -Patient seen and evaluated -After oral consent and aseptic prep, injected a mixture containing  1 ml of 2%  plain lidocaine, 1 ml 0.5% plain marcaine, 0.5 ml of kenalog 10 and 0.5 ml of dexamethasone phosphate into right sub met 1 without complication. Post-injection care discussed with patient.  -Mechanically debrided callus sub met 1 using sterile chisel blade -Advised scar creams and ROM exercises as tolerated  -May use toe alignment splint to help with stretching the toe as well and dispensed bunion pad/sleve -Advised patient to ice and elevate as needed -Patient to return to office in 4 weeks for post op check. Landis Martins, DPM

## 2019-05-17 DIAGNOSIS — G894 Chronic pain syndrome: Secondary | ICD-10-CM | POA: Diagnosis not present

## 2019-05-17 DIAGNOSIS — M79673 Pain in unspecified foot: Secondary | ICD-10-CM | POA: Diagnosis not present

## 2019-05-17 DIAGNOSIS — M542 Cervicalgia: Secondary | ICD-10-CM | POA: Diagnosis not present

## 2019-05-17 DIAGNOSIS — M961 Postlaminectomy syndrome, not elsewhere classified: Secondary | ICD-10-CM | POA: Diagnosis not present

## 2019-06-09 ENCOUNTER — Other Ambulatory Visit: Payer: Self-pay

## 2019-06-09 ENCOUNTER — Encounter: Payer: Self-pay | Admitting: Sports Medicine

## 2019-06-09 ENCOUNTER — Ambulatory Visit (INDEPENDENT_AMBULATORY_CARE_PROVIDER_SITE_OTHER): Payer: Medicare Other | Admitting: Sports Medicine

## 2019-06-09 DIAGNOSIS — L570 Actinic keratosis: Secondary | ICD-10-CM | POA: Diagnosis not present

## 2019-06-09 DIAGNOSIS — M6240 Contracture of muscle, unspecified site: Secondary | ICD-10-CM

## 2019-06-09 DIAGNOSIS — M216X9 Other acquired deformities of unspecified foot: Secondary | ICD-10-CM | POA: Diagnosis not present

## 2019-06-09 DIAGNOSIS — Z9889 Other specified postprocedural states: Secondary | ICD-10-CM

## 2019-06-09 DIAGNOSIS — M205X1 Other deformities of toe(s) (acquired), right foot: Secondary | ICD-10-CM

## 2019-06-09 NOTE — Progress Notes (Signed)
Subjective: Sonya Allen is a 63 y.o. female patient seen today in office for POV # 7 (DOS 02-28-19), S/P right Keller implant and IPJ fusion at the hallux with scar contracture release at the second MPJ.  Patient denies pain at surgical site but does states that her toe is still sticking up. Patient  Patient denies headache, chest pain, shortness of breath, nausea, vomiting, fever, or chills. No other issues noted.   Patient Active Problem List   Diagnosis Date Noted  . Recurrent right knee instability 12/31/2017  . Acute pain of left shoulder 08/13/2017  . 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 Knee pain, right 01/23/2014  . 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  . Unilateral primary osteoarthritis, right knee 05/26/2013  . Other specified postprocedural states 01/31/2013    Current Outpatient Medications on File Prior to Visit  Medication Sig Dispense Refill  . betamethasone acetate-betamethasone sodium phosphate (CELESTONE) 6 (3-3) MG/ML injection Inject 12 mg into the articular space.    . bupivacaine, PF, (MARCAINE) 0.25 % SOLN injection Inject 25 mg into the skin.    . carisoprodol (SOMA) 350 MG tablet Take 350 mg by mouth 3 (three) times daily.    . carisoprodol (SOMA) 350 MG tablet     . clindamycin (CLEOCIN) 150 MG capsule Take 1 capsule (150 mg total) by mouth 2 (two) times daily. 14 capsule 0  . clonazePAM (KLONOPIN) 0.5 MG tablet Take 0.5 mg by mouth at bedtime. Restless legs    . clonazePAM (KLONOPIN) 0.5 MG tablet     . clonazePAM (KLONOPIN) 1 MG tablet     . conjugated estrogens (PREMARIN) vaginal cream     . diclofenac (CATAFLAM) 50 MG tablet     . diclofenac (VOLTAREN) 50 MG EC tablet     . diclofenac sodium (VOLTAREN) 1 % GEL     . docusate sodium (COLACE) 100 MG capsule Take 1 capsule (100 mg total) by mouth 2 (two) times daily. 10 capsule 0  . enoxaparin (LOVENOX) 40 MG/0.4ML injection Inject 0.4 mLs (40 mg total) into the skin daily. 12 Syringe 0  .  escitalopram (LEXAPRO) 10 MG tablet     . furosemide (LASIX) 20 MG tablet Take 20 mg by mouth daily.    . furosemide (LASIX) 20 MG tablet     . GRALISE 600 MG TABS     . HYDROmorphone HCl (EXALGO) 12 MG T24A SR tablet     . Hylan 48 MG/6ML SOSY Inject 48 mg into the articular space.    Marland Kitchen ibuprofen (ADVIL) 800 MG tablet Take 1 tablet (800 mg total) by mouth every 8 (eight) hours as needed. 30 tablet 0  . lamoTRIgine (LAMICTAL) 100 MG tablet     . Lifitegrast (XIIDRA) 5 % SOLN     . LINZESS 290 MCG CAPS capsule     . methocarbamol (ROBAXIN) 500 MG tablet Take 1-2 tablets (500-1,000 mg total) by mouth every 6 (six) hours as needed for muscle spasms. 60 tablet 2  . morphine (MS CONTIN) 30 MG 12 hr tablet Take by mouth.    . morphine (MS CONTIN) 60 MG 12 hr tablet Take 60 mg by mouth every 12 (twelve) hours.    Marland Kitchen MOVANTIK 12.5 MG TABS     . NUVIGIL 150 MG tablet     . ondansetron (ZOFRAN) 4 MG tablet     . ondansetron (ZOFRAN) 4 MG tablet     . oxycodone (ROXICODONE) 30 MG immediate release tablet Take  1 tablet (30 mg total) by mouth every 8 (eight) hours as needed (for breakthrough pain). 100 tablet 0  . oxyCODONE-acetaminophen (PERCOCET) 10-325 MG tablet     . Polyethyl Glycol-Propyl Glycol (SYSTANE OP) Apply 1 drop to eye 2 (two) times daily as needed (dry eyes).    . pramipexole (MIRAPEX) 0.125 MG tablet     . pravastatin (PRAVACHOL) 40 MG tablet Take 40 mg by mouth daily.    . pravastatin (PRAVACHOL) 40 MG tablet Take by mouth.    . PredniSONE 10 MG KIT Take as instructed for 12 days and a tapering dose 48 each 0  . pregabalin (LYRICA) 100 MG capsule Take by mouth daily.  0  . pregabalin (LYRICA) 150 MG capsule Take 150 mg by mouth daily.    . promethazine (PHENERGAN) 25 MG tablet Take 1 tablet (25 mg total) by mouth every 8 (eight) hours as needed for nausea or vomiting. 20 tablet 0  . Suvorexant (BELSOMRA) 15 MG TABS     . tiZANidine (ZANAFLEX) 4 MG tablet     . topiramate (TOPAMAX)  100 MG tablet Take 100 mg by mouth 2 (two) times daily.    Marland Kitchen topiramate (TOPAMAX) 50 MG tablet     . venlafaxine XR (EFFEXOR-XR) 150 MG 24 hr capsule Take 150 mg by mouth daily.    Marland Kitchen venlafaxine XR (EFFEXOR-XR) 150 MG 24 hr capsule     . Vitamin D, Ergocalciferol, (DRISDOL) 50000 units CAPS capsule     . ZETIA 10 MG tablet      Current Facility-Administered Medications on File Prior to Visit  Medication Dose Route Frequency Provider Last Rate Last Dose  . triamcinolone acetonide (KENALOG) 10 MG/ML injection 10 mg  10 mg Other Once Landis Martins, DPM      . triamcinolone acetonide (KENALOG) 10 MG/ML injection 10 mg  10 mg Other Once Landis Martins, DPM      . triamcinolone acetonide (KENALOG) 10 MG/ML injection 10 mg  10 mg Other Once Landis Martins, DPM        Allergies  Allergen Reactions  . Pravastatin Sodium Other (See Comments)    Muscles  aches Muscles  aches   . Augmentin [Amoxicillin-Pot Clavulanate] Diarrhea    Objective: There were no vitals filed for this visit.  General: No acute distress, AAOx3  Right foot: Surgical site well healed. +callus sub met 1, mild swelling to right forefoot, no erythema, no warmth, no drainage, no signs of infection noted, Capillary fill time <3 seconds in all digits, gross sensation present via light touch to right foot. No pain or crepitation with range of motion right foot, mild limited PF but able to be obtained with manipulation.  + Weightbearing elevation 1st toe that is limited by marked ankle deformity that is sitting is a varus position putting influence on the foot with the entire medial column pronated like before. No pain with calf compression.  Assessment and Plan:  Problem List Items Addressed This Visit    None    Visit Diagnoses    Contracture of tendon sheath    -  Primary   Hallux extensus, acquired, right       S/P foot surgery, right       Keratosis       Prominent metatarsal head, unspecified laterality          -Patient seen and evaluated -Discussed with patient hallux extensus at right great toe  -Mechanically debrided callus sub met 1 using sterile chisel  blade -Dispensed foot miracle cream  -Advised scar creams and ROM exercises as tolerated  -May use toe alignment splint to help with stretching the toe as well and dispensed bunion pad/sleve -Advised patient to ice and elevate as needed -Patient to return to office in 2-3 weeks for surgery consult and new xrays. Will plan to fuse the 1st MTPJ.  Landis Martins, DPM

## 2019-06-14 DIAGNOSIS — G894 Chronic pain syndrome: Secondary | ICD-10-CM | POA: Diagnosis not present

## 2019-06-14 DIAGNOSIS — M961 Postlaminectomy syndrome, not elsewhere classified: Secondary | ICD-10-CM | POA: Diagnosis not present

## 2019-06-14 DIAGNOSIS — M79673 Pain in unspecified foot: Secondary | ICD-10-CM | POA: Diagnosis not present

## 2019-06-14 DIAGNOSIS — M542 Cervicalgia: Secondary | ICD-10-CM | POA: Diagnosis not present

## 2019-06-30 ENCOUNTER — Other Ambulatory Visit: Payer: Self-pay

## 2019-06-30 ENCOUNTER — Ambulatory Visit: Payer: Medicare Other | Admitting: Sports Medicine

## 2019-06-30 ENCOUNTER — Ambulatory Visit: Payer: Self-pay

## 2019-06-30 ENCOUNTER — Other Ambulatory Visit: Payer: Self-pay | Admitting: Sports Medicine

## 2019-06-30 DIAGNOSIS — M205X1 Other deformities of toe(s) (acquired), right foot: Secondary | ICD-10-CM

## 2019-06-30 DIAGNOSIS — Z9889 Other specified postprocedural states: Secondary | ICD-10-CM

## 2019-06-30 DIAGNOSIS — M2040 Other hammer toe(s) (acquired), unspecified foot: Secondary | ICD-10-CM

## 2019-06-30 DIAGNOSIS — M898X9 Other specified disorders of bone, unspecified site: Secondary | ICD-10-CM

## 2019-06-30 DIAGNOSIS — L570 Actinic keratosis: Secondary | ICD-10-CM

## 2019-06-30 DIAGNOSIS — M2061 Acquired deformities of toe(s), unspecified, right foot: Secondary | ICD-10-CM

## 2019-06-30 DIAGNOSIS — M216X9 Other acquired deformities of unspecified foot: Secondary | ICD-10-CM | POA: Diagnosis not present

## 2019-06-30 DIAGNOSIS — M6240 Contracture of muscle, unspecified site: Secondary | ICD-10-CM | POA: Diagnosis not present

## 2019-06-30 NOTE — Progress Notes (Addendum)
Subjective: Sonya Allen is a 63 y.o. female patient seen today in office for POV # 8 (DOS 02-28-19), S/P right Keller implant and IPJ fusion at the hallux with scar contracture release at the second MPJ.  Patient admits burning pain of the last few days worse at night and reports that her toes are still sticking up. Patient denies any new injury or trauma since last visit and feels like the pain is constant achy 3-10 out of 10 has been using ibuprofen toe splint but sometimes it hurts scar cream padding and stretching.  Patient denies headache, chest pain, shortness of breath, nausea, vomiting, fever, or chills. No other issues noted.   Patient Active Problem List   Diagnosis Date Noted  . Recurrent right knee instability 12/31/2017  . Acute pain of left shoulder 08/13/2017  . 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 Knee pain, right 01/23/2014  . 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  . Unilateral primary osteoarthritis, right knee 05/26/2013  . Other specified postprocedural states 01/31/2013    Current Outpatient Medications on File Prior to Visit  Medication Sig Dispense Refill  . betamethasone acetate-betamethasone sodium phosphate (CELESTONE) 6 (3-3) MG/ML injection Inject 12 mg into the articular space.    . bupivacaine, PF, (MARCAINE) 0.25 % SOLN injection Inject 25 mg into the skin.    . carisoprodol (SOMA) 350 MG tablet Take 350 mg by mouth 3 (three) times daily.    . carisoprodol (SOMA) 350 MG tablet     . clindamycin (CLEOCIN) 150 MG capsule Take 1 capsule (150 mg total) by mouth 2 (two) times daily. 14 capsule 0  . clonazePAM (KLONOPIN) 0.5 MG tablet Take 0.5 mg by mouth at bedtime. Restless legs    . clonazePAM (KLONOPIN) 0.5 MG tablet     . clonazePAM (KLONOPIN) 1 MG tablet     . conjugated estrogens (PREMARIN) vaginal cream     . diclofenac (CATAFLAM) 50 MG tablet     . diclofenac (VOLTAREN) 50 MG EC tablet     . diclofenac sodium (VOLTAREN) 1 % GEL     . docusate  sodium (COLACE) 100 MG capsule Take 1 capsule (100 mg total) by mouth 2 (two) times daily. 10 capsule 0  . enoxaparin (LOVENOX) 40 MG/0.4ML injection Inject 0.4 mLs (40 mg total) into the skin daily. 12 Syringe 0  . escitalopram (LEXAPRO) 10 MG tablet     . furosemide (LASIX) 20 MG tablet Take 20 mg by mouth daily.    . furosemide (LASIX) 20 MG tablet     . GRALISE 600 MG TABS     . HYDROmorphone HCl (EXALGO) 12 MG T24A SR tablet     . Hylan 48 MG/6ML SOSY Inject 48 mg into the articular space.    Marland Kitchen ibuprofen (ADVIL) 800 MG tablet Take 1 tablet (800 mg total) by mouth every 8 (eight) hours as needed. 30 tablet 0  . lamoTRIgine (LAMICTAL) 100 MG tablet     . Lifitegrast (XIIDRA) 5 % SOLN     . LINZESS 290 MCG CAPS capsule     . methocarbamol (ROBAXIN) 500 MG tablet Take 1-2 tablets (500-1,000 mg total) by mouth every 6 (six) hours as needed for muscle spasms. 60 tablet 2  . morphine (MS CONTIN) 30 MG 12 hr tablet Take by mouth.    . morphine (MS CONTIN) 60 MG 12 hr tablet Take 60 mg by mouth every 12 (twelve) hours.    Marland Kitchen MOVANTIK 12.5 MG TABS     .  NUVIGIL 150 MG tablet     . ondansetron (ZOFRAN) 4 MG tablet     . ondansetron (ZOFRAN) 4 MG tablet     . oxycodone (ROXICODONE) 30 MG immediate release tablet Take 1 tablet (30 mg total) by mouth every 8 (eight) hours as needed (for breakthrough pain). 100 tablet 0  . oxyCODONE-acetaminophen (PERCOCET) 10-325 MG tablet     . Polyethyl Glycol-Propyl Glycol (SYSTANE OP) Apply 1 drop to eye 2 (two) times daily as needed (dry eyes).    . pramipexole (MIRAPEX) 0.125 MG tablet     . pravastatin (PRAVACHOL) 40 MG tablet Take 40 mg by mouth daily.    . pravastatin (PRAVACHOL) 40 MG tablet Take by mouth.    . PredniSONE 10 MG KIT Take as instructed for 12 days and a tapering dose 48 each 0  . pregabalin (LYRICA) 100 MG capsule Take by mouth daily.  0  . pregabalin (LYRICA) 150 MG capsule Take 150 mg by mouth daily.    . promethazine (PHENERGAN) 25 MG  tablet Take 1 tablet (25 mg total) by mouth every 8 (eight) hours as needed for nausea or vomiting. 20 tablet 0  . Suvorexant (BELSOMRA) 15 MG TABS     . tiZANidine (ZANAFLEX) 4 MG tablet     . topiramate (TOPAMAX) 100 MG tablet Take 100 mg by mouth 2 (two) times daily.    Marland Kitchen topiramate (TOPAMAX) 50 MG tablet     . venlafaxine XR (EFFEXOR-XR) 150 MG 24 hr capsule Take 150 mg by mouth daily.    Marland Kitchen venlafaxine XR (EFFEXOR-XR) 150 MG 24 hr capsule     . Vitamin D, Ergocalciferol, (DRISDOL) 50000 units CAPS capsule     . ZETIA 10 MG tablet      Current Facility-Administered Medications on File Prior to Visit  Medication Dose Route Frequency Provider Last Rate Last Dose  . triamcinolone acetonide (KENALOG) 10 MG/ML injection 10 mg  10 mg Other Once Landis Martins, DPM      . triamcinolone acetonide (KENALOG) 10 MG/ML injection 10 mg  10 mg Other Once Landis Martins, DPM      . triamcinolone acetonide (KENALOG) 10 MG/ML injection 10 mg  10 mg Other Once Landis Martins, DPM        Allergies  Allergen Reactions  . Pravastatin Sodium Other (See Comments)    Muscles  aches Muscles  aches   . Augmentin [Amoxicillin-Pot Clavulanate] Diarrhea   Past Surgical History:  Procedure Laterality Date  . ABDOMINAL HYSTERECTOMY    . ANKLE FUSION Right   . BACK SURGERY     x 6 lumbar & cervical  . CARPAL TUNNEL RELEASE Bilateral   . KNEE ARTHROSCOPY Bilateral   . ROTATOR CUFF REPAIR Right   . TOTAL KNEE ARTHROPLASTY Left 07/25/2013   Procedure: TOTAL KNEE ARTHROPLASTY;  Surgeon: Vickey Huger, MD;  Location: [facility];  Service: Orthopedics;  Laterality: Left;    Social History   Socioeconomic History  . Marital status: Married    Spouse name: Not on file  . Number of children: Not on file  . Years of education: Not on file  . Highest education level: Not on file  Occupational History  . Not on file  Social Needs  . Financial resource strain: Not on file  . Food insecurity    Worry: Not on file     Inability: Not on file  . Transportation needs    Medical: Not on file    Non-medical: Not  on file  Tobacco Use  . Smoking status: Current Some Day Smoker    Packs/day: 0.50    Years: 29.00    Pack years: 14.50    Types: Cigarettes  . Smokeless tobacco: Never Used  Substance and Sexual Activity  . Alcohol use: No  . Drug use: Yes    Types: Morphine, Oxycodone  . Sexual activity: Not on file  Lifestyle  . Physical activity    Days per week: Not on file    Minutes per session: Not on file  . Stress: Not on file  Relationships  . Social Herbalist on phone: Not on file    Gets together: Not on file    Attends religious service: Not on file    Active member of club or organization: Not on file    Attends meetings of clubs or organizations: Not on file    Relationship status: Not on file  Other Topics Concern  . Not on file  Social History Narrative  . Not on file    No family history on file.  Objective: There were no vitals filed for this visit.  General: No acute distress, AAOx3  Right foot: Surgical site well healed. +callus sub met 1, mild swelling to right forefoot with noticeable medial bone spur, no erythema, no warmth, no drainage, no signs of infection noted, Capillary fill time <3 seconds in all digits, gross sensation present via light touch to right foot. No pain or crepitation with range of motion right foot, mild limited PF but able to be obtained with manipulation.  Fifth hammertoe deformity.  + Weightbearing elevation 1st toe that is limited by marked ankle deformity that is sitting is a varus position putting influence on the foot with the entire medial column pronated like before. No pain with calf compression.   X-rays consistent with postoperative status with hallux elevation  Assessment and Plan:  Problem List Items Addressed This Visit    None    Visit Diagnoses    Hallux extensus, acquired, right    -  Primary   Contracture of  tendon sheath       S/P foot surgery, right       Keratosis       Prominent metatarsal head, unspecified laterality       Acquired deformity of right toe       Bony exostosis       Hammer toe, unspecified laterality         -Patient seen and evaluated -Discussed with patient hallux extensus at right great toe with exostosis and hammertoe -Patient opt for surgical management. Consent obtained for right first toe fusion we will also have to remove Silastic implant at the area as well as exostectomy midfoot on right and right fifth hammertoe repair Pre and Post op course explained. Risks, benefits, alternatives explained. No guarantees given or implied. Surgical booking slip submitted and provided patient with Surgical packet and info for [place]. -Patient to bring cam boot that she already has and expect protected weightbearing with boot and walker/cane after surgery -Advised patient meanwhile to continue with good supportive shoes rest ice elevation scar creams ibuprofen and toe splint and advised patient to take her Lyrica at bedtime to help with burning pain -Patient to return to office after surgery or sooner if problems or issues arise. Landis Martins, DPM

## 2019-06-30 NOTE — Patient Instructions (Signed)
Pre-Operative Instructions  Congratulations, you have decided to take an important step towards improving your quality of life.  You can be assured that the doctors and staff at [facility] will be with you every step of the way.  Here are some important things you should know:  1. Plan to be at the surgery center/hospital at least 1 (one) hour prior to your scheduled time, unless otherwise directed by the surgical center/hospital staff.  You must have a responsible adult accompany you, remain during the surgery and drive you home.  Make sure you have directions to the surgical center/hospital to ensure you arrive on time. 2. If you are having surgery at [facility] or [facility], you will need a copy of your medical history and physical form from your family physician within one month prior to the date of surgery. We will give you a form for your primary physician to complete.  3. We make every effort to accommodate the date you request for surgery.  However, there are times where surgery dates or times have to be moved.  We will contact you as soon as possible if a change in schedule is required.   4. No aspirin/ibuprofen for one week before surgery.  If you are on aspirin, any non-steroidal anti-inflammatory medications (Mobic, Aleve, Ibuprofen) should not be taken seven (7) days prior to your surgery.  You make take Tylenol for pain prior to surgery.  5. Medications - If you are taking daily heart and blood pressure medications, seizure, reflux, allergy, asthma, anxiety, pain or diabetes medications, make sure you notify the surgery center/hospital before the day of surgery so they can tell you which medications you should take or avoid the day of surgery. 6. No food or drink after midnight the night before surgery unless directed otherwise by surgical center/hospital staff. 7. No alcoholic beverages 24-hours prior to surgery.  No smoking 24-hours prior or 24-hours after  surgery. 8. Wear loose pants or shorts. They should be loose enough to fit over bandages, boots, and casts. 9. Don't wear slip-on shoes. Sneakers are preferred. 10. Bring your boot with you to the surgery center/hospital.  Also bring crutches or a walker if your physician has prescribed it for you.  If you do not have this equipment, it will be provided for you after surgery. 11. If you have not been contacted by the surgery center/hospital by the day before your surgery, call to confirm the date and time of your surgery. 12. Leave-time from work may vary depending on the type of surgery you have.  Appropriate arrangements should be made prior to surgery with your employer. 13. Prescriptions will be provided immediately following surgery by your doctor.  Fill these as soon as possible after surgery and take the medication as directed. Pain medications will not be refilled on weekends and must be approved by the doctor. 14. Remove nail polish on the operative foot and avoid getting pedicures prior to surgery. 15. Wash the night before surgery.  The night before surgery wash the foot and leg well with water and the antibacterial soap provided. Be sure to pay special attention to beneath the toenails and in between the toes.  Wash for at least three (3) minutes. Rinse thoroughly with water and dry well with a towel.  Perform this wash unless told not to do so by your physician.  Enclosed: 1 Ice pack (please put in freezer the night before surgery)   1 Hibiclens skin cleaner     Pre-op instructions  If you have any questions regarding the instructions, please do not hesitate to call our office.  [place]: [address], [place], [place] [address] -- [phone]  [place]: [address] [place], [place] [address] -- [phone]  [place]: [address]  [place], [place] [address] -- [phone]   Website: [email or web address] 

## 2019-07-07 ENCOUNTER — Telehealth: Payer: Self-pay | Admitting: *Deleted

## 2019-07-07 NOTE — Telephone Encounter (Signed)
"  I'm calling to schedule my surgery."  Do you have a date that you like?  Dr. Cannon Kettle does surgeries on Mondays.  "Can she do it on November 23 or 30?"  She can do it on August 01, 2019.  "That date will be fine."  Someone from the surgical center will probably give you a call the Friday before that date and will give you your arrival time.  You need to go online and register with the surgical center, if you have access to a computer.  If you do not, someone from the surgical center will call you to get the needed information.

## 2019-07-07 NOTE — Telephone Encounter (Signed)
I attempted to return her call.  I left her a message to call me back on her home voicemail.  He mobile number had calling restrictions on it so I could not leave a message.

## 2019-07-07 NOTE — Telephone Encounter (Signed)
"  Dr. Cannon Kettle is going to do surgery on my foot.  She wanted me to call you to see when you could schedule the surgery.  Could you give me a call back?  Call me on my home phone.  I would appreciate it."

## 2019-07-12 DIAGNOSIS — G894 Chronic pain syndrome: Secondary | ICD-10-CM | POA: Diagnosis not present

## 2019-07-12 DIAGNOSIS — M79673 Pain in unspecified foot: Secondary | ICD-10-CM | POA: Diagnosis not present

## 2019-07-12 DIAGNOSIS — M961 Postlaminectomy syndrome, not elsewhere classified: Secondary | ICD-10-CM | POA: Diagnosis not present

## 2019-07-12 DIAGNOSIS — M1710 Unilateral primary osteoarthritis, unspecified knee: Secondary | ICD-10-CM | POA: Diagnosis not present

## 2019-07-12 DIAGNOSIS — Z79899 Other long term (current) drug therapy: Secondary | ICD-10-CM | POA: Diagnosis not present

## 2019-07-12 DIAGNOSIS — Z79891 Long term (current) use of opiate analgesic: Secondary | ICD-10-CM | POA: Diagnosis not present

## 2019-07-18 ENCOUNTER — Telehealth: Payer: Self-pay | Admitting: *Deleted

## 2019-07-18 NOTE — Telephone Encounter (Signed)
DOS 08/01/2019 REMOVAL FIXATION DEEP KWIRE/SCREW 1ST - 20680, TARSAL EXOSTECTOMY 1ST - 28104, HAMMER TOE REPAIR 5TH - 28285, AND HALLUX MPJ FUSION - [phone] RIGHT FOOT  [employer]: Eligibility Date - 09/25/2018 - 08/25/2019  Member's plan does not have a deductible.  Out-of-Pocket Maximum Per Service Year $1,016.21 of $4,500.00 Met Remaining: $3,483.79  Co-pay $225 / Day OUTPATIENT SURGERY  Co-Insurance 0% / Day OUTPATIENT SURGERY   This [employer]vant[employer] members plan does not currently require a prior authorization for these services. If you have general questions about the prior authorization requirements, please call us at [phone] or visit [email or web address] > Clinician Resources > Advance and Admission Notification Requirements. The number above acknowledges your notification. Please write this number down for future reference. Notification is not a guarantee of coverage or payment.  Decision ID #:[id number]

## 2019-07-25 DIAGNOSIS — E785 Hyperlipidemia, unspecified: Secondary | ICD-10-CM | POA: Diagnosis not present

## 2019-07-25 DIAGNOSIS — G4700 Insomnia, unspecified: Secondary | ICD-10-CM | POA: Diagnosis not present

## 2019-07-25 DIAGNOSIS — K5900 Constipation, unspecified: Secondary | ICD-10-CM | POA: Diagnosis not present

## 2019-07-28 DIAGNOSIS — E785 Hyperlipidemia, unspecified: Secondary | ICD-10-CM | POA: Diagnosis not present

## 2019-07-28 DIAGNOSIS — Z79899 Other long term (current) drug therapy: Secondary | ICD-10-CM | POA: Diagnosis not present

## 2019-07-31 ENCOUNTER — Other Ambulatory Visit: Payer: Self-pay | Admitting: Sports Medicine

## 2019-07-31 DIAGNOSIS — G8918 Other acute postprocedural pain: Secondary | ICD-10-CM

## 2019-07-31 NOTE — Progress Notes (Signed)
Post op meds entered 

## 2019-08-01 ENCOUNTER — Encounter: Payer: Self-pay | Admitting: Sports Medicine

## 2019-08-01 DIAGNOSIS — M25774 Osteophyte, right foot: Secondary | ICD-10-CM | POA: Diagnosis not present

## 2019-08-01 DIAGNOSIS — E7800 Pure hypercholesterolemia, unspecified: Secondary | ICD-10-CM | POA: Diagnosis not present

## 2019-08-01 DIAGNOSIS — M898X7 Other specified disorders of bone, ankle and foot: Secondary | ICD-10-CM | POA: Diagnosis not present

## 2019-08-01 DIAGNOSIS — Z4889 Encounter for other specified surgical aftercare: Secondary | ICD-10-CM | POA: Diagnosis not present

## 2019-08-01 DIAGNOSIS — M2021 Hallux rigidus, right foot: Secondary | ICD-10-CM | POA: Diagnosis not present

## 2019-08-01 DIAGNOSIS — M2041 Other hammer toe(s) (acquired), right foot: Secondary | ICD-10-CM | POA: Diagnosis not present

## 2019-08-01 DIAGNOSIS — M2570 Osteophyte, unspecified joint: Secondary | ICD-10-CM | POA: Diagnosis not present

## 2019-08-01 DIAGNOSIS — M25571 Pain in right ankle and joints of right foot: Secondary | ICD-10-CM | POA: Diagnosis not present

## 2019-08-01 MED ORDER — DOCUSATE SODIUM 100 MG PO CAPS: 100.0000 mg | ORAL_CAPSULE | Freq: Two times a day (BID) | ORAL | 0 refills | Status: DC

## 2019-08-01 MED ORDER — PROMETHAZINE HCL 25 MG PO TABS: 25.0000 mg | ORAL_TABLET | Freq: Three times a day (TID) | ORAL | 0 refills | Status: DC | PRN

## 2019-08-01 MED ORDER — IBUPROFEN 800 MG PO TABS: 800.0000 mg | ORAL_TABLET | Freq: Three times a day (TID) | ORAL | 0 refills | Status: DC | PRN

## 2019-08-02 ENCOUNTER — Telehealth: Payer: Self-pay | Admitting: Sports Medicine

## 2019-08-02 NOTE — Telephone Encounter (Signed)
Post op check phone call made to patient. Patient reports that she is doing good. I reminded patient to continue with NWB and may take boot off when sleeping. Advised patient to call office if she has any other ?s or concerns. Dr. Cannon Kettle

## 2019-08-03 ENCOUNTER — Ambulatory Visit: Payer: Medicare Other

## 2019-08-03 ENCOUNTER — Other Ambulatory Visit: Payer: Self-pay

## 2019-08-12 ENCOUNTER — Ambulatory Visit (INDEPENDENT_AMBULATORY_CARE_PROVIDER_SITE_OTHER): Payer: Self-pay | Admitting: Sports Medicine

## 2019-08-12 ENCOUNTER — Encounter: Payer: Self-pay | Admitting: Sports Medicine

## 2019-08-12 ENCOUNTER — Other Ambulatory Visit: Payer: Self-pay | Admitting: Sports Medicine

## 2019-08-12 ENCOUNTER — Other Ambulatory Visit: Payer: Self-pay

## 2019-08-12 ENCOUNTER — Ambulatory Visit (INDEPENDENT_AMBULATORY_CARE_PROVIDER_SITE_OTHER): Payer: Medicare Other

## 2019-08-12 DIAGNOSIS — Z9889 Other specified postprocedural states: Secondary | ICD-10-CM

## 2019-08-12 DIAGNOSIS — M6240 Contracture of muscle, unspecified site: Secondary | ICD-10-CM

## 2019-08-12 DIAGNOSIS — M205X2 Other deformities of toe(s) (acquired), left foot: Secondary | ICD-10-CM | POA: Diagnosis not present

## 2019-08-12 DIAGNOSIS — M205X1 Other deformities of toe(s) (acquired), right foot: Secondary | ICD-10-CM

## 2019-08-12 DIAGNOSIS — G8918 Other acute postprocedural pain: Secondary | ICD-10-CM

## 2019-08-12 MED ORDER — DOCUSATE SODIUM 100 MG PO CAPS: 100.0000 mg | ORAL_CAPSULE | Freq: Two times a day (BID) | ORAL | 0 refills | Status: DC

## 2019-08-12 NOTE — Progress Notes (Signed)
Subjective: Sonya Allen is a 63 y.o. female patient seen today in office for POV #1 (DOS 08/01/2019), S/P right first metatarsophalangeal joint fusion. Patient denies pain at surgical site currently but reports when she is lying in bed there is some pain to the surgical site, patient denies calf pain, denies headache, chest pain, shortness of breath, nausea, vomiting, fever, or chills.. No other issues noted.   Patient Active Problem List   Diagnosis Date Noted  . Recurrent right knee instability 12/31/2017  . Acute pain of left shoulder 08/13/2017  . 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 Knee pain, right 01/23/2014  . 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  . Unilateral primary osteoarthritis, right knee 05/26/2013  . Other specified postprocedural states 01/31/2013    Current Outpatient Medications on File Prior to Visit  Medication Sig Dispense Refill  . betamethasone acetate-betamethasone sodium phosphate (CELESTONE) 6 (3-3) MG/ML injection Inject 12 mg into the articular space.    . bupivacaine, PF, (MARCAINE) 0.25 % SOLN injection Inject 25 mg into the skin.    . carisoprodol (SOMA) 350 MG tablet Take 350 mg by mouth 3 (three) times daily.    . carisoprodol (SOMA) 350 MG tablet     . clindamycin (CLEOCIN) 150 MG capsule Take 1 capsule (150 mg total) by mouth 2 (two) times daily. 14 capsule 0  . clonazePAM (KLONOPIN) 0.5 MG tablet Take 0.5 mg by mouth at bedtime. Restless legs    . clonazePAM (KLONOPIN) 0.5 MG tablet     . clonazePAM (KLONOPIN) 1 MG tablet     . conjugated estrogens (PREMARIN) vaginal cream     . diclofenac (CATAFLAM) 50 MG tablet     . diclofenac (VOLTAREN) 50 MG EC tablet     . diclofenac sodium (VOLTAREN) 1 % GEL     . enoxaparin (LOVENOX) 40 MG/0.4ML injection Inject 0.4 mLs (40 mg total) into the skin daily. 12 Syringe 0  . escitalopram (LEXAPRO) 10 MG tablet     . furosemide (LASIX) 20 MG tablet Take 20 mg by mouth daily.    . furosemide (LASIX) 20  MG tablet     . GRALISE 600 MG TABS     . HYDROmorphone HCl (EXALGO) 12 MG T24A SR tablet     . Hylan 48 MG/6ML SOSY Inject 48 mg into the articular space.    Marland Kitchen ibuprofen (ADVIL) 800 MG tablet Take 1 tablet (800 mg total) by mouth every 8 (eight) hours as needed. 30 tablet 0  . lamoTRIgine (LAMICTAL) 100 MG tablet     . Lifitegrast (XIIDRA) 5 % SOLN     . LINZESS 290 MCG CAPS capsule     . methocarbamol (ROBAXIN) 500 MG tablet Take 1-2 tablets (500-1,000 mg total) by mouth every 6 (six) hours as needed for muscle spasms. 60 tablet 2  . morphine (MS CONTIN) 30 MG 12 hr tablet Take by mouth.    . morphine (MS CONTIN) 60 MG 12 hr tablet Take 60 mg by mouth every 12 (twelve) hours.    Marland Kitchen MOVANTIK 12.5 MG TABS     . NUVIGIL 150 MG tablet     . ondansetron (ZOFRAN) 4 MG tablet     . ondansetron (ZOFRAN) 4 MG tablet     . oxycodone (ROXICODONE) 30 MG immediate release tablet Take 1 tablet (30 mg total) by mouth every 8 (eight) hours as needed (for breakthrough pain). 100 tablet 0  . oxyCODONE-acetaminophen (PERCOCET) 10-325 MG tablet     .  Polyethyl Glycol-Propyl Glycol (SYSTANE OP) Apply 1 drop to eye 2 (two) times daily as needed (dry eyes).    . pramipexole (MIRAPEX) 0.125 MG tablet     . pravastatin (PRAVACHOL) 40 MG tablet Take 40 mg by mouth daily.    . pravastatin (PRAVACHOL) 40 MG tablet Take by mouth.    . PredniSONE 10 MG KIT Take as instructed for 12 days and a tapering dose 48 each 0  . pregabalin (LYRICA) 100 MG capsule Take by mouth daily.  0  . pregabalin (LYRICA) 150 MG capsule Take 150 mg by mouth daily.    . promethazine (PHENERGAN) 25 MG tablet Take 1 tablet (25 mg total) by mouth every 8 (eight) hours as needed for nausea or vomiting. 20 tablet 0  . Suvorexant (BELSOMRA) 15 MG TABS     . tiZANidine (ZANAFLEX) 4 MG tablet     . topiramate (TOPAMAX) 100 MG tablet Take 100 mg by mouth 2 (two) times daily.    Marland Kitchen topiramate (TOPAMAX) 50 MG tablet     . venlafaxine XR (EFFEXOR-XR)  150 MG 24 hr capsule Take 150 mg by mouth daily.    Marland Kitchen venlafaxine XR (EFFEXOR-XR) 150 MG 24 hr capsule     . Vitamin D, Ergocalciferol, (DRISDOL) 50000 units CAPS capsule     . ZETIA 10 MG tablet      Current Facility-Administered Medications on File Prior to Visit  Medication Dose Route Frequency Provider Last Rate Last Admin  . triamcinolone acetonide (KENALOG) 10 MG/ML injection 10 mg  10 mg Other Once Landis Martins, DPM      . triamcinolone acetonide (KENALOG) 10 MG/ML injection 10 mg  10 mg Other Once Watson, Govanni Plemons, DPM      . triamcinolone acetonide (KENALOG) 10 MG/ML injection 10 mg  10 mg Other Once Landis Martins, DPM        Allergies  Allergen Reactions  . Pravastatin Sodium Other (See Comments)    Muscles  aches Muscles  aches   . Augmentin [Amoxicillin-Pot Clavulanate] Diarrhea    Objective: There were no vitals filed for this visit.  General: No acute distress, AAOx3  Right foot: Sutures intact with no gapping or dehiscence at surgical site, mild swelling to right foot, no erythema, no warmth, no drainage, no signs of infection noted, Capillary fill time <3 seconds in all digits, gross sensation present via light touch to right foot.  Mild guarding with palpation range of motion to right foot no range of motion at first ray status post fusion, no pain with calf compression.   Post Op Xray, Right foot hardware intact soft tissue swelling within normal limits for postoperative status  Assessment and Plan:  Problem List Items Addressed This Visit    None    Visit Diagnoses    Hallux extensus, acquired, right    -  Primary   Post-op pain       Relevant Medications   docusate sodium (COLACE) 100 MG capsule   Contracture of tendon sheath       S/P foot surgery, right           -Patient seen and evaluated -X-rays reviewed hardware intact -Applied dry sterile dressing to surgical site right foot secured with ACE wrap and stockinet  -Patient to keep dressing  clean dry and intact -Advised patient to continue with cam boot or wedge postoperative shoe only put pressure to the heel noted to the forefoot -Advised patient to limit activity to necessity  -Advised  patient to ice and elevate as necessary -Refilled colace -Will plan for possible suture removal at next office visit. In the meantime, patient to call office if any issues or problems arise.   Landis Martins, DPM

## 2019-08-16 DIAGNOSIS — M79673 Pain in unspecified foot: Secondary | ICD-10-CM | POA: Diagnosis not present

## 2019-08-16 DIAGNOSIS — Z9889 Other specified postprocedural states: Secondary | ICD-10-CM | POA: Diagnosis not present

## 2019-08-16 DIAGNOSIS — G894 Chronic pain syndrome: Secondary | ICD-10-CM | POA: Diagnosis not present

## 2019-08-16 DIAGNOSIS — M961 Postlaminectomy syndrome, not elsewhere classified: Secondary | ICD-10-CM | POA: Diagnosis not present

## 2019-08-17 ENCOUNTER — Ambulatory Visit (INDEPENDENT_AMBULATORY_CARE_PROVIDER_SITE_OTHER): Payer: Medicare Other | Admitting: Sports Medicine

## 2019-08-17 ENCOUNTER — Other Ambulatory Visit: Payer: Self-pay | Admitting: Sports Medicine

## 2019-08-17 ENCOUNTER — Encounter: Payer: Self-pay | Admitting: Sports Medicine

## 2019-08-17 ENCOUNTER — Other Ambulatory Visit: Payer: Self-pay

## 2019-08-17 DIAGNOSIS — M6240 Contracture of muscle, unspecified site: Secondary | ICD-10-CM

## 2019-08-17 DIAGNOSIS — Z9889 Other specified postprocedural states: Secondary | ICD-10-CM

## 2019-08-17 DIAGNOSIS — T8130XA Disruption of wound, unspecified, initial encounter: Secondary | ICD-10-CM

## 2019-08-17 DIAGNOSIS — G8918 Other acute postprocedural pain: Secondary | ICD-10-CM

## 2019-08-17 DIAGNOSIS — M205X1 Other deformities of toe(s) (acquired), right foot: Secondary | ICD-10-CM

## 2019-08-17 DIAGNOSIS — T84498A Other mechanical complication of other internal orthopedic devices, implants and grafts, initial encounter: Secondary | ICD-10-CM

## 2019-08-17 MED ORDER — SULFAMETHOXAZOLE-TRIMETHOPRIM 400-80 MG PO TABS: 1.0000 | ORAL_TABLET | Freq: Two times a day (BID) | ORAL | 0 refills | Status: DC

## 2019-08-17 NOTE — Progress Notes (Signed)
Subjective: Sonya Allen is a 63 y.o. female patient seen today in office for POV #2 (DOS 08/01/2019), S/P right first metatarsophalangeal joint fusion. Patient admits pain at surgical site 3-4 out of 10 with increased swelling reports that on Saturday had some bleeding about Monday with the dressing she had to change the dressing 3 times reports the blood came through reports that it looks like it is opening up and it does not look good to her, patient denies calf pain, denies headache, chest pain, shortness of breath, nausea, vomiting, fever, or chills.. No other issues noted.   Patient Active Problem List   Diagnosis Date Noted  . Recurrent right knee instability 12/31/2017  . Acute pain of left shoulder 08/13/2017  . 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 Knee pain, right 01/23/2014  . 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  . Unilateral primary osteoarthritis, right knee 05/26/2013  . Other specified postprocedural states 01/31/2013    Current Outpatient Medications on File Prior to Visit  Medication Sig Dispense Refill  . betamethasone acetate-betamethasone sodium phosphate (CELESTONE) 6 (3-3) MG/ML injection Inject 12 mg into the articular space.    . bupivacaine, PF, (MARCAINE) 0.25 % SOLN injection Inject 25 mg into the skin.    . carisoprodol (SOMA) 350 MG tablet Take 350 mg by mouth 3 (three) times daily.    . carisoprodol (SOMA) 350 MG tablet     . clindamycin (CLEOCIN) 150 MG capsule Take 1 capsule (150 mg total) by mouth 2 (two) times daily. 14 capsule 0  . clonazePAM (KLONOPIN) 0.5 MG tablet Take 0.5 mg by mouth at bedtime. Restless legs    . clonazePAM (KLONOPIN) 0.5 MG tablet     . clonazePAM (KLONOPIN) 1 MG tablet     . conjugated estrogens (PREMARIN) vaginal cream     . diclofenac (CATAFLAM) 50 MG tablet     . diclofenac (VOLTAREN) 50 MG EC tablet     . diclofenac sodium (VOLTAREN) 1 % GEL     . docusate sodium (COLACE) 100 MG capsule Take 1 capsule (100 mg  total) by mouth 2 (two) times daily. 10 capsule 0  . enoxaparin (LOVENOX) 40 MG/0.4ML injection Inject 0.4 mLs (40 mg total) into the skin daily. 12 Syringe 0  . escitalopram (LEXAPRO) 10 MG tablet     . furosemide (LASIX) 20 MG tablet Take 20 mg by mouth daily.    . furosemide (LASIX) 20 MG tablet     . GRALISE 600 MG TABS     . HYDROmorphone HCl (EXALGO) 12 MG T24A SR tablet     . Hylan 48 MG/6ML SOSY Inject 48 mg into the articular space.    Marland Kitchen ibuprofen (ADVIL) 800 MG tablet Take 1 tablet (800 mg total) by mouth every 8 (eight) hours as needed. 30 tablet 0  . lamoTRIgine (LAMICTAL) 100 MG tablet     . Lifitegrast (XIIDRA) 5 % SOLN     . LINZESS 290 MCG CAPS capsule     . methocarbamol (ROBAXIN) 500 MG tablet Take 1-2 tablets (500-1,000 mg total) by mouth every 6 (six) hours as needed for muscle spasms. 60 tablet 2  . morphine (MS CONTIN) 30 MG 12 hr tablet Take by mouth.    . morphine (MS CONTIN) 60 MG 12 hr tablet Take 60 mg by mouth every 12 (twelve) hours.    Marland Kitchen MOVANTIK 12.5 MG TABS     . NUVIGIL 150 MG tablet     . ondansetron (ZOFRAN)  4 MG tablet     . ondansetron (ZOFRAN) 4 MG tablet     . oxycodone (ROXICODONE) 30 MG immediate release tablet Take 1 tablet (30 mg total) by mouth every 8 (eight) hours as needed (for breakthrough pain). 100 tablet 0  . oxyCODONE-acetaminophen (PERCOCET) 10-325 MG tablet     . Polyethyl Glycol-Propyl Glycol (SYSTANE OP) Apply 1 drop to eye 2 (two) times daily as needed (dry eyes).    . pramipexole (MIRAPEX) 0.125 MG tablet     . pravastatin (PRAVACHOL) 40 MG tablet Take 40 mg by mouth daily.    . pravastatin (PRAVACHOL) 40 MG tablet Take by mouth.    . PredniSONE 10 MG KIT Take as instructed for 12 days and a tapering dose 48 each 0  . pregabalin (LYRICA) 100 MG capsule Take by mouth daily.  0  . pregabalin (LYRICA) 150 MG capsule Take 150 mg by mouth daily.    . promethazine (PHENERGAN) 25 MG tablet Take 1 tablet (25 mg total) by mouth every 8  (eight) hours as needed for nausea or vomiting. 20 tablet 0  . Suvorexant (BELSOMRA) 15 MG TABS     . tiZANidine (ZANAFLEX) 4 MG tablet     . topiramate (TOPAMAX) 100 MG tablet Take 100 mg by mouth 2 (two) times daily.    Marland Kitchen topiramate (TOPAMAX) 50 MG tablet     . venlafaxine XR (EFFEXOR-XR) 150 MG 24 hr capsule Take 150 mg by mouth daily.    Marland Kitchen venlafaxine XR (EFFEXOR-XR) 150 MG 24 hr capsule     . Vitamin D, Ergocalciferol, (DRISDOL) 50000 units CAPS capsule     . ZETIA 10 MG tablet      Current Facility-Administered Medications on File Prior to Visit  Medication Dose Route Frequency Provider Last Rate Last Admin  . triamcinolone acetonide (KENALOG) 10 MG/ML injection 10 mg  10 mg Other Once Landis Martins, DPM      . triamcinolone acetonide (KENALOG) 10 MG/ML injection 10 mg  10 mg Other Once [place], Anzley Dibbern, DPM      . triamcinolone acetonide (KENALOG) 10 MG/ML injection 10 mg  10 mg Other Once Landis Martins, DPM        Allergies  Allergen Reactions  . Pravastatin Sodium Other (See Comments)    Muscles  aches Muscles  aches   . Augmentin [Amoxicillin-Pot Clavulanate] Diarrhea    Objective: There were no vitals filed for this visit.  General: No acute distress, AAOx3  Right foot:+ dehiscence at surgical site with distal third of the dorsal plate over the first toe exposed, mild surrounding erythema, mild swelling to right foot, no warmth, minimal bloody drainage, no other signs of infection noted, Capillary fill time <3 seconds in all digits, gross sensation present via light touch to right foot.  Mild guarding with palpation range of motion to right foot no range of motion at first ray status post fusion, no pain with calf compression.   Assessment and Plan:  Problem List Items Addressed This Visit    None    Visit Diagnoses    Exposed orthopaedic hardware ([place])    -  Primary   Post-op pain       Hallux extensus, acquired, right       Contracture of tendon sheath        S/P foot surgery, right       Wound dehiscence          -Patient seen and evaluated -Discussed with patient care  for wound dehiscence with exposed hardware advised patient at this time because the screws are holding do not need to take out the plate plan for a removal procedure however did strongly advised patient to stay off foot because her incision was well coapted last visit and all of a sudden has burst and I think this is due to the patient either increasing her activity or doing more walking and standing.  Patient reports that she did have to go for her pain management appointment on Friday and then afterwards that is when she had problems with bleeding from the surgical site. -Cleanse the area with saline then PRISMA was applied covered with dry dressing.  Advised the patient to do the same replacing the Prisma as needed -Advised patient to continue with cam boot or wedge postoperative shoe only put pressure to the heel NO PRESSURE TO FOREFOOT.  -Prescribed Bactrim antibiotic for patient to take as instructed -Advised patient to limit activity to necessity  -Advised patient to ice and elevate as necessary -Will plan for xrays and evaluation of exposed hardware at next office visit. In the meantime, patient to call office if any issues or problems arise.  Advised patient that she is at risk of hardware failure if she does not comply with staying off the foot or even infection that could compromise the toe.  Landis Martins, DPM

## 2019-08-25 ENCOUNTER — Telehealth: Payer: Self-pay | Admitting: Urology

## 2019-08-25 NOTE — Telephone Encounter (Signed)
Pt called and said that her foot is still draining. On the left side of the plate it is getting loose. Pt wanted to make you aware and see if you wanted to see her sooner than her next appointment on 1/14 @ 1:15pm. Please Advise. Thanks!

## 2019-08-27 NOTE — Telephone Encounter (Signed)
Lets get her in ASAP to see me this week, May have to take her to OR to remove the plate

## 2019-08-29 NOTE — Telephone Encounter (Signed)
430 on 1/6

## 2019-08-29 NOTE — Telephone Encounter (Signed)
OK. Ill call her and get her scheduled

## 2019-08-29 NOTE — Telephone Encounter (Signed)
You have no opening this week where would you like for me to put her? Ill have to get someone to open something up. Thanks

## 2019-08-31 ENCOUNTER — Other Ambulatory Visit: Payer: Self-pay

## 2019-08-31 ENCOUNTER — Ambulatory Visit (INDEPENDENT_AMBULATORY_CARE_PROVIDER_SITE_OTHER): Payer: Medicare Other | Admitting: Sports Medicine

## 2019-08-31 ENCOUNTER — Ambulatory Visit (INDEPENDENT_AMBULATORY_CARE_PROVIDER_SITE_OTHER): Payer: Medicare Other

## 2019-08-31 ENCOUNTER — Encounter: Payer: Self-pay | Admitting: Sports Medicine

## 2019-08-31 DIAGNOSIS — M205X1 Other deformities of toe(s) (acquired), right foot: Secondary | ICD-10-CM

## 2019-08-31 DIAGNOSIS — T84498A Other mechanical complication of other internal orthopedic devices, implants and grafts, initial encounter: Secondary | ICD-10-CM | POA: Diagnosis not present

## 2019-08-31 DIAGNOSIS — G8918 Other acute postprocedural pain: Secondary | ICD-10-CM

## 2019-08-31 DIAGNOSIS — Z9889 Other specified postprocedural states: Secondary | ICD-10-CM

## 2019-08-31 DIAGNOSIS — T8130XA Disruption of wound, unspecified, initial encounter: Secondary | ICD-10-CM

## 2019-08-31 MED ORDER — SULFAMETHOXAZOLE-TRIMETHOPRIM 400-80 MG PO TABS: 1.0000 | ORAL_TABLET | Freq: Two times a day (BID) | ORAL | 0 refills | Status: DC

## 2019-08-31 NOTE — Progress Notes (Signed)
Subjective: Sonya Allen is a 64 y.o. female patient seen today in office for POV #3 (DOS 08/01/2019), S/P right first metatarsophalangeal joint fusion. Patient admits pain at surgical site 4 out of 10 with increased drainage and reports that more of the plate is exposed now, patient denies calf pain, denies headache, chest pain, shortness of breath, nausea, vomiting, fever, or chills. No other issues noted.   Patient Active Problem List   Diagnosis Date Noted  . Recurrent right knee instability 12/31/2017  . Acute pain of left shoulder 08/13/2017  . 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 Knee pain, right 01/23/2014  . 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  . Unilateral primary osteoarthritis, right knee 05/26/2013  . Other specified postprocedural states 01/31/2013    Current Outpatient Medications on File Prior to Visit  Medication Sig Dispense Refill  . betamethasone acetate-betamethasone sodium phosphate (CELESTONE) 6 (3-3) MG/ML injection Inject 12 mg into the articular space.    . bupivacaine, PF, (MARCAINE) 0.25 % SOLN injection Inject 25 mg into the skin.    . carisoprodol (SOMA) 350 MG tablet Take 350 mg by mouth 3 (three) times daily.    . carisoprodol (SOMA) 350 MG tablet     . clindamycin (CLEOCIN) 150 MG capsule Take 1 capsule (150 mg total) by mouth 2 (two) times daily. 14 capsule 0  . clonazePAM (KLONOPIN) 0.5 MG tablet Take 0.5 mg by mouth at bedtime. Restless legs    . clonazePAM (KLONOPIN) 0.5 MG tablet     . clonazePAM (KLONOPIN) 1 MG tablet     . conjugated estrogens (PREMARIN) vaginal cream     . diclofenac (CATAFLAM) 50 MG tablet     . diclofenac (VOLTAREN) 50 MG EC tablet     . diclofenac sodium (VOLTAREN) 1 % GEL     . docusate sodium (COLACE) 100 MG capsule Take 1 capsule (100 mg total) by mouth 2 (two) times daily. 10 capsule 0  . enoxaparin (LOVENOX) 40 MG/0.4ML injection Inject 0.4 mLs (40 mg total) into the skin daily. 12 Syringe 0  . escitalopram  (LEXAPRO) 10 MG tablet     . furosemide (LASIX) 20 MG tablet Take 20 mg by mouth daily.    . furosemide (LASIX) 20 MG tablet     . GRALISE 600 MG TABS     . HYDROmorphone HCl (EXALGO) 12 MG T24A SR tablet     . Hylan 48 MG/6ML SOSY Inject 48 mg into the articular space.    Marland Kitchen ibuprofen (ADVIL) 800 MG tablet Take 1 tablet (800 mg total) by mouth every 8 (eight) hours as needed. 30 tablet 0  . lamoTRIgine (LAMICTAL) 100 MG tablet     . Lifitegrast (XIIDRA) 5 % SOLN     . LINZESS 290 MCG CAPS capsule     . methocarbamol (ROBAXIN) 500 MG tablet Take 1-2 tablets (500-1,000 mg total) by mouth every 6 (six) hours as needed for muscle spasms. 60 tablet 2  . morphine (MS CONTIN) 30 MG 12 hr tablet Take by mouth.    . morphine (MS CONTIN) 60 MG 12 hr tablet Take 60 mg by mouth every 12 (twelve) hours.    Marland Kitchen MOVANTIK 12.5 MG TABS     . NUVIGIL 150 MG tablet     . ondansetron (ZOFRAN) 4 MG tablet     . ondansetron (ZOFRAN) 4 MG tablet     . oxycodone (ROXICODONE) 30 MG immediate release tablet Take 1 tablet (30 mg total) by  mouth every 8 (eight) hours as needed (for breakthrough pain). 100 tablet 0  . oxyCODONE-acetaminophen (PERCOCET) 10-325 MG tablet     . Polyethyl Glycol-Propyl Glycol (SYSTANE OP) Apply 1 drop to eye 2 (two) times daily as needed (dry eyes).    . pramipexole (MIRAPEX) 0.125 MG tablet     . pravastatin (PRAVACHOL) 40 MG tablet Take 40 mg by mouth daily.    . pravastatin (PRAVACHOL) 40 MG tablet Take by mouth.    . PredniSONE 10 MG KIT Take as instructed for 12 days and a tapering dose 48 each 0  . pregabalin (LYRICA) 100 MG capsule Take by mouth daily.  0  . pregabalin (LYRICA) 150 MG capsule Take 150 mg by mouth daily.    . promethazine (PHENERGAN) 25 MG tablet Take 1 tablet (25 mg total) by mouth every 8 (eight) hours as needed for nausea or vomiting. 20 tablet 0  . Suvorexant (BELSOMRA) 15 MG TABS     . tiZANidine (ZANAFLEX) 4 MG tablet     . topiramate (TOPAMAX) 100 MG tablet  Take 100 mg by mouth 2 (two) times daily.    Marland Kitchen topiramate (TOPAMAX) 50 MG tablet     . venlafaxine XR (EFFEXOR-XR) 150 MG 24 hr capsule Take 150 mg by mouth daily.    Marland Kitchen venlafaxine XR (EFFEXOR-XR) 150 MG 24 hr capsule     . Vitamin D, Ergocalciferol, (DRISDOL) 50000 units CAPS capsule     . ZETIA 10 MG tablet      Current Facility-Administered Medications on File Prior to Visit  Medication Dose Route Frequency Provider Last Rate Last Admin  . triamcinolone acetonide (KENALOG) 10 MG/ML injection 10 mg  10 mg Other Once Landis Martins, DPM      . triamcinolone acetonide (KENALOG) 10 MG/ML injection 10 mg  10 mg Other Once [place], Kaylenn Civil, DPM      . triamcinolone acetonide (KENALOG) 10 MG/ML injection 10 mg  10 mg Other Once Landis Martins, DPM        Allergies  Allergen Reactions  . Pravastatin Sodium Other (See Comments)    Muscles  aches Muscles  aches   . Augmentin [Amoxicillin-Pot Clavulanate] Diarrhea    Objective: There were no vitals filed for this visit.  General: No acute distress, AAOx3  Right foot:+ dehiscence at surgical site with now complete dorsal plate over the first toe exposed, mild surrounding erythema, mild swelling to right foot, no warmth, minimal bloody drainage, no other signs of infection noted, Capillary fill time <3 seconds in all digits, gross sensation present via light touch to right foot.  Mild guarding with palpation range of motion to right foot no range of motion at first ray status post fusion, no pain with calf compression.   Assessment and Plan:  Problem List Items Addressed This Visit    None    Visit Diagnoses    Hallux extensus, acquired, right    -  Primary   Relevant Orders   DG Foot Complete Right   Post-op pain       Relevant Orders   DG Foot Complete Right   Exposed orthopaedic hardware ([place])       Wound dehiscence       S/P foot surgery, right          -Patient seen and evaluated -X-rays reviewed consistent with loosening  of screws or hardware -Discussed with patient care for wound dehiscence with exposed hardware advised patient since things are loosening and more progressive  with more plate exposure it is best to remove the hardware -Applied dry dressing to the surgical site and advised patient to change daily -Patient opt for surgical management. Consent obtained for removal of hardware at right first ray and placement of antibiotic beads and possible graft.  Pre and Post op course explained. Risks, benefits, alternatives explained. No guarantees given or implied. Surgical booking slip submitted and provided patient with Surgical packet and info for [place] -Patient to be nonweightbearing -Refilled Bactrim antibiotic for patient to take as instructed -Advised patient to limit activity to necessity  -Advised patient to ice and elevate as necessary -Return to office after surgery or sooner if problems or issues arise.  Landis Martins, DPM

## 2019-08-31 NOTE — Patient Instructions (Signed)
Pre-Operative Instructions  Congratulations, you have decided to take an important step towards improving your quality of life.  You can be assured that the doctors and staff at [facility] will be with you every step of the way.  Here are some important things you should know:  1. Plan to be at the surgery center/hospital at least 1 (one) hour prior to your scheduled time, unless otherwise directed by the surgical center/hospital staff.  You must have a responsible adult accompany you, remain during the surgery and drive you home.  Make sure you have directions to the surgical center/hospital to ensure you arrive on time. 2. If you are having surgery at [facility] or [facility] hospitals, you will need a copy of your medical history and physical form from your family physician within one month prior to the date of surgery. We will give you a form for your primary physician to complete.  3. We make every effort to accommodate the date you request for surgery.  However, there are times where surgery dates or times have to be moved.  We will contact you as soon as possible if a change in schedule is required.   4. No aspirin/ibuprofen for one week before surgery.  If you are on aspirin, any non-steroidal anti-inflammatory medications (Mobic, Aleve, Ibuprofen) should not be taken seven (7) days prior to your surgery.  You make take Tylenol for pain prior to surgery.  5. Medications - If you are taking daily heart and blood pressure medications, seizure, reflux, allergy, asthma, anxiety, pain or diabetes medications, make sure you notify the surgery center/hospital before the day of surgery so they can tell you which medications you should take or avoid the day of surgery. 6. No food or drink after midnight the night before surgery unless directed otherwise by surgical center/hospital staff. 7. No alcoholic beverages 24-hours prior to surgery.  No smoking 24-hours prior or 24-hours after  surgery. 8. Wear loose pants or shorts. They should be loose enough to fit over bandages, boots, and casts. 9. Don't wear slip-on shoes. Sneakers are preferred. 10. Bring your boot with you to the surgery center/hospital.  Also bring crutches or a walker if your physician has prescribed it for you.  If you do not have this equipment, it will be provided for you after surgery. 11. If you have not been contacted by the surgery center/hospital by the day before your surgery, call to confirm the date and time of your surgery. 12. Leave-time from work may vary depending on the type of surgery you have.  Appropriate arrangements should be made prior to surgery with your employer. 13. Prescriptions will be provided immediately following surgery by your doctor.  Fill these as soon as possible after surgery and take the medication as directed. Pain medications will not be refilled on weekends and must be approved by the doctor. 14. Remove nail polish on the operative foot and avoid getting pedicures prior to surgery. 15. Wash the night before surgery.  The night before surgery wash the foot and leg well with water and the antibacterial soap provided. Be sure to pay special attention to beneath the toenails and in between the toes.  Wash for at least three (3) minutes. Rinse thoroughly with water and dry well with a towel.  Perform this wash unless told not to do so by your physician.  Enclosed: 1 Ice pack (please put in freezer the night before surgery)   1 Hibiclens skin cleaner     Pre-op instructions  If you have any questions regarding the instructions, please do not hesitate to call our office.  [place]: [address], [place], [place] [address] -- [phone]  [place]: [address] [place], [place] [address] -- [phone]  [place]: [address], [place],  [address] -- [phone]   Website: [email or web address] 

## 2019-09-01 ENCOUNTER — Telehealth: Payer: Self-pay | Admitting: *Deleted

## 2019-09-01 ENCOUNTER — Encounter: Payer: Medicare Other | Admitting: Sports Medicine

## 2019-09-01 NOTE — Telephone Encounter (Signed)
This is Ann Maki again. I was wanting to find out if I was going to be put on the schedule for Monday. Dr. Marylene Land was wanting me to be put on there. Please give me a call back. Thank you.

## 2019-09-01 NOTE — Telephone Encounter (Signed)
"  Dr. Marylene Land I believe talked to you yesterday about putting me on the schedule for Monday.  She told me to call you to get it done."

## 2019-09-02 ENCOUNTER — Telehealth: Payer: Self-pay | Admitting: *Deleted

## 2019-09-02 NOTE — Telephone Encounter (Signed)
I'm calling from Dr. Wynema Birch office.  I want to make sure you heard from someone from the surgical center.  "Yes, I have."

## 2019-09-02 NOTE — Telephone Encounter (Signed)
DOS 09/05/2019 REMOVAL OF HARDWARE RIGHT FOOT - 20680  [employer] MEDICARE: Eligibility Date - 08/26/2019  Member's plan does not have a deductible.  Out-of-Pocket Maximum Per Service Year $0.00 of $4,500.00 Met  Remaining: $4,500.00  $325.00 Copayment for Medicare-covered services provided to you at an outpatient hospital, including but not limited to hospital or other facility and physician charges.  This [employer] Medicare Advantage members plan does not currently require a prior authorization for these services. If you have general questions about the prior authorization requirements, please call us at [phone] or visit [email or web address] > Clinician Resources > Advance and Admission Notification Requirements. The number above acknowledges your notification. Please write this number down for future reference. Notification is not a guarantee of coverage or payment.  Decision ID #:[id number]

## 2019-09-04 ENCOUNTER — Other Ambulatory Visit: Payer: Self-pay | Admitting: Sports Medicine

## 2019-09-04 DIAGNOSIS — G8918 Other acute postprocedural pain: Secondary | ICD-10-CM

## 2019-09-04 NOTE — Progress Notes (Signed)
Post op meds entered  -Dr. Negar Sieler 

## 2019-09-05 ENCOUNTER — Telehealth: Payer: Self-pay | Admitting: Urology

## 2019-09-05 DIAGNOSIS — E7800 Pure hypercholesterolemia, unspecified: Secondary | ICD-10-CM | POA: Diagnosis not present

## 2019-09-05 DIAGNOSIS — T8484XA Pain due to internal orthopedic prosthetic devices, implants and grafts, initial encounter: Secondary | ICD-10-CM | POA: Diagnosis not present

## 2019-09-05 DIAGNOSIS — M86171 Other acute osteomyelitis, right ankle and foot: Secondary | ICD-10-CM | POA: Diagnosis not present

## 2019-09-05 DIAGNOSIS — Z472 Encounter for removal of internal fixation device: Secondary | ICD-10-CM | POA: Diagnosis not present

## 2019-09-05 DIAGNOSIS — Z4889 Encounter for other specified surgical aftercare: Secondary | ICD-10-CM | POA: Diagnosis not present

## 2019-09-05 MED ORDER — IBUPROFEN 800 MG PO TABS: 800.0000 mg | ORAL_TABLET | Freq: Three times a day (TID) | ORAL | 0 refills | Status: DC | PRN

## 2019-09-05 MED ORDER — PROMETHAZINE HCL 25 MG PO TABS: 25.0000 mg | ORAL_TABLET | Freq: Three times a day (TID) | ORAL | 0 refills | Status: DC | PRN

## 2019-09-05 MED ORDER — DOCUSATE SODIUM 100 MG PO CAPS: 100.0000 mg | ORAL_CAPSULE | Freq: Two times a day (BID) | ORAL | 0 refills | Status: DC

## 2019-09-05 NOTE — Telephone Encounter (Signed)
Cancel 1/14 and I will see her on 1/20 for POV#1 Thanks Dr.S

## 2019-09-05 NOTE — Telephone Encounter (Signed)
PT called wanting to know if she needs to keep her appointment for this week 1/14 for pov #3 since she just had sx today 1/11 and her first pov is on 1/20 for todays sx. Please advise. Thanks

## 2019-09-06 ENCOUNTER — Telehealth: Payer: Self-pay | Admitting: Sports Medicine

## 2019-09-06 NOTE — Telephone Encounter (Signed)
Post op check phone call made to patient. Patient reports that she is doing ok. She has ?s about dressing, I told her to keep clean, dry, and intact, and it theres strikethorough may change outer guaze layers only. I also told patient that her appt for this week is not necessary and we will see her as scheduled on next week. Patient expressed understanding. -Dr. Marylene Land

## 2019-09-08 ENCOUNTER — Encounter: Payer: Medicare Other | Admitting: Sports Medicine

## 2019-09-13 DIAGNOSIS — G894 Chronic pain syndrome: Secondary | ICD-10-CM | POA: Diagnosis not present

## 2019-09-13 DIAGNOSIS — M79673 Pain in unspecified foot: Secondary | ICD-10-CM | POA: Diagnosis not present

## 2019-09-13 DIAGNOSIS — M961 Postlaminectomy syndrome, not elsewhere classified: Secondary | ICD-10-CM | POA: Diagnosis not present

## 2019-09-13 DIAGNOSIS — M1710 Unilateral primary osteoarthritis, unspecified knee: Secondary | ICD-10-CM | POA: Diagnosis not present

## 2019-09-14 ENCOUNTER — Other Ambulatory Visit: Payer: Self-pay | Admitting: Sports Medicine

## 2019-09-14 ENCOUNTER — Ambulatory Visit (INDEPENDENT_AMBULATORY_CARE_PROVIDER_SITE_OTHER): Payer: Medicare Other | Admitting: Sports Medicine

## 2019-09-14 ENCOUNTER — Ambulatory Visit (INDEPENDENT_AMBULATORY_CARE_PROVIDER_SITE_OTHER): Payer: Medicare Other

## 2019-09-14 ENCOUNTER — Other Ambulatory Visit: Payer: Self-pay

## 2019-09-14 ENCOUNTER — Encounter: Payer: Self-pay | Admitting: Sports Medicine

## 2019-09-14 VITALS — BP 126/73 | HR 73 | Temp 99.3°F | Resp 16

## 2019-09-14 DIAGNOSIS — T84498A Other mechanical complication of other internal orthopedic devices, implants and grafts, initial encounter: Secondary | ICD-10-CM | POA: Diagnosis not present

## 2019-09-14 DIAGNOSIS — Z9889 Other specified postprocedural states: Secondary | ICD-10-CM

## 2019-09-14 DIAGNOSIS — T8130XA Disruption of wound, unspecified, initial encounter: Secondary | ICD-10-CM

## 2019-09-14 DIAGNOSIS — M205X1 Other deformities of toe(s) (acquired), right foot: Secondary | ICD-10-CM

## 2019-09-14 DIAGNOSIS — T8484XA Pain due to internal orthopedic prosthetic devices, implants and grafts, initial encounter: Secondary | ICD-10-CM

## 2019-09-14 NOTE — Progress Notes (Signed)
Subjective: Sonya Allen is a 64 y.o. female patient seen today in office for POV #1 (DOS 09/05/2019), S/P removal of hardware right foot.  Patient denies current pain at surgical site, but does state that when she sits with her leg down too long there is some throbbing to the foot.  Denies calf pain, denies headache, chest pain, shortness of breath, nausea, vomiting, fever, or chills.  Patient denies any bloody strikethrough on dressing and reports that she has been compliant with using crutches to stay off foot.  No other issues noted.   Patient Active Problem List   Diagnosis Date Noted  . Recurrent right knee instability 12/31/2017  . Acute pain of left shoulder 08/13/2017  . 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 Knee pain, right 01/23/2014  . 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  . Unilateral primary osteoarthritis, right knee 05/26/2013  . Other specified postprocedural states 01/31/2013    Current Outpatient Medications on File Prior to Visit  Medication Sig Dispense Refill  . betamethasone acetate-betamethasone sodium phosphate (CELESTONE) 6 (3-3) MG/ML injection Inject 12 mg into the articular space.    . bupivacaine, PF, (MARCAINE) 0.25 % SOLN injection Inject 25 mg into the skin.    . carisoprodol (SOMA) 350 MG tablet Take 350 mg by mouth 3 (three) times daily.    . carisoprodol (SOMA) 350 MG tablet     . clindamycin (CLEOCIN) 150 MG capsule Take 1 capsule (150 mg total) by mouth 2 (two) times daily. 14 capsule 0  . clonazePAM (KLONOPIN) 0.5 MG tablet Take 0.5 mg by mouth at bedtime. Restless legs    . clonazePAM (KLONOPIN) 0.5 MG tablet     . clonazePAM (KLONOPIN) 1 MG tablet     . conjugated estrogens (PREMARIN) vaginal cream     . diclofenac (CATAFLAM) 50 MG tablet     . diclofenac (VOLTAREN) 50 MG EC tablet     . diclofenac sodium (VOLTAREN) 1 % GEL     . docusate sodium (COLACE) 100 MG capsule Take 1 capsule (100 mg total) by mouth 2 (two) times daily. 10  capsule 0  . enoxaparin (LOVENOX) 40 MG/0.4ML injection Inject 0.4 mLs (40 mg total) into the skin daily. 12 Syringe 0  . escitalopram (LEXAPRO) 10 MG tablet     . furosemide (LASIX) 20 MG tablet Take 20 mg by mouth daily.    . furosemide (LASIX) 20 MG tablet     . GRALISE 600 MG TABS     . HYDROmorphone HCl (EXALGO) 12 MG T24A SR tablet     . Hylan 48 MG/6ML SOSY Inject 48 mg into the articular space.    Marland Kitchen ibuprofen (ADVIL) 800 MG tablet Take 1 tablet (800 mg total) by mouth every 8 (eight) hours as needed. 30 tablet 0  . lamoTRIgine (LAMICTAL) 100 MG tablet     . Lifitegrast (XIIDRA) 5 % SOLN     . LINZESS 290 MCG CAPS capsule     . methocarbamol (ROBAXIN) 500 MG tablet Take 1-2 tablets (500-1,000 mg total) by mouth every 6 (six) hours as needed for muscle spasms. 60 tablet 2  . morphine (MS CONTIN) 30 MG 12 hr tablet Take by mouth.    . morphine (MS CONTIN) 60 MG 12 hr tablet Take 60 mg by mouth every 12 (twelve) hours.    Marland Kitchen MOVANTIK 12.5 MG TABS     . NUVIGIL 150 MG tablet     . ondansetron (ZOFRAN) 4 MG tablet     .  ondansetron (ZOFRAN) 4 MG tablet     . oxycodone (ROXICODONE) 30 MG immediate release tablet Take 1 tablet (30 mg total) by mouth every 8 (eight) hours as needed (for breakthrough pain). 100 tablet 0  . oxyCODONE-acetaminophen (PERCOCET) 10-325 MG tablet     . Polyethyl Glycol-Propyl Glycol (SYSTANE OP) Apply 1 drop to eye 2 (two) times daily as needed (dry eyes).    . pramipexole (MIRAPEX) 0.125 MG tablet     . pravastatin (PRAVACHOL) 40 MG tablet Take 40 mg by mouth daily.    . pravastatin (PRAVACHOL) 40 MG tablet Take by mouth.    . PredniSONE 10 MG KIT Take as instructed for 12 days and a tapering dose 48 each 0  . pregabalin (LYRICA) 100 MG capsule Take by mouth daily.  0  . pregabalin (LYRICA) 150 MG capsule Take 150 mg by mouth daily.    . promethazine (PHENERGAN) 25 MG tablet Take 1 tablet (25 mg total) by mouth every 8 (eight) hours as needed for nausea or  vomiting. 20 tablet 0  . sulfamethoxazole-trimethoprim (BACTRIM) 400-80 MG tablet Take 1 tablet by mouth 2 (two) times daily. 28 tablet 0  . Suvorexant (BELSOMRA) 15 MG TABS     . tiZANidine (ZANAFLEX) 4 MG tablet     . topiramate (TOPAMAX) 100 MG tablet Take 100 mg by mouth 2 (two) times daily.    Marland Kitchen topiramate (TOPAMAX) 50 MG tablet     . venlafaxine XR (EFFEXOR-XR) 150 MG 24 hr capsule Take 150 mg by mouth daily.    Marland Kitchen venlafaxine XR (EFFEXOR-XR) 150 MG 24 hr capsule     . Vitamin D, Ergocalciferol, (DRISDOL) 50000 units CAPS capsule     . ZETIA 10 MG tablet      Current Facility-Administered Medications on File Prior to Visit  Medication Dose Route Frequency Provider Last Rate Last Admin  . triamcinolone acetonide (KENALOG) 10 MG/ML injection 10 mg  10 mg Other Once Landis Martins, DPM      . triamcinolone acetonide (KENALOG) 10 MG/ML injection 10 mg  10 mg Other Once [place], Annaleigh Steinmeyer, DPM      . triamcinolone acetonide (KENALOG) 10 MG/ML injection 10 mg  10 mg Other Once Landis Martins, DPM        Allergies  Allergen Reactions  . Pravastatin Sodium Other (See Comments)    Muscles  aches Muscles  aches   . Augmentin [Amoxicillin-Pot Clavulanate] Diarrhea    Objective: There were no vitals filed for this visit.  General: No acute distress, AAOx3  Right foot: Wound base measures 4.5 x 2.5 cm at the first metatarsophalangeal joint at area of dehiscence and postoperative surgical wound at the first metatarsophalangeal joint on right with antibiotic powder in place and a granular base, no erythema, no warmth, no drainage, no signs of infection noted, K wire intact at first toe, capillary fill time <5 seconds in all digits, gross sensation present via light touch to right foot.  No pain with calf compression.   Post Op Xray, Right foot: Radiolucency and first metatarsal from a history of screws and plate that are now removed, K wire and first MPJ lattice implant intact, mild soft tissue  swelling consistent with postoperative status chronic unchanged digital and pronation and ankle deformity noted.  No other acute findings. Assessment and Plan:  Problem List Items Addressed This Visit    None    Visit Diagnoses    S/P foot surgery, right    -  Primary  Exposed orthopaedic hardware ([place])       Painful orthopaedic hardware ([place])       Wound dehiscence           -Patient seen and evaluated -X-rays reviewed -Anticipate K wire to remain in place for at minimum at least 2 more weeks or as long as possible until we get some dorsal wound healing to prevent failure of lattice implant spacer at first MPJ -Applied dry sterile dressing to surgical site right foot secured with ACE wrap and stockinet  -Advised patient to make sure to keep dressings clean, dry, and intact to right surgical site, removing the ACE as needed  -Advised patient to continue with post-op shoe on right and crutches and wheelchair as needed to remain nonweightbearing on right foot -Advised patient to limit activity to necessity  -Advised patient to ice and elevate as necessary -Pending pathology results sample of metatarsal bone sent to rule out any osteomyelitis however meanwhile patient to continue with oral antibiotics until she is completed on Friday and once we get these pathology results I will let patient know if she needs to continue with more antibiotics -[facility]R [facility] in office for application of graft at wound dehiscence site -Will plan for wound check at next office visit. In the meantime, patient to call office if any issues or problems arise.   Landis Martins, DPM

## 2019-09-15 ENCOUNTER — Other Ambulatory Visit: Payer: Self-pay | Admitting: Sports Medicine

## 2019-09-15 ENCOUNTER — Telehealth: Payer: Self-pay | Admitting: *Deleted

## 2019-09-15 DIAGNOSIS — Z9889 Other specified postprocedural states: Secondary | ICD-10-CM

## 2019-09-15 DIAGNOSIS — T84498A Other mechanical complication of other internal orthopedic devices, implants and grafts, initial encounter: Secondary | ICD-10-CM

## 2019-09-15 NOTE — Telephone Encounter (Signed)
Required form, clinicals 09/14/2019 through 08/01/2019 Surgery date, and demographics faxed to [facility]. Emailed pt information to [employer]. [place] Levels.

## 2019-09-15 NOTE — Telephone Encounter (Signed)
-----   Message from Asencion Islam, [place] sent at 09/14/2019 11:47 AM EST ----- Regarding: IVR [facility]fix Post surgical wound 4.5x2.5cm at right 1st MTPJ

## 2019-09-21 ENCOUNTER — Other Ambulatory Visit: Payer: Self-pay

## 2019-09-21 ENCOUNTER — Ambulatory Visit (INDEPENDENT_AMBULATORY_CARE_PROVIDER_SITE_OTHER): Payer: Medicare Other | Admitting: Sports Medicine

## 2019-09-21 ENCOUNTER — Encounter: Payer: Self-pay | Admitting: Sports Medicine

## 2019-09-21 DIAGNOSIS — T8484XA Pain due to internal orthopedic prosthetic devices, implants and grafts, initial encounter: Secondary | ICD-10-CM

## 2019-09-21 DIAGNOSIS — G8918 Other acute postprocedural pain: Secondary | ICD-10-CM

## 2019-09-21 DIAGNOSIS — Z9889 Other specified postprocedural states: Secondary | ICD-10-CM

## 2019-09-21 DIAGNOSIS — T8130XA Disruption of wound, unspecified, initial encounter: Secondary | ICD-10-CM

## 2019-09-21 DIAGNOSIS — T84498A Other mechanical complication of other internal orthopedic devices, implants and grafts, initial encounter: Secondary | ICD-10-CM

## 2019-09-21 NOTE — Progress Notes (Signed)
Subjective: Sonya Allen is a 64 y.o. female patient seen today in office for POV #2 (DOS 09/05/2019), S/P removal of hardware right foot.  Patient denies current pain at surgical site, but have some pain sometimes at night 5 out of 10.  Patient denies calf pain, denies headache, chest pain, shortness of breath, nausea, vomiting, fever, or chills.  Patient reports that she has been trying to do the best that she can by staying off the foot and has been continuing to use her crutches.  No other issues noted.   Patient Active Problem List   Diagnosis Date Noted  . Recurrent right knee instability 12/31/2017  . Acute pain of left shoulder 08/13/2017  . 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 Knee pain, right 01/23/2014  . 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  . Unilateral primary osteoarthritis, right knee 05/26/2013  . Other specified postprocedural states 01/31/2013    Current Outpatient Medications on File Prior to Visit  Medication Sig Dispense Refill  . betamethasone acetate-betamethasone sodium phosphate (CELESTONE) 6 (3-3) MG/ML injection Inject 12 mg into the articular space.    . bupivacaine, PF, (MARCAINE) 0.25 % SOLN injection Inject 25 mg into the skin.    . carisoprodol (SOMA) 350 MG tablet Take 350 mg by mouth 3 (three) times daily.    . carisoprodol (SOMA) 350 MG tablet     . clindamycin (CLEOCIN) 150 MG capsule Take 1 capsule (150 mg total) by mouth 2 (two) times daily. 14 capsule 0  . clonazePAM (KLONOPIN) 0.5 MG tablet Take 0.5 mg by mouth at bedtime. Restless legs    . clonazePAM (KLONOPIN) 0.5 MG tablet     . clonazePAM (KLONOPIN) 1 MG tablet     . conjugated estrogens (PREMARIN) vaginal cream     . diclofenac (CATAFLAM) 50 MG tablet     . diclofenac (VOLTAREN) 50 MG EC tablet     . diclofenac sodium (VOLTAREN) 1 % GEL     . docusate sodium (COLACE) 100 MG capsule Take 1 capsule (100 mg total) by mouth 2 (two) times daily. 10 capsule 0  . enoxaparin (LOVENOX) 40  MG/0.4ML injection Inject 0.4 mLs (40 mg total) into the skin daily. 12 Syringe 0  . escitalopram (LEXAPRO) 10 MG tablet     . furosemide (LASIX) 20 MG tablet Take 20 mg by mouth daily.    . furosemide (LASIX) 20 MG tablet     . GRALISE 600 MG TABS     . HYDROmorphone HCl (EXALGO) 12 MG T24A SR tablet     . Hylan 48 MG/6ML SOSY Inject 48 mg into the articular space.    Marland Kitchen ibuprofen (ADVIL) 800 MG tablet Take 1 tablet (800 mg total) by mouth every 8 (eight) hours as needed. 30 tablet 0  . lamoTRIgine (LAMICTAL) 100 MG tablet     . Lifitegrast (XIIDRA) 5 % SOLN     . LINZESS 290 MCG CAPS capsule     . methocarbamol (ROBAXIN) 500 MG tablet Take 1-2 tablets (500-1,000 mg total) by mouth every 6 (six) hours as needed for muscle spasms. 60 tablet 2  . morphine (MS CONTIN) 30 MG 12 hr tablet Take by mouth.    . morphine (MS CONTIN) 60 MG 12 hr tablet Take 60 mg by mouth every 12 (twelve) hours.    Marland Kitchen MOVANTIK 12.5 MG TABS     . NUVIGIL 150 MG tablet     . ondansetron (ZOFRAN) 4 MG tablet     .  ondansetron (ZOFRAN) 4 MG tablet     . oxycodone (ROXICODONE) 30 MG immediate release tablet Take 1 tablet (30 mg total) by mouth every 8 (eight) hours as needed (for breakthrough pain). 100 tablet 0  . oxyCODONE-acetaminophen (PERCOCET) 10-325 MG tablet     . Polyethyl Glycol-Propyl Glycol (SYSTANE OP) Apply 1 drop to eye 2 (two) times daily as needed (dry eyes).    . pramipexole (MIRAPEX) 0.125 MG tablet     . pravastatin (PRAVACHOL) 40 MG tablet Take 40 mg by mouth daily.    . pravastatin (PRAVACHOL) 40 MG tablet Take by mouth.    . PredniSONE 10 MG KIT Take as instructed for 12 days and a tapering dose 48 each 0  . pregabalin (LYRICA) 100 MG capsule Take by mouth daily.  0  . pregabalin (LYRICA) 150 MG capsule Take 150 mg by mouth daily.    . promethazine (PHENERGAN) 25 MG tablet Take 1 tablet (25 mg total) by mouth every 8 (eight) hours as needed for nausea or vomiting. 20 tablet 0  .  sulfamethoxazole-trimethoprim (BACTRIM) 400-80 MG tablet Take 1 tablet by mouth 2 (two) times daily. 28 tablet 0  . Suvorexant (BELSOMRA) 15 MG TABS     . tiZANidine (ZANAFLEX) 4 MG tablet     . topiramate (TOPAMAX) 100 MG tablet Take 100 mg by mouth 2 (two) times daily.    Marland Kitchen topiramate (TOPAMAX) 50 MG tablet     . venlafaxine XR (EFFEXOR-XR) 150 MG 24 hr capsule Take 150 mg by mouth daily.    Marland Kitchen venlafaxine XR (EFFEXOR-XR) 150 MG 24 hr capsule     . Vitamin D, Ergocalciferol, (DRISDOL) 50000 units CAPS capsule     . ZETIA 10 MG tablet      Current Facility-Administered Medications on File Prior to Visit  Medication Dose Route Frequency Provider Last Rate Last Admin  . triamcinolone acetonide (KENALOG) 10 MG/ML injection 10 mg  10 mg Other Once Landis Martins, DPM      . triamcinolone acetonide (KENALOG) 10 MG/ML injection 10 mg  10 mg Other Once [place], Clelia Trabucco, DPM      . triamcinolone acetonide (KENALOG) 10 MG/ML injection 10 mg  10 mg Other Once Landis Martins, DPM        Allergies  Allergen Reactions  . Pravastatin Sodium Other (See Comments)    Muscles  aches Muscles  aches   . Augmentin [Amoxicillin-Pot Clavulanate] Diarrhea    Objective: There were no vitals filed for this visit.  General: No acute distress, AAOx3  Right foot: Wound base measures 4.0 x 1.9 cm smaller in nature at the first metatarsophalangeal joint at area of dehiscence and postoperative surgical wound at the first metatarsophalangeal joint on right with antibiotic powder in place and a granular base, no erythema, no warmth, no drainage, no signs of infection noted, K wire intact at first toe, capillary fill time <5 seconds in all digits, gross sensation present via light touch to right foot.  No pain with calf compression.   Assessment and Plan:  Problem List Items Addressed This Visit    None    Visit Diagnoses    S/P foot surgery, right    -  Primary   Exposed orthopaedic hardware ([place])        Painful orthopaedic hardware ([place])       Wound dehiscence       Post-op pain           -Patient seen and evaluated -Anticipate  K wire to remain in place for at minimum at least 1 more weeks or as long as possible until we get some dorsal wound healing to prevent failure of lattice implant spacer at first MPJ -Applied Xeroform and dry sterile dressing to surgical site right foot secured with ACE wrap and stockinet  -Advised patient to make sure to keep dressings clean, dry, and intact to right surgical site, removing the ACE as needed  -Advised patient to continue with post-op shoe on right and crutches and wheelchair as needed to remain nonweightbearing on right foot -Advised patient to limit activity to necessity  -Advised patient to ice and elevate as necessary -Pending pathology results and [facility] approval for graft at wound dehiscence site -Will plan for wound check at next office visit. In the meantime, patient to call office if any issues or problems arise.   Landis Martins, DPM

## 2019-09-30 ENCOUNTER — Other Ambulatory Visit: Payer: Self-pay | Admitting: Sports Medicine

## 2019-09-30 ENCOUNTER — Ambulatory Visit (INDEPENDENT_AMBULATORY_CARE_PROVIDER_SITE_OTHER): Payer: Medicare Other | Admitting: Sports Medicine

## 2019-09-30 ENCOUNTER — Other Ambulatory Visit: Payer: Self-pay

## 2019-09-30 ENCOUNTER — Encounter: Payer: Self-pay | Admitting: Sports Medicine

## 2019-09-30 ENCOUNTER — Ambulatory Visit (INDEPENDENT_AMBULATORY_CARE_PROVIDER_SITE_OTHER): Payer: Medicare Other

## 2019-09-30 DIAGNOSIS — Z969 Presence of functional implant, unspecified: Secondary | ICD-10-CM

## 2019-09-30 DIAGNOSIS — Z9889 Other specified postprocedural states: Secondary | ICD-10-CM

## 2019-09-30 DIAGNOSIS — T8130XA Disruption of wound, unspecified, initial encounter: Secondary | ICD-10-CM

## 2019-09-30 DIAGNOSIS — T8484XA Pain due to internal orthopedic prosthetic devices, implants and grafts, initial encounter: Secondary | ICD-10-CM

## 2019-09-30 DIAGNOSIS — M79671 Pain in right foot: Secondary | ICD-10-CM

## 2019-09-30 NOTE — Progress Notes (Addendum)
Subjective: Sonya Allen is a 64 y.o. female patient seen today in office for POV #3 (DOS 09/05/2019), S/P removal of hardware right foot.  Patient denies current pain at surgical site, but states that she is doing ok, hopes graft is here.  Patient denies calf pain, denies headache, chest pain, shortness of breath, nausea, vomiting, fever, or chills. No other issues noted.   Patient Active Problem List   Diagnosis Date Noted  . Recurrent right knee instability 12/31/2017  . Acute pain of left shoulder 08/13/2017  . 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 Knee pain, right 01/23/2014  . 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  . Unilateral primary osteoarthritis, right knee 05/26/2013  . Other specified postprocedural states 01/31/2013    Current Outpatient Medications on File Prior to Visit  Medication Sig Dispense Refill  . betamethasone acetate-betamethasone sodium phosphate (CELESTONE) 6 (3-3) MG/ML injection Inject 12 mg into the articular space.    . bupivacaine, PF, (MARCAINE) 0.25 % SOLN injection Inject 25 mg into the skin.    . carisoprodol (SOMA) 350 MG tablet Take 350 mg by mouth 3 (three) times daily.    . carisoprodol (SOMA) 350 MG tablet     . clindamycin (CLEOCIN) 150 MG capsule Take 1 capsule (150 mg total) by mouth 2 (two) times daily. 14 capsule 0  . clonazePAM (KLONOPIN) 0.5 MG tablet Take 0.5 mg by mouth at bedtime. Restless legs    . clonazePAM (KLONOPIN) 0.5 MG tablet     . clonazePAM (KLONOPIN) 1 MG tablet     . conjugated estrogens (PREMARIN) vaginal cream     . diclofenac (CATAFLAM) 50 MG tablet     . diclofenac (VOLTAREN) 50 MG EC tablet     . diclofenac sodium (VOLTAREN) 1 % GEL     . docusate sodium (COLACE) 100 MG capsule Take 1 capsule (100 mg total) by mouth 2 (two) times daily. 10 capsule 0  . enoxaparin (LOVENOX) 40 MG/0.4ML injection Inject 0.4 mLs (40 mg total) into the skin daily. 12 Syringe 0  . escitalopram (LEXAPRO) 10 MG tablet     .  furosemide (LASIX) 20 MG tablet Take 20 mg by mouth daily.    . furosemide (LASIX) 20 MG tablet     . GRALISE 600 MG TABS     . HYDROmorphone HCl (EXALGO) 12 MG T24A SR tablet     . Hylan 48 MG/6ML SOSY Inject 48 mg into the articular space.    Marland Kitchen ibuprofen (ADVIL) 800 MG tablet Take 1 tablet (800 mg total) by mouth every 8 (eight) hours as needed. 30 tablet 0  . lamoTRIgine (LAMICTAL) 100 MG tablet     . Lifitegrast (XIIDRA) 5 % SOLN     . LINZESS 290 MCG CAPS capsule     . methocarbamol (ROBAXIN) 500 MG tablet Take 1-2 tablets (500-1,000 mg total) by mouth every 6 (six) hours as needed for muscle spasms. 60 tablet 2  . morphine (MS CONTIN) 30 MG 12 hr tablet Take by mouth.    . morphine (MS CONTIN) 60 MG 12 hr tablet Take 60 mg by mouth every 12 (twelve) hours.    Marland Kitchen MOVANTIK 12.5 MG TABS     . NUVIGIL 150 MG tablet     . ondansetron (ZOFRAN) 4 MG tablet     . ondansetron (ZOFRAN) 4 MG tablet     . oxycodone (ROXICODONE) 30 MG immediate release tablet Take 1 tablet (30 mg total) by mouth every 8 (  eight) hours as needed (for breakthrough pain). 100 tablet 0  . oxyCODONE-acetaminophen (PERCOCET) 10-325 MG tablet     . Polyethyl Glycol-Propyl Glycol (SYSTANE OP) Apply 1 drop to eye 2 (two) times daily as needed (dry eyes).    . pramipexole (MIRAPEX) 0.125 MG tablet     . pravastatin (PRAVACHOL) 40 MG tablet Take 40 mg by mouth daily.    . pravastatin (PRAVACHOL) 40 MG tablet Take by mouth.    . PredniSONE 10 MG KIT Take as instructed for 12 days and a tapering dose 48 each 0  . pregabalin (LYRICA) 100 MG capsule Take by mouth daily.  0  . pregabalin (LYRICA) 150 MG capsule Take 150 mg by mouth daily.    . promethazine (PHENERGAN) 25 MG tablet Take 1 tablet (25 mg total) by mouth every 8 (eight) hours as needed for nausea or vomiting. 20 tablet 0  . sulfamethoxazole-trimethoprim (BACTRIM) 400-80 MG tablet Take 1 tablet by mouth 2 (two) times daily. 28 tablet 0  . Suvorexant (BELSOMRA) 15 MG  TABS     . tiZANidine (ZANAFLEX) 4 MG tablet     . topiramate (TOPAMAX) 100 MG tablet Take 100 mg by mouth 2 (two) times daily.    Marland Kitchen topiramate (TOPAMAX) 50 MG tablet     . venlafaxine XR (EFFEXOR-XR) 150 MG 24 hr capsule Take 150 mg by mouth daily.    Marland Kitchen venlafaxine XR (EFFEXOR-XR) 150 MG 24 hr capsule     . Vitamin D, Ergocalciferol, (DRISDOL) 50000 units CAPS capsule     . ZETIA 10 MG tablet      Current Facility-Administered Medications on File Prior to Visit  Medication Dose Route Frequency Provider Last Rate Last Admin  . triamcinolone acetonide (KENALOG) 10 MG/ML injection 10 mg  10 mg Other Once Landis Martins, DPM      . triamcinolone acetonide (KENALOG) 10 MG/ML injection 10 mg  10 mg Other Once [place], Zollie Ellery, DPM      . triamcinolone acetonide (KENALOG) 10 MG/ML injection 10 mg  10 mg Other Once Landis Martins, DPM        Allergies  Allergen Reactions  . Pravastatin Sodium Other (See Comments)    Muscles  aches Muscles  aches   . Augmentin [Amoxicillin-Pot Clavulanate] Diarrhea    Objective: There were no vitals filed for this visit.  General: No acute distress, AAOx3  Right foot: Wound base measures 4.0 x 1.8 cm smaller in nature at the first metatarsophalangeal joint at area of dehiscence and postoperative surgical wound at the first metatarsophalangeal joint on right with antibiotic powder in place and a granular base, no erythema, no warmth, no drainage, no signs of infection noted, K wire intact at first toe, capillary fill time <5 seconds in all digits, gross sensation present via light touch to right foot.  No pain with calf compression.   Xrays consistent with post op status   Assessment and Plan:  Problem List Items Addressed This Visit    None    Visit Diagnoses    Wound dehiscence    -  Primary   S/P foot surgery, right       Painful orthopaedic hardware [facility])           -Patient seen and evaluated -Xrays reviewed  -K-wire removed - Excisionally  dedbrided ulceration at dorsal 1st ray on right to healthy bleeding borders removing nonviable tissue using a sterile chisel blade. Wound measures post debridement as above. Wound was debrided to the  level of the dermis with viable wound base exposed to promote healing. Hemostasis was achieved with manuel pressure. Patient tolerated procedure well without any discomfort or anesthesia necessary for this wound debridement.  -Applied oasis tri-layer matrix 3x3.5cm lot [id number] secured with adaptic and steristrips and dry sterile dressing and instructed patient to keep clean, dry, and intact. THIS IS APPLICATION #1. -Advised patient to continue with post-op shoe on right and crutches and wheelchair as needed to remain nonweightbearing on right foot at the forefoot -Advised patient to limit activity to necessity  -Advised patient to ice and elevate as necessary -Return in 1 week for continued wound care/oasis graft application.  Landis Martins, DPM

## 2019-10-05 ENCOUNTER — Encounter: Payer: Medicare Other | Admitting: Sports Medicine

## 2019-10-07 ENCOUNTER — Encounter: Payer: Self-pay | Admitting: Sports Medicine

## 2019-10-07 ENCOUNTER — Other Ambulatory Visit: Payer: Self-pay

## 2019-10-07 ENCOUNTER — Ambulatory Visit (INDEPENDENT_AMBULATORY_CARE_PROVIDER_SITE_OTHER): Payer: Medicare Other | Admitting: Sports Medicine

## 2019-10-07 DIAGNOSIS — T8484XA Pain due to internal orthopedic prosthetic devices, implants and grafts, initial encounter: Secondary | ICD-10-CM

## 2019-10-07 DIAGNOSIS — T8130XA Disruption of wound, unspecified, initial encounter: Secondary | ICD-10-CM

## 2019-10-07 DIAGNOSIS — Z9889 Other specified postprocedural states: Secondary | ICD-10-CM

## 2019-10-07 MED ORDER — SULFAMETHOXAZOLE-TRIMETHOPRIM 400-80 MG PO TABS: 1.0000 | ORAL_TABLET | Freq: Two times a day (BID) | ORAL | 0 refills | Status: DC

## 2019-10-07 NOTE — Progress Notes (Addendum)
Subjective: Sonya Allen is a 64 y.o. female patient seen today in office for POV #4 (DOS 09/05/2019), S/P removal of hardware right foot.  Patient admits to a little pain at most 3 out of 10 and reports that she has some drainage and had to change the bandage on Wednesday due to blood and reports when she removed the dressing it had a little odor but looks better after it was removed at the surgical/graft site, Patient denies calf pain, denies headache, chest pain, shortness of breath, nausea, vomiting, fever, or chills. No other issues noted.   Patient Active Problem List   Diagnosis Date Noted  . Recurrent right knee instability 12/31/2017  . Acute pain of left shoulder 08/13/2017  . 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 Knee pain, right 01/23/2014  . 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  . Unilateral primary osteoarthritis, right knee 05/26/2013  . Other specified postprocedural states 01/31/2013    Current Outpatient Medications on File Prior to Visit  Medication Sig Dispense Refill  . betamethasone acetate-betamethasone sodium phosphate (CELESTONE) 6 (3-3) MG/ML injection Inject 12 mg into the articular space.    . bupivacaine, PF, (MARCAINE) 0.25 % SOLN injection Inject 25 mg into the skin.    . carisoprodol (SOMA) 350 MG tablet Take 350 mg by mouth 3 (three) times daily.    . carisoprodol (SOMA) 350 MG tablet     . clindamycin (CLEOCIN) 150 MG capsule Take 1 capsule (150 mg total) by mouth 2 (two) times daily. 14 capsule 0  . clonazePAM (KLONOPIN) 0.5 MG tablet Take 0.5 mg by mouth at bedtime. Restless legs    . clonazePAM (KLONOPIN) 0.5 MG tablet     . clonazePAM (KLONOPIN) 1 MG tablet     . conjugated estrogens (PREMARIN) vaginal cream     . diclofenac (CATAFLAM) 50 MG tablet     . diclofenac (VOLTAREN) 50 MG EC tablet     . diclofenac sodium (VOLTAREN) 1 % GEL     . docusate sodium (COLACE) 100 MG capsule Take 1 capsule (100 mg total) by mouth 2 (two) times daily. 10  capsule 0  . enoxaparin (LOVENOX) 40 MG/0.4ML injection Inject 0.4 mLs (40 mg total) into the skin daily. 12 Syringe 0  . escitalopram (LEXAPRO) 10 MG tablet     . furosemide (LASIX) 20 MG tablet Take 20 mg by mouth daily.    . furosemide (LASIX) 20 MG tablet     . GRALISE 600 MG TABS     . HYDROmorphone HCl (EXALGO) 12 MG T24A SR tablet     . Hylan 48 MG/6ML SOSY Inject 48 mg into the articular space.    Marland Kitchen ibuprofen (ADVIL) 800 MG tablet Take 1 tablet (800 mg total) by mouth every 8 (eight) hours as needed. 30 tablet 0  . lamoTRIgine (LAMICTAL) 100 MG tablet     . Lifitegrast (XIIDRA) 5 % SOLN     . LINZESS 290 MCG CAPS capsule     . methocarbamol (ROBAXIN) 500 MG tablet Take 1-2 tablets (500-1,000 mg total) by mouth every 6 (six) hours as needed for muscle spasms. 60 tablet 2  . morphine (MS CONTIN) 30 MG 12 hr tablet Take by mouth.    . morphine (MS CONTIN) 60 MG 12 hr tablet Take 60 mg by mouth every 12 (twelve) hours.    Marland Kitchen MOVANTIK 12.5 MG TABS     . NUVIGIL 150 MG tablet     . ondansetron (ZOFRAN) 4  MG tablet     . ondansetron (ZOFRAN) 4 MG tablet     . oxycodone (ROXICODONE) 30 MG immediate release tablet Take 1 tablet (30 mg total) by mouth every 8 (eight) hours as needed (for breakthrough pain). 100 tablet 0  . oxyCODONE-acetaminophen (PERCOCET) 10-325 MG tablet     . Polyethyl Glycol-Propyl Glycol (SYSTANE OP) Apply 1 drop to eye 2 (two) times daily as needed (dry eyes).    . pramipexole (MIRAPEX) 0.125 MG tablet     . pravastatin (PRAVACHOL) 40 MG tablet Take 40 mg by mouth daily.    . pravastatin (PRAVACHOL) 40 MG tablet Take by mouth.    . PredniSONE 10 MG KIT Take as instructed for 12 days and a tapering dose 48 each 0  . pregabalin (LYRICA) 100 MG capsule Take by mouth daily.  0  . pregabalin (LYRICA) 150 MG capsule Take 150 mg by mouth daily.    . promethazine (PHENERGAN) 25 MG tablet Take 1 tablet (25 mg total) by mouth every 8 (eight) hours as needed for nausea or  vomiting. 20 tablet 0  . Suvorexant (BELSOMRA) 15 MG TABS     . tiZANidine (ZANAFLEX) 4 MG tablet     . topiramate (TOPAMAX) 100 MG tablet Take 100 mg by mouth 2 (two) times daily.    Marland Kitchen topiramate (TOPAMAX) 50 MG tablet     . venlafaxine XR (EFFEXOR-XR) 150 MG 24 hr capsule Take 150 mg by mouth daily.    Marland Kitchen venlafaxine XR (EFFEXOR-XR) 150 MG 24 hr capsule     . Vitamin D, Ergocalciferol, (DRISDOL) 50000 units CAPS capsule     . ZETIA 10 MG tablet      Current Facility-Administered Medications on File Prior to Visit  Medication Dose Route Frequency Provider Last Rate Last Admin  . triamcinolone acetonide (KENALOG) 10 MG/ML injection 10 mg  10 mg Other Once Landis Martins, DPM      . triamcinolone acetonide (KENALOG) 10 MG/ML injection 10 mg  10 mg Other Once [place], Arlee Bossard, DPM      . triamcinolone acetonide (KENALOG) 10 MG/ML injection 10 mg  10 mg Other Once Landis Martins, DPM        Allergies  Allergen Reactions  . Pravastatin Sodium Other (See Comments)    Muscles  aches Muscles  aches   . Augmentin [Amoxicillin-Pot Clavulanate] Diarrhea    Objective: There were no vitals filed for this visit.  General: No acute distress, AAOx3  Right foot: Wound base measures 4.0 x 1.2 cm smaller in nature at the first metatarsophalangeal joint at area of dehiscence and postoperative surgical wound at the first metatarsophalangeal joint on right with antibiotic powder in place and a granular base, faint erythema, no warmth, no drainage, no signs of infection noted, capillary fill time <5 seconds in all digits, gross sensation present via light touch to right foot.  No pain with calf compression.    Assessment and Plan:  Problem List Items Addressed This Visit    None    Visit Diagnoses    Wound dehiscence    -  Primary   Relevant Medications   sulfamethoxazole-trimethoprim (BACTRIM) 400-80 MG tablet   S/P foot surgery, right       Painful orthopaedic hardware [facility])            -Patient seen and evaluated -Cleansed ulceration at dorsal 1st ray on right to healthy bleeding borders removing nonviable tissue. Wound measures post debridement as above.  -Research scientist (medical)  matrix 3x3.5cm lot [id number] secured with adaptic and steristrips and dry sterile dressing and instructed patient to keep clean, dry, and intact. THIS IS APPLICATION #2. -Advised patient to continue with post-op shoe on right and crutches and wheelchair as needed to remain nonweightbearing on right foot at the forefoot -Advised patient to limit activity to necessity  -Advised patient to ice and elevate as necessary -Refill Bactrim -Continue with as needed medications and ibuprofen as needed -Advised patient to keep dressing clean dry and intact until next week to change outer layers if there is bloody strikethrough or if she noticed that there is increased pain no swelling or redness to exposed toes -Return in 1 week for continued wound care/oasis graft application.  Landis Martins, DPM

## 2019-10-14 ENCOUNTER — Other Ambulatory Visit: Payer: Self-pay

## 2019-10-14 ENCOUNTER — Encounter: Payer: Self-pay | Admitting: Sports Medicine

## 2019-10-14 ENCOUNTER — Ambulatory Visit (INDEPENDENT_AMBULATORY_CARE_PROVIDER_SITE_OTHER): Payer: Medicare Other | Admitting: Sports Medicine

## 2019-10-14 DIAGNOSIS — T8484XA Pain due to internal orthopedic prosthetic devices, implants and grafts, initial encounter: Secondary | ICD-10-CM

## 2019-10-14 DIAGNOSIS — Z9889 Other specified postprocedural states: Secondary | ICD-10-CM

## 2019-10-14 DIAGNOSIS — T84498A Other mechanical complication of other internal orthopedic devices, implants and grafts, initial encounter: Secondary | ICD-10-CM

## 2019-10-14 DIAGNOSIS — T8130XA Disruption of wound, unspecified, initial encounter: Secondary | ICD-10-CM

## 2019-10-14 NOTE — Progress Notes (Signed)
Subjective: Sonya Allen is a 64 y.o. female patient seen today in office for POV # 5 (DOS 09/05/2019), S/P removal of hardware right foot and application of Oasis graft #2 applied last visit.  Patient admits to a little pain and reports that she is smelling odor is smell similar to a bedsore and reports that she did have to change the dressing once this week on Tuesday otherwise denies excessive drainage through outer layers and reports that she has been consistent with taking her Bactrim antibiotic wearing her surgical shoe elevating and taking ibuprofen as needed for pain, Patient denies calf pain, denies headache, chest pain, shortness of breath, nausea, vomiting, fever, or chills. No other issues noted.   Patient Active Problem List   Diagnosis Date Noted  . Recurrent right knee instability 12/31/2017  . Acute pain of left shoulder 08/13/2017  . 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 Knee pain, right 01/23/2014  . 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  . Unilateral primary osteoarthritis, right knee 05/26/2013  . Other specified postprocedural states 01/31/2013    Current Outpatient Medications on File Prior to Visit  Medication Sig Dispense Refill  . betamethasone acetate-betamethasone sodium phosphate (CELESTONE) 6 (3-3) MG/ML injection Inject 12 mg into the articular space.    . bupivacaine, PF, (MARCAINE) 0.25 % SOLN injection Inject 25 mg into the skin.    . carisoprodol (SOMA) 350 MG tablet Take 350 mg by mouth 3 (three) times daily.    . carisoprodol (SOMA) 350 MG tablet     . clindamycin (CLEOCIN) 150 MG capsule Take 1 capsule (150 mg total) by mouth 2 (two) times daily. 14 capsule 0  . clonazePAM (KLONOPIN) 0.5 MG tablet Take 0.5 mg by mouth at bedtime. Restless legs    . clonazePAM (KLONOPIN) 0.5 MG tablet     . clonazePAM (KLONOPIN) 1 MG tablet     . conjugated estrogens (PREMARIN) vaginal cream     . diclofenac (CATAFLAM) 50 MG tablet     . diclofenac (VOLTAREN) 50  MG EC tablet     . diclofenac sodium (VOLTAREN) 1 % GEL     . docusate sodium (COLACE) 100 MG capsule Take 1 capsule (100 mg total) by mouth 2 (two) times daily. 10 capsule 0  . enoxaparin (LOVENOX) 40 MG/0.4ML injection Inject 0.4 mLs (40 mg total) into the skin daily. 12 Syringe 0  . escitalopram (LEXAPRO) 10 MG tablet     . furosemide (LASIX) 20 MG tablet Take 20 mg by mouth daily.    . furosemide (LASIX) 20 MG tablet     . GRALISE 600 MG TABS     . HYDROmorphone HCl (EXALGO) 12 MG T24A SR tablet     . Hylan 48 MG/6ML SOSY Inject 48 mg into the articular space.    Marland Kitchen ibuprofen (ADVIL) 800 MG tablet Take 1 tablet (800 mg total) by mouth every 8 (eight) hours as needed. 30 tablet 0  . lamoTRIgine (LAMICTAL) 100 MG tablet     . Lifitegrast (XIIDRA) 5 % SOLN     . LINZESS 290 MCG CAPS capsule     . methocarbamol (ROBAXIN) 500 MG tablet Take 1-2 tablets (500-1,000 mg total) by mouth every 6 (six) hours as needed for muscle spasms. 60 tablet 2  . morphine (MS CONTIN) 30 MG 12 hr tablet Take by mouth.    . morphine (MS CONTIN) 60 MG 12 hr tablet Take 60 mg by mouth every 12 (twelve) hours.    Marland Kitchen  MOVANTIK 12.5 MG TABS     . NUVIGIL 150 MG tablet     . ondansetron (ZOFRAN) 4 MG tablet     . ondansetron (ZOFRAN) 4 MG tablet     . oxycodone (ROXICODONE) 30 MG immediate release tablet Take 1 tablet (30 mg total) by mouth every 8 (eight) hours as needed (for breakthrough pain). 100 tablet 0  . oxyCODONE-acetaminophen (PERCOCET) 10-325 MG tablet     . Polyethyl Glycol-Propyl Glycol (SYSTANE OP) Apply 1 drop to eye 2 (two) times daily as needed (dry eyes).    . pramipexole (MIRAPEX) 0.125 MG tablet     . pravastatin (PRAVACHOL) 40 MG tablet Take 40 mg by mouth daily.    . pravastatin (PRAVACHOL) 40 MG tablet Take by mouth.    . PredniSONE 10 MG KIT Take as instructed for 12 days and a tapering dose 48 each 0  . pregabalin (LYRICA) 100 MG capsule Take by mouth daily.  0  . pregabalin (LYRICA) 150 MG  capsule Take 150 mg by mouth daily.    . promethazine (PHENERGAN) 25 MG tablet Take 1 tablet (25 mg total) by mouth every 8 (eight) hours as needed for nausea or vomiting. 20 tablet 0  . sulfamethoxazole-trimethoprim (BACTRIM) 400-80 MG tablet Take 1 tablet by mouth 2 (two) times daily. 28 tablet 0  . Suvorexant (BELSOMRA) 15 MG TABS     . tiZANidine (ZANAFLEX) 4 MG tablet     . topiramate (TOPAMAX) 100 MG tablet Take 100 mg by mouth 2 (two) times daily.    Marland Kitchen topiramate (TOPAMAX) 50 MG tablet     . venlafaxine XR (EFFEXOR-XR) 150 MG 24 hr capsule Take 150 mg by mouth daily.    Marland Kitchen venlafaxine XR (EFFEXOR-XR) 150 MG 24 hr capsule     . Vitamin D, Ergocalciferol, (DRISDOL) 50000 units CAPS capsule     . ZETIA 10 MG tablet      Current Facility-Administered Medications on File Prior to Visit  Medication Dose Route Frequency Provider Last Rate Last Admin  . triamcinolone acetonide (KENALOG) 10 MG/ML injection 10 mg  10 mg Other Once Landis Martins, DPM      . triamcinolone acetonide (KENALOG) 10 MG/ML injection 10 mg  10 mg Other Once [place], Nijel Flink, DPM      . triamcinolone acetonide (KENALOG) 10 MG/ML injection 10 mg  10 mg Other Once Landis Martins, DPM        Allergies  Allergen Reactions  . Pravastatin Sodium Other (See Comments)    Muscles  aches Muscles  aches   . Augmentin [Amoxicillin-Pot Clavulanate] Diarrhea    Objective: There were no vitals filed for this visit.  General: No acute distress, AAOx3  Right foot: Wound base measures 3.2 x 1.0 cm smaller in nature at the first metatarsophalangeal joint at area of dehiscence and postoperative surgical wound at the first metatarsophalangeal joint on right with fibrotic, last slough present likely byproduct of Oasis graft, blanchable erythema, no warmth, clear to bloody drainage, scant malodor, no other acute signs of infection noted, capillary fill time <5 seconds in all digits, gross sensation present via light touch to right  foot.  No pain with calf compression.    Assessment and Plan:  Problem List Items Addressed This Visit    None    Visit Diagnoses    Wound dehiscence    -  Primary   S/P foot surgery, right       Painful orthopaedic hardware ([place])  Exposed orthopaedic hardware [facility])            -Patient seen and evaluated -Cleansed ulceration at dorsal 1st ray on right to healthy bleeding borders removing nonviable tissue with a moistened saline sponge. Wound measures post debridement as above.  -Applied Betadine and Telfa and dry sterile dressing and instructed patient to keep clean, dry, and intact -No Oasis graft was applied at this visit due to small amount of malodor and significant amount of graft still present at the wound with buildup -Advised patient to continue with post-op shoe on right and crutches and wheelchair as needed to remain nonweightbearing on right foot at the forefoot -Advised patient to limit activity to necessity  -Advised patient to ice and elevate as necessary -Continue with Bactrim until completed -Continue with as needed medications and ibuprofen as needed -Advised patient to keep dressing clean dry and intact until next week to change outer layers if there is bloody strikethrough or if she noticed that there is increased pain no swelling or redness to exposed toes like before -Return in 1 week for continued wound care/possible oasis graft application.  Landis Martins, DPM

## 2019-10-18 DIAGNOSIS — G894 Chronic pain syndrome: Secondary | ICD-10-CM | POA: Diagnosis not present

## 2019-10-18 DIAGNOSIS — M542 Cervicalgia: Secondary | ICD-10-CM | POA: Diagnosis not present

## 2019-10-18 DIAGNOSIS — Z79891 Long term (current) use of opiate analgesic: Secondary | ICD-10-CM | POA: Diagnosis not present

## 2019-10-18 DIAGNOSIS — Z79899 Other long term (current) drug therapy: Secondary | ICD-10-CM | POA: Diagnosis not present

## 2019-10-18 DIAGNOSIS — M961 Postlaminectomy syndrome, not elsewhere classified: Secondary | ICD-10-CM | POA: Diagnosis not present

## 2019-10-18 DIAGNOSIS — M79673 Pain in unspecified foot: Secondary | ICD-10-CM | POA: Diagnosis not present

## 2019-10-21 ENCOUNTER — Ambulatory Visit (INDEPENDENT_AMBULATORY_CARE_PROVIDER_SITE_OTHER): Payer: Medicare Other | Admitting: Sports Medicine

## 2019-10-21 ENCOUNTER — Other Ambulatory Visit: Payer: Self-pay

## 2019-10-21 ENCOUNTER — Encounter: Payer: Self-pay | Admitting: Sports Medicine

## 2019-10-21 DIAGNOSIS — T8130XA Disruption of wound, unspecified, initial encounter: Secondary | ICD-10-CM

## 2019-10-21 DIAGNOSIS — G8918 Other acute postprocedural pain: Secondary | ICD-10-CM

## 2019-10-21 DIAGNOSIS — T84498A Other mechanical complication of other internal orthopedic devices, implants and grafts, initial encounter: Secondary | ICD-10-CM

## 2019-10-21 DIAGNOSIS — Z9889 Other specified postprocedural states: Secondary | ICD-10-CM

## 2019-10-21 DIAGNOSIS — T8484XA Pain due to internal orthopedic prosthetic devices, implants and grafts, initial encounter: Secondary | ICD-10-CM

## 2019-10-21 MED ORDER — SULFAMETHOXAZOLE-TRIMETHOPRIM 400-80 MG PO TABS: 1.0000 | ORAL_TABLET | Freq: Two times a day (BID) | ORAL | 0 refills | Status: DC

## 2019-10-21 NOTE — Progress Notes (Signed)
Subjective: Sonya Allen is a 64 y.o. female patient seen today in office for POV # 6 (DOS 09/05/2019), S/P removal of hardware right foot and application of Oasis graft #2 applied 2 visits ago.  Patient admits to a little pain 1-2 out of 10 and reports that since last visit she did have to change the dressing once due to drainage however denies any significant redness swelling or odor at this time.  Patient does report that she only has one antibiotic pill left.  Patient denies calf pain, denies headache, chest pain, shortness of breath, nausea, vomiting, fever, or chills. No other issues noted.   Patient Active Problem List   Diagnosis Date Noted  . Recurrent right knee instability 12/31/2017  . Acute pain of left shoulder 08/13/2017  . 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 Knee pain, right 01/23/2014  . 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  . Unilateral primary osteoarthritis, right knee 05/26/2013  . Other specified postprocedural states 01/31/2013    Current Outpatient Medications on File Prior to Visit  Medication Sig Dispense Refill  . betamethasone acetate-betamethasone sodium phosphate (CELESTONE) 6 (3-3) MG/ML injection Inject 12 mg into the articular space.    . bupivacaine, PF, (MARCAINE) 0.25 % SOLN injection Inject 25 mg into the skin.    . carisoprodol (SOMA) 350 MG tablet Take 350 mg by mouth 3 (three) times daily.    . carisoprodol (SOMA) 350 MG tablet     . clindamycin (CLEOCIN) 150 MG capsule Take 1 capsule (150 mg total) by mouth 2 (two) times daily. 14 capsule 0  . clonazePAM (KLONOPIN) 0.5 MG tablet Take 0.5 mg by mouth at bedtime. Restless legs    . clonazePAM (KLONOPIN) 0.5 MG tablet     . clonazePAM (KLONOPIN) 1 MG tablet     . conjugated estrogens (PREMARIN) vaginal cream     . diclofenac (CATAFLAM) 50 MG tablet     . diclofenac (VOLTAREN) 50 MG EC tablet     . diclofenac sodium (VOLTAREN) 1 % GEL     . docusate sodium (COLACE) 100 MG capsule Take 1  capsule (100 mg total) by mouth 2 (two) times daily. 10 capsule 0  . enoxaparin (LOVENOX) 40 MG/0.4ML injection Inject 0.4 mLs (40 mg total) into the skin daily. 12 Syringe 0  . escitalopram (LEXAPRO) 10 MG tablet     . furosemide (LASIX) 20 MG tablet Take 20 mg by mouth daily.    . furosemide (LASIX) 20 MG tablet     . GRALISE 600 MG TABS     . HYDROmorphone HCl (EXALGO) 12 MG T24A SR tablet     . Hylan 48 MG/6ML SOSY Inject 48 mg into the articular space.    Marland Kitchen ibuprofen (ADVIL) 800 MG tablet Take 1 tablet (800 mg total) by mouth every 8 (eight) hours as needed. 30 tablet 0  . lamoTRIgine (LAMICTAL) 100 MG tablet     . Lifitegrast (XIIDRA) 5 % SOLN     . LINZESS 290 MCG CAPS capsule     . methocarbamol (ROBAXIN) 500 MG tablet Take 1-2 tablets (500-1,000 mg total) by mouth every 6 (six) hours as needed for muscle spasms. 60 tablet 2  . morphine (MS CONTIN) 30 MG 12 hr tablet Take by mouth.    . morphine (MS CONTIN) 60 MG 12 hr tablet Take 60 mg by mouth every 12 (twelve) hours.    Marland Kitchen MOVANTIK 12.5 MG TABS     . NUVIGIL 150 MG  tablet     . ondansetron (ZOFRAN) 4 MG tablet     . ondansetron (ZOFRAN) 4 MG tablet     . oxycodone (ROXICODONE) 30 MG immediate release tablet Take 1 tablet (30 mg total) by mouth every 8 (eight) hours as needed (for breakthrough pain). 100 tablet 0  . oxyCODONE-acetaminophen (PERCOCET) 10-325 MG tablet     . Polyethyl Glycol-Propyl Glycol (SYSTANE OP) Apply 1 drop to eye 2 (two) times daily as needed (dry eyes).    . pramipexole (MIRAPEX) 0.125 MG tablet     . pravastatin (PRAVACHOL) 40 MG tablet Take 40 mg by mouth daily.    . pravastatin (PRAVACHOL) 40 MG tablet Take by mouth.    . PredniSONE 10 MG KIT Take as instructed for 12 days and a tapering dose 48 each 0  . pregabalin (LYRICA) 100 MG capsule Take by mouth daily.  0  . pregabalin (LYRICA) 150 MG capsule Take 150 mg by mouth daily.    . promethazine (PHENERGAN) 25 MG tablet Take 1 tablet (25 mg total) by  mouth every 8 (eight) hours as needed for nausea or vomiting. 20 tablet 0  . Suvorexant (BELSOMRA) 15 MG TABS     . tiZANidine (ZANAFLEX) 4 MG tablet     . topiramate (TOPAMAX) 100 MG tablet Take 100 mg by mouth 2 (two) times daily.    Marland Kitchen topiramate (TOPAMAX) 50 MG tablet     . venlafaxine XR (EFFEXOR-XR) 150 MG 24 hr capsule Take 150 mg by mouth daily.    Marland Kitchen venlafaxine XR (EFFEXOR-XR) 150 MG 24 hr capsule     . Vitamin D, Ergocalciferol, (DRISDOL) 50000 units CAPS capsule     . ZETIA 10 MG tablet      Current Facility-Administered Medications on File Prior to Visit  Medication Dose Route Frequency Provider Last Rate Last Admin  . triamcinolone acetonide (KENALOG) 10 MG/ML injection 10 mg  10 mg Other Once Landis Martins, DPM      . triamcinolone acetonide (KENALOG) 10 MG/ML injection 10 mg  10 mg Other Once [place], Giulia Hickey, DPM      . triamcinolone acetonide (KENALOG) 10 MG/ML injection 10 mg  10 mg Other Once Landis Martins, DPM        Allergies  Allergen Reactions  . Pravastatin Sodium Other (See Comments)    Muscles  aches Muscles  aches   . Augmentin [Amoxicillin-Pot Clavulanate] Diarrhea    Objective: There were no vitals filed for this visit.  General: No acute distress, AAOx3  Right foot: Wound base measures 2.2 x 0.8 cm smaller in nature at the first metatarsophalangeal joint at area of dehiscence and postoperative surgical wound at the first metatarsophalangeal joint on right with fibrotic slough/byproduct of Oasis graft, much improved blanchable erythema, no warmth, clear to bloody drainage, no malodor, no other acute signs of infection noted, capillary fill time <5 seconds in all digits, gross sensation present via light touch to right foot.  No pain with calf compression.    Assessment and Plan:  Problem List Items Addressed This Visit    None    Visit Diagnoses    S/P foot surgery, right    -  Primary   Wound dehiscence       Relevant Medications    sulfamethoxazole-trimethoprim (BACTRIM) 400-80 MG tablet   Painful orthopaedic hardware [facility])       Exposed orthopaedic hardware ([place])       Post-op pain            -  Patient seen and evaluated -Cleansed ulceration at dorsal 1st ray on right to healthy bleeding borders removing nonviable tissue with a moistened saline sponge. Wound measures post debridement as above.  -Applied Betadine and Telfa and dry sterile dressing and instructed patient to keep clean, dry, and intact may change if there is some strikethrough but otherwise keep intact. -No Oasis graft was applied at this visit since she is improving without it without odor and decreased drainage -Advised patient to continue with post-op shoe on right and crutches and wheelchair as needed to remain nonweightbearing/very limited weightbearing on right foot at the forefoot -Advised patient to limit activity to necessity  -Advised patient to ice and elevate as necessary -Bactrim refilled -Continue with as needed medications and ibuprofen as needed -Return in 1 week for continued wound care/possible oasis graft application if we need to reapply.  Landis Martins, DPM

## 2019-10-26 DIAGNOSIS — Z1231 Encounter for screening mammogram for malignant neoplasm of breast: Secondary | ICD-10-CM | POA: Diagnosis not present

## 2019-10-28 ENCOUNTER — Ambulatory Visit (INDEPENDENT_AMBULATORY_CARE_PROVIDER_SITE_OTHER): Payer: Medicare Other | Admitting: Sports Medicine

## 2019-10-28 ENCOUNTER — Other Ambulatory Visit: Payer: Self-pay

## 2019-10-28 ENCOUNTER — Encounter: Payer: Self-pay | Admitting: Sports Medicine

## 2019-10-28 DIAGNOSIS — T8484XA Pain due to internal orthopedic prosthetic devices, implants and grafts, initial encounter: Secondary | ICD-10-CM

## 2019-10-28 DIAGNOSIS — Z9889 Other specified postprocedural states: Secondary | ICD-10-CM

## 2019-10-28 DIAGNOSIS — T8130XA Disruption of wound, unspecified, initial encounter: Secondary | ICD-10-CM

## 2019-10-28 NOTE — Progress Notes (Signed)
Subjective: Sonya Allen is a 64 y.o. female patient seen today in office for POV # 7 (DOS 09/05/2019), S/P removal of hardware right foot and application of Oasis graft #2 applied 3 visits ago.  Patient reports that she is doing ok and feels like its healing, pain every now and then. Reports that she has more Bactrim left and is taking it with no issues.  Patient denies calf pain, denies headache, chest pain, shortness of breath, nausea, vomiting, fever, or chills. No other issues noted.   Patient Active Problem List   Diagnosis Date Noted  . Recurrent right knee instability 12/31/2017  . Acute pain of left shoulder 08/13/2017  . 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 Knee pain, right 01/23/2014  . 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  . Unilateral primary osteoarthritis, right knee 05/26/2013  . Other specified postprocedural states 01/31/2013    Current Outpatient Medications on File Prior to Visit  Medication Sig Dispense Refill  . betamethasone acetate-betamethasone sodium phosphate (CELESTONE) 6 (3-3) MG/ML injection Inject 12 mg into the articular space.    . bupivacaine, PF, (MARCAINE) 0.25 % SOLN injection Inject 25 mg into the skin.    . carisoprodol (SOMA) 350 MG tablet Take 350 mg by mouth 3 (three) times daily.    . carisoprodol (SOMA) 350 MG tablet     . clindamycin (CLEOCIN) 150 MG capsule Take 1 capsule (150 mg total) by mouth 2 (two) times daily. 14 capsule 0  . clonazePAM (KLONOPIN) 0.5 MG tablet Take 0.5 mg by mouth at bedtime. Restless legs    . clonazePAM (KLONOPIN) 0.5 MG tablet     . clonazePAM (KLONOPIN) 1 MG tablet     . conjugated estrogens (PREMARIN) vaginal cream     . diclofenac (CATAFLAM) 50 MG tablet     . diclofenac (VOLTAREN) 50 MG EC tablet     . diclofenac sodium (VOLTAREN) 1 % GEL     . docusate sodium (COLACE) 100 MG capsule Take 1 capsule (100 mg total) by mouth 2 (two) times daily. 10 capsule 0  . enoxaparin (LOVENOX) 40 MG/0.4ML injection  Inject 0.4 mLs (40 mg total) into the skin daily. 12 Syringe 0  . escitalopram (LEXAPRO) 10 MG tablet     . furosemide (LASIX) 20 MG tablet Take 20 mg by mouth daily.    . furosemide (LASIX) 20 MG tablet     . GRALISE 600 MG TABS     . HYDROmorphone HCl (EXALGO) 12 MG T24A SR tablet     . Hylan 48 MG/6ML SOSY Inject 48 mg into the articular space.    Marland Kitchen ibuprofen (ADVIL) 800 MG tablet Take 1 tablet (800 mg total) by mouth every 8 (eight) hours as needed. 30 tablet 0  . lamoTRIgine (LAMICTAL) 100 MG tablet     . Lifitegrast (XIIDRA) 5 % SOLN     . LINZESS 290 MCG CAPS capsule     . methocarbamol (ROBAXIN) 500 MG tablet Take 1-2 tablets (500-1,000 mg total) by mouth every 6 (six) hours as needed for muscle spasms. 60 tablet 2  . morphine (MS CONTIN) 30 MG 12 hr tablet Take by mouth.    . morphine (MS CONTIN) 60 MG 12 hr tablet Take 60 mg by mouth every 12 (twelve) hours.    Marland Kitchen MOVANTIK 12.5 MG TABS     . NUVIGIL 150 MG tablet     . ondansetron (ZOFRAN) 4 MG tablet     . ondansetron (ZOFRAN) 4  MG tablet     . oxycodone (ROXICODONE) 30 MG immediate release tablet Take 1 tablet (30 mg total) by mouth every 8 (eight) hours as needed (for breakthrough pain). 100 tablet 0  . oxyCODONE-acetaminophen (PERCOCET) 10-325 MG tablet     . Polyethyl Glycol-Propyl Glycol (SYSTANE OP) Apply 1 drop to eye 2 (two) times daily as needed (dry eyes).    . pramipexole (MIRAPEX) 0.125 MG tablet     . pravastatin (PRAVACHOL) 40 MG tablet Take 40 mg by mouth daily.    . pravastatin (PRAVACHOL) 40 MG tablet Take by mouth.    . PredniSONE 10 MG KIT Take as instructed for 12 days and a tapering dose 48 each 0  . pregabalin (LYRICA) 100 MG capsule Take by mouth daily.  0  . pregabalin (LYRICA) 150 MG capsule Take 150 mg by mouth daily.    . promethazine (PHENERGAN) 25 MG tablet Take 1 tablet (25 mg total) by mouth every 8 (eight) hours as needed for nausea or vomiting. 20 tablet 0  . sulfamethoxazole-trimethoprim  (BACTRIM) 400-80 MG tablet Take 1 tablet by mouth 2 (two) times daily. 28 tablet 0  . Suvorexant (BELSOMRA) 15 MG TABS     . tiZANidine (ZANAFLEX) 4 MG tablet     . topiramate (TOPAMAX) 100 MG tablet Take 100 mg by mouth 2 (two) times daily.    Marland Kitchen topiramate (TOPAMAX) 50 MG tablet     . venlafaxine XR (EFFEXOR-XR) 150 MG 24 hr capsule Take 150 mg by mouth daily.    Marland Kitchen venlafaxine XR (EFFEXOR-XR) 150 MG 24 hr capsule     . Vitamin D, Ergocalciferol, (DRISDOL) 50000 units CAPS capsule     . ZETIA 10 MG tablet      Current Facility-Administered Medications on File Prior to Visit  Medication Dose Route Frequency Provider Last Rate Last Admin  . triamcinolone acetonide (KENALOG) 10 MG/ML injection 10 mg  10 mg Other Once Landis Martins, DPM      . triamcinolone acetonide (KENALOG) 10 MG/ML injection 10 mg  10 mg Other Once [place], Brek Reece, DPM      . triamcinolone acetonide (KENALOG) 10 MG/ML injection 10 mg  10 mg Other Once Landis Martins, DPM        Allergies  Allergen Reactions  . Pravastatin Sodium Other (See Comments)    Muscles  aches Muscles  aches   . Augmentin [Amoxicillin-Pot Clavulanate] Diarrhea    Objective: There were no vitals filed for this visit.  General: No acute distress, AAOx3  Right foot: Wound base measures 1 x 0.7 cm smaller in nature at the first metatarsophalangeal joint at area of dehiscence and postoperative surgical wound at the first metatarsophalangeal joint on right with fibrotic slough, much improved blanchable erythema, no warmth, clear to bloody drainage, no malodor, no other acute signs of infection noted, capillary fill time <5 seconds in all digits, gross sensation present via light touch to right foot.  No pain with calf compression.    Assessment and Plan:  Problem List Items Addressed This Visit    None    Visit Diagnoses    Wound dehiscence    -  Primary   S/P foot surgery, right       Painful orthopaedic hardware [facility])             -Patient seen and evaluated -Cleansed ulceration at dorsal 1st ray on right to healthy bleeding borders removing nonviable tissue with a moistened saline sponge. Wound measures post debridement  as above.  -Applied PRISMA and dry sterile dressing and instructed patient to keep clean, dry, and intact may change if there is some strikethrough but otherwise keep intact. -No Oasis graft was applied again today at this visit since she is improving without it without odor and decreased drainage -Advised patient to continue with post-op shoe on right and crutches and wheelchair as needed to remain nonweightbearing/very limited weightbearing on right foot at the forefoot -Advised patient to limit activity to necessity  -Advised patient to ice and elevate as necessary -Continue with Bactrim until completed -Continue with as needed medications and ibuprofen as needed -Return in 1 week for continued wound care/possible oasis graft application if we need to reapply.  Landis Martins, DPM

## 2019-11-04 ENCOUNTER — Ambulatory Visit (INDEPENDENT_AMBULATORY_CARE_PROVIDER_SITE_OTHER): Payer: Medicare Other | Admitting: Sports Medicine

## 2019-11-04 ENCOUNTER — Other Ambulatory Visit: Payer: Self-pay

## 2019-11-04 ENCOUNTER — Encounter: Payer: Self-pay | Admitting: Sports Medicine

## 2019-11-04 DIAGNOSIS — T84498A Other mechanical complication of other internal orthopedic devices, implants and grafts, initial encounter: Secondary | ICD-10-CM | POA: Diagnosis not present

## 2019-11-04 DIAGNOSIS — T8484XA Pain due to internal orthopedic prosthetic devices, implants and grafts, initial encounter: Secondary | ICD-10-CM

## 2019-11-04 DIAGNOSIS — Z9889 Other specified postprocedural states: Secondary | ICD-10-CM

## 2019-11-04 DIAGNOSIS — T8130XA Disruption of wound, unspecified, initial encounter: Secondary | ICD-10-CM | POA: Diagnosis not present

## 2019-11-04 NOTE — Progress Notes (Signed)
Subjective: Sonya Allen is a 63 y.o. female patient seen today in office for POV # 8 (DOS 09/05/2019), S/P removal of hardware right foot and application of Oasis graft #2 applied 4 visits ago.  Patient reports that she is doing good.  Patient denies calf pain, denies headache, chest pain, shortness of breath, nausea, vomiting, fever, or chills. No other issues noted.   Patient Active Problem List   Diagnosis Date Noted  . Recurrent right knee instability 12/31/2017  . Acute pain of left shoulder 08/13/2017  . 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 Knee pain, right 01/23/2014  . 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  . Unilateral primary osteoarthritis, right knee 05/26/2013  . Other specified postprocedural states 01/31/2013    Current Outpatient Medications on File Prior to Visit  Medication Sig Dispense Refill  . betamethasone acetate-betamethasone sodium phosphate (CELESTONE) 6 (3-3) MG/ML injection Inject 12 mg into the articular space.    . bupivacaine, PF, (MARCAINE) 0.25 % SOLN injection Inject 25 mg into the skin.    . carisoprodol (SOMA) 350 MG tablet Take 350 mg by mouth 3 (three) times daily.    . carisoprodol (SOMA) 350 MG tablet     . clindamycin (CLEOCIN) 150 MG capsule Take 1 capsule (150 mg total) by mouth 2 (two) times daily. 14 capsule 0  . clonazePAM (KLONOPIN) 0.5 MG tablet Take 0.5 mg by mouth at bedtime. Restless legs    . clonazePAM (KLONOPIN) 0.5 MG tablet     . clonazePAM (KLONOPIN) 1 MG tablet     . conjugated estrogens (PREMARIN) vaginal cream     . diclofenac (CATAFLAM) 50 MG tablet     . diclofenac (VOLTAREN) 50 MG EC tablet     . diclofenac sodium (VOLTAREN) 1 % GEL     . docusate sodium (COLACE) 100 MG capsule Take 1 capsule (100 mg total) by mouth 2 (two) times daily. 10 capsule 0  . enoxaparin (LOVENOX) 40 MG/0.4ML injection Inject 0.4 mLs (40 mg total) into the skin daily. 12 Syringe 0  . escitalopram (LEXAPRO) 10 MG tablet     . furosemide  (LASIX) 20 MG tablet Take 20 mg by mouth daily.    . furosemide (LASIX) 20 MG tablet     . GRALISE 600 MG TABS     . HYDROmorphone HCl (EXALGO) 12 MG T24A SR tablet     . Hylan 48 MG/6ML SOSY Inject 48 mg into the articular space.    Marland Kitchen ibuprofen (ADVIL) 800 MG tablet Take 1 tablet (800 mg total) by mouth every 8 (eight) hours as needed. 30 tablet 0  . lamoTRIgine (LAMICTAL) 100 MG tablet     . Lifitegrast (XIIDRA) 5 % SOLN     . LINZESS 290 MCG CAPS capsule     . methocarbamol (ROBAXIN) 500 MG tablet Take 1-2 tablets (500-1,000 mg total) by mouth every 6 (six) hours as needed for muscle spasms. 60 tablet 2  . morphine (MS CONTIN) 30 MG 12 hr tablet Take by mouth.    . morphine (MS CONTIN) 60 MG 12 hr tablet Take 60 mg by mouth every 12 (twelve) hours.    Marland Kitchen MOVANTIK 12.5 MG TABS     . NUVIGIL 150 MG tablet     . ondansetron (ZOFRAN) 4 MG tablet     . ondansetron (ZOFRAN) 4 MG tablet     . oxycodone (ROXICODONE) 30 MG immediate release tablet Take 1 tablet (30 mg total) by mouth every 8 (  eight) hours as needed (for breakthrough pain). 100 tablet 0  . oxyCODONE-acetaminophen (PERCOCET) 10-325 MG tablet     . Polyethyl Glycol-Propyl Glycol (SYSTANE OP) Apply 1 drop to eye 2 (two) times daily as needed (dry eyes).    . pramipexole (MIRAPEX) 0.125 MG tablet     . pravastatin (PRAVACHOL) 40 MG tablet Take 40 mg by mouth daily.    . pravastatin (PRAVACHOL) 40 MG tablet Take by mouth.    . PredniSONE 10 MG KIT Take as instructed for 12 days and a tapering dose 48 each 0  . pregabalin (LYRICA) 100 MG capsule Take by mouth daily.  0  . pregabalin (LYRICA) 150 MG capsule Take 150 mg by mouth daily.    . promethazine (PHENERGAN) 25 MG tablet Take 1 tablet (25 mg total) by mouth every 8 (eight) hours as needed for nausea or vomiting. 20 tablet 0  . sulfamethoxazole-trimethoprim (BACTRIM) 400-80 MG tablet Take 1 tablet by mouth 2 (two) times daily. 28 tablet 0  . Suvorexant (BELSOMRA) 15 MG TABS     .  tiZANidine (ZANAFLEX) 4 MG tablet     . topiramate (TOPAMAX) 100 MG tablet Take 100 mg by mouth 2 (two) times daily.    Marland Kitchen topiramate (TOPAMAX) 50 MG tablet     . venlafaxine XR (EFFEXOR-XR) 150 MG 24 hr capsule Take 150 mg by mouth daily.    Marland Kitchen venlafaxine XR (EFFEXOR-XR) 150 MG 24 hr capsule     . Vitamin D, Ergocalciferol, (DRISDOL) 50000 units CAPS capsule     . ZETIA 10 MG tablet      Current Facility-Administered Medications on File Prior to Visit  Medication Dose Route Frequency Provider Last Rate Last Admin  . triamcinolone acetonide (KENALOG) 10 MG/ML injection 10 mg  10 mg Other Once Landis Martins, DPM      . triamcinolone acetonide (KENALOG) 10 MG/ML injection 10 mg  10 mg Other Once [place], Allayna Erlich, DPM      . triamcinolone acetonide (KENALOG) 10 MG/ML injection 10 mg  10 mg Other Once Landis Martins, DPM        Allergies  Allergen Reactions  . Pravastatin Sodium Other (See Comments)    Muscles  aches Muscles  aches   . Augmentin [Amoxicillin-Pot Clavulanate] Diarrhea    Objective: There were no vitals filed for this visit.  General: No acute distress, AAOx3  Right foot: Wound base measures 0.5 x 0.3 cm smaller in nature at the first metatarsophalangeal joint at area of dehiscence and postoperative surgical wound at the first metatarsophalangeal joint on right with fibrotic slough, much improved blanchable erythema, no warmth, clear to bloody drainage, no malodor, no other acute signs of infection noted, capillary fill time <5 seconds in all digits, gross sensation present via light touch to right foot.  No pain with calf compression.    Assessment and Plan:  Problem List Items Addressed This Visit    None    Visit Diagnoses    Wound dehiscence    -  Primary   S/P foot surgery, right       Painful orthopaedic hardware [facility])       Exposed orthopaedic hardware [facility])           -Patient seen and evaluated -Cleansed ulceration at dorsal 1st ray on right to healthy  bleeding borders removing nonviable tissue with a moistened saline sponge, nonselective debridement. Wound measures post debridement as above.  -Applied PRISMA and dry sterile dressing and instructed patient to  keep clean, dry, and intact may change if there is some strikethrough but otherwise keep intact. -No Oasis graft was applied again today at this visit since she is improving without it without odor and decreased drainage so reapplied Prisma as above -Advised patient to continue with post-op shoe on right and refrain from driving more than 30 minutes in a trip -Advised patient to limit activity to necessity  -Advised patient to ice and elevate as necessary -Continue with as needed medications and ibuprofen as needed -Return in 1 week for continued wound care.  Landis Martins, DPM

## 2019-11-11 ENCOUNTER — Other Ambulatory Visit: Payer: Self-pay

## 2019-11-11 ENCOUNTER — Ambulatory Visit (INDEPENDENT_AMBULATORY_CARE_PROVIDER_SITE_OTHER): Payer: Medicare Other | Admitting: Sports Medicine

## 2019-11-11 ENCOUNTER — Encounter: Payer: Self-pay | Admitting: Sports Medicine

## 2019-11-11 ENCOUNTER — Encounter: Payer: Medicare Other | Admitting: Sports Medicine

## 2019-11-11 DIAGNOSIS — L02611 Cutaneous abscess of right foot: Secondary | ICD-10-CM

## 2019-11-11 DIAGNOSIS — L03031 Cellulitis of right toe: Secondary | ICD-10-CM | POA: Diagnosis not present

## 2019-11-11 DIAGNOSIS — M79671 Pain in right foot: Secondary | ICD-10-CM

## 2019-11-11 DIAGNOSIS — Z9889 Other specified postprocedural states: Secondary | ICD-10-CM

## 2019-11-11 DIAGNOSIS — T8130XA Disruption of wound, unspecified, initial encounter: Secondary | ICD-10-CM

## 2019-11-11 MED ORDER — SULFAMETHOXAZOLE-TRIMETHOPRIM 400-80 MG PO TABS: 1.0000 | ORAL_TABLET | Freq: Two times a day (BID) | ORAL | 0 refills | Status: DC

## 2019-11-11 NOTE — Addendum Note (Signed)
Addended by: Dinah Beers on: 11/11/2019 02:32 PM   Modules accepted: Orders

## 2019-11-11 NOTE — Progress Notes (Signed)
Subjective: Sonya Allen is a 64 y.o. female patient seen today in office for POV # 9 (DOS 09/05/2019), S/P removal of hardware right foot and application of Oasis graft #2 applied 5 visits ago.  Patient reports that she was doing good but then on Tuesday it started hurting really bad and diet to drain more and getting more swollen and red again patient reports that there has been some sharp pain and the only thing that she has done differently over the weekend she made a trip to [place] and reports that pain is worse when she is attempting to walk pain is 4 out of 10 sharp in nature.  Patient denies calf pain, denies headache, chest pain, shortness of breath, nausea, vomiting, fever, or chills. No other issues noted.   Patient Active Problem List   Diagnosis Date Noted  . Recurrent right knee instability 12/31/2017  . Acute pain of left shoulder 08/13/2017  . 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 Knee pain, right 01/23/2014  . 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  . Unilateral primary osteoarthritis, right knee 05/26/2013  . Other specified postprocedural states 01/31/2013    Current Outpatient Medications on File Prior to Visit  Medication Sig Dispense Refill  . betamethasone acetate-betamethasone sodium phosphate (CELESTONE) 6 (3-3) MG/ML injection Inject 12 mg into the articular space.    . bupivacaine, PF, (MARCAINE) 0.25 % SOLN injection Inject 25 mg into the skin.    . carisoprodol (SOMA) 350 MG tablet Take 350 mg by mouth 3 (three) times daily.    . carisoprodol (SOMA) 350 MG tablet     . clindamycin (CLEOCIN) 150 MG capsule Take 1 capsule (150 mg total) by mouth 2 (two) times daily. 14 capsule 0  . clonazePAM (KLONOPIN) 0.5 MG tablet Take 0.5 mg by mouth at bedtime. Restless legs    . clonazePAM (KLONOPIN) 0.5 MG tablet     . clonazePAM (KLONOPIN) 1 MG tablet     . conjugated estrogens (PREMARIN) vaginal cream     . diclofenac (CATAFLAM) 50 MG tablet     . diclofenac  (VOLTAREN) 50 MG EC tablet     . diclofenac sodium (VOLTAREN) 1 % GEL     . docusate sodium (COLACE) 100 MG capsule Take 1 capsule (100 mg total) by mouth 2 (two) times daily. 10 capsule 0  . enoxaparin (LOVENOX) 40 MG/0.4ML injection Inject 0.4 mLs (40 mg total) into the skin daily. 12 Syringe 0  . escitalopram (LEXAPRO) 10 MG tablet     . furosemide (LASIX) 20 MG tablet Take 20 mg by mouth daily.    . furosemide (LASIX) 20 MG tablet     . GRALISE 600 MG TABS     . HYDROmorphone HCl (EXALGO) 12 MG T24A SR tablet     . Hylan 48 MG/6ML SOSY Inject 48 mg into the articular space.    Marland Kitchen ibuprofen (ADVIL) 800 MG tablet Take 1 tablet (800 mg total) by mouth every 8 (eight) hours as needed. 30 tablet 0  . lamoTRIgine (LAMICTAL) 100 MG tablet     . Lifitegrast (XIIDRA) 5 % SOLN     . LINZESS 290 MCG CAPS capsule     . methocarbamol (ROBAXIN) 500 MG tablet Take 1-2 tablets (500-1,000 mg total) by mouth every 6 (six) hours as needed for muscle spasms. 60 tablet 2  . morphine (MS CONTIN) 30 MG 12 hr tablet Take by mouth.    . morphine (MS CONTIN) 60 MG 12 hr  tablet Take 60 mg by mouth every 12 (twelve) hours.    Marland Kitchen MOVANTIK 12.5 MG TABS     . NUVIGIL 150 MG tablet     . ondansetron (ZOFRAN) 4 MG tablet     . ondansetron (ZOFRAN) 4 MG tablet     . oxycodone (ROXICODONE) 30 MG immediate release tablet Take 1 tablet (30 mg total) by mouth every 8 (eight) hours as needed (for breakthrough pain). 100 tablet 0  . oxyCODONE-acetaminophen (PERCOCET) 10-325 MG tablet     . Polyethyl Glycol-Propyl Glycol (SYSTANE OP) Apply 1 drop to eye 2 (two) times daily as needed (dry eyes).    . pramipexole (MIRAPEX) 0.125 MG tablet     . pravastatin (PRAVACHOL) 40 MG tablet Take 40 mg by mouth daily.    . pravastatin (PRAVACHOL) 40 MG tablet Take by mouth.    . PredniSONE 10 MG KIT Take as instructed for 12 days and a tapering dose 48 each 0  . pregabalin (LYRICA) 100 MG capsule Take by mouth daily.  0  . pregabalin  (LYRICA) 150 MG capsule Take 150 mg by mouth daily.    . promethazine (PHENERGAN) 25 MG tablet Take 1 tablet (25 mg total) by mouth every 8 (eight) hours as needed for nausea or vomiting. 20 tablet 0  . Suvorexant (BELSOMRA) 15 MG TABS     . tiZANidine (ZANAFLEX) 4 MG tablet     . topiramate (TOPAMAX) 100 MG tablet Take 100 mg by mouth 2 (two) times daily.    Marland Kitchen topiramate (TOPAMAX) 50 MG tablet     . venlafaxine XR (EFFEXOR-XR) 150 MG 24 hr capsule Take 150 mg by mouth daily.    Marland Kitchen venlafaxine XR (EFFEXOR-XR) 150 MG 24 hr capsule     . Vitamin D, Ergocalciferol, (DRISDOL) 50000 units CAPS capsule     . ZETIA 10 MG tablet      Current Facility-Administered Medications on File Prior to Visit  Medication Dose Route Frequency Provider Last Rate Last Admin  . triamcinolone acetonide (KENALOG) 10 MG/ML injection 10 mg  10 mg Other Once Landis Martins, DPM      . triamcinolone acetonide (KENALOG) 10 MG/ML injection 10 mg  10 mg Other Once [place], Aadhya Bustamante, DPM      . triamcinolone acetonide (KENALOG) 10 MG/ML injection 10 mg  10 mg Other Once Landis Martins, DPM        Allergies  Allergen Reactions  . Pravastatin Sodium Other (See Comments)    Muscles  aches Muscles  aches   . Augmentin [Amoxicillin-Pot Clavulanate] Diarrhea    Objective: There were no vitals filed for this visit.  General: No acute distress, AAOx3  Right foot: Wound base measures 0.9 x 0.5x0.3 cm larger in nature at the first metatarsophalangeal joint at area of dehiscence and postoperative surgical wound at the first metatarsophalangeal joint on right with fibrotic slough slightly increased, increased blanchable erythema, no warmth, clear to bloody drainage, no malodor, no other acute signs of infection noted, capillary fill time <5 seconds in all digits, gross sensation present via light touch to right foot.  No pain with calf compression.    Assessment and Plan:  Problem List Items Addressed This Visit    None     Visit Diagnoses    Wound dehiscence    -  Primary   Relevant Medications   sulfamethoxazole-trimethoprim (BACTRIM) 400-80 MG tablet   Cellulitis and abscess of toe of right foot       S/P  foot surgery, right       Right foot pain           -Patient seen and evaluated -Cleansed ulceration at dorsal 1st ray on right to healthy bleeding borders removing nonviable tissue with a tissue nipper, patient tolerated debridement well without need for anesthesia. Wound measures post debridement as above.  -Wound culture obtained and started patient back on Bactrim will call patient if there is a change that is needed with her antibiotics -Applied silver nitrate and dry sterile dressing and instructed patient to do the same every other day -Exchanged flat shoe out for a wedge postop shoe and advised patient to use this to help keep pressure off her first toe -Advised patient to limit activity to necessity  -Advised patient to ice and elevate as necessary -Continue with as needed medications and ibuprofen as needed -Return in 1 week for continued wound care.  Landis Martins, DPM

## 2019-11-14 LAB — WOUND CULTURE

## 2019-11-16 ENCOUNTER — Encounter: Payer: Self-pay | Admitting: Sports Medicine

## 2019-11-16 ENCOUNTER — Ambulatory Visit: Payer: Medicare Other | Admitting: Sports Medicine

## 2019-11-16 ENCOUNTER — Other Ambulatory Visit: Payer: Self-pay

## 2019-11-16 DIAGNOSIS — T8130XA Disruption of wound, unspecified, initial encounter: Secondary | ICD-10-CM

## 2019-11-16 DIAGNOSIS — L02611 Cutaneous abscess of right foot: Secondary | ICD-10-CM | POA: Diagnosis not present

## 2019-11-16 DIAGNOSIS — M79671 Pain in right foot: Secondary | ICD-10-CM

## 2019-11-16 DIAGNOSIS — L03031 Cellulitis of right toe: Secondary | ICD-10-CM

## 2019-11-16 DIAGNOSIS — Z9889 Other specified postprocedural states: Secondary | ICD-10-CM

## 2019-11-16 NOTE — Progress Notes (Signed)
Subjective: Kaetlyn S Matassa is a 64 y.o. female patient seen today in office for POV # 10 (DOS 09/05/2019), S/P removal of hardware right foot and application of Oasis graft #2 applied 6 visits ago.  Patient reports that she has been changing the dressing using the silver sticks but thinks that it is looking worse with slight redness swelling yellow drainage and black drainage from the silver stick.  Patient denies calf pain, denies headache, chest pain, shortness of breath, nausea, vomiting, fever, or chills. No other issues noted.   Patient Active Problem List   Diagnosis Date Noted  . Recurrent right knee instability 12/31/2017  . Acute pain of left shoulder 08/13/2017  . 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 Knee pain, right 01/23/2014  . 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  . Unilateral primary osteoarthritis, right knee 05/26/2013  . Other specified postprocedural states 01/31/2013    Current Outpatient Medications on File Prior to Visit  Medication Sig Dispense Refill  . betamethasone acetate-betamethasone sodium phosphate (CELESTONE) 6 (3-3) MG/ML injection Inject 12 mg into the articular space.    . bupivacaine, PF, (MARCAINE) 0.25 % SOLN injection Inject 25 mg into the skin.    . carisoprodol (SOMA) 350 MG tablet Take 350 mg by mouth 3 (three) times daily.    . carisoprodol (SOMA) 350 MG tablet     . clindamycin (CLEOCIN) 150 MG capsule Take 1 capsule (150 mg total) by mouth 2 (two) times daily. 14 capsule 0  . clonazePAM (KLONOPIN) 0.5 MG tablet Take 0.5 mg by mouth at bedtime. Restless legs    . clonazePAM (KLONOPIN) 0.5 MG tablet     . clonazePAM (KLONOPIN) 1 MG tablet     . conjugated estrogens (PREMARIN) vaginal cream     . diclofenac (CATAFLAM) 50 MG tablet     . diclofenac (VOLTAREN) 50 MG EC tablet     . diclofenac sodium (VOLTAREN) 1 % GEL     . docusate sodium (COLACE) 100 MG capsule Take 1 capsule (100 mg total) by mouth 2 (two) times daily. 10 capsule 0  .  enoxaparin (LOVENOX) 40 MG/0.4ML injection Inject 0.4 mLs (40 mg total) into the skin daily. 12 Syringe 0  . escitalopram (LEXAPRO) 10 MG tablet     . furosemide (LASIX) 20 MG tablet Take 20 mg by mouth daily.    . furosemide (LASIX) 20 MG tablet     . GRALISE 600 MG TABS     . HYDROmorphone HCl (EXALGO) 12 MG T24A SR tablet     . Hylan 48 MG/6ML SOSY Inject 48 mg into the articular space.    . ibuprofen (ADVIL) 800 MG tablet Take 1 tablet (800 mg total) by mouth every 8 (eight) hours as needed. 30 tablet 0  . lamoTRIgine (LAMICTAL) 100 MG tablet     . Lifitegrast (XIIDRA) 5 % SOLN     . LINZESS 290 MCG CAPS capsule     . methocarbamol (ROBAXIN) 500 MG tablet Take 1-2 tablets (500-1,000 mg total) by mouth every 6 (six) hours as needed for muscle spasms. 60 tablet 2  . morphine (MS CONTIN) 30 MG 12 hr tablet Take by mouth.    . morphine (MS CONTIN) 60 MG 12 hr tablet Take 60 mg by mouth every 12 (twelve) hours.    . MOVANTIK 12.5 MG TABS     . NUVIGIL 150 MG tablet     . ondansetron (ZOFRAN) 4 MG tablet     . ondansetron (  ZOFRAN) 4 MG tablet     . oxycodone (ROXICODONE) 30 MG immediate release tablet Take 1 tablet (30 mg total) by mouth every 8 (eight) hours as needed (for breakthrough pain). 100 tablet 0  . oxyCODONE-acetaminophen (PERCOCET) 10-325 MG tablet     . Polyethyl Glycol-Propyl Glycol (SYSTANE OP) Apply 1 drop to eye 2 (two) times daily as needed (dry eyes).    . pramipexole (MIRAPEX) 0.125 MG tablet     . pravastatin (PRAVACHOL) 40 MG tablet Take 40 mg by mouth daily.    . pravastatin (PRAVACHOL) 40 MG tablet Take by mouth.    . PredniSONE 10 MG KIT Take as instructed for 12 days and a tapering dose 48 each 0  . pregabalin (LYRICA) 100 MG capsule Take by mouth daily.  0  . pregabalin (LYRICA) 150 MG capsule Take 150 mg by mouth daily.    . promethazine (PHENERGAN) 25 MG tablet Take 1 tablet (25 mg total) by mouth every 8 (eight) hours as needed for nausea or vomiting. 20 tablet  0  . sulfamethoxazole-trimethoprim (BACTRIM) 400-80 MG tablet Take 1 tablet by mouth 2 (two) times daily. 28 tablet 0  . Suvorexant (BELSOMRA) 15 MG TABS     . tiZANidine (ZANAFLEX) 4 MG tablet     . topiramate (TOPAMAX) 100 MG tablet Take 100 mg by mouth 2 (two) times daily.    Marland Kitchen topiramate (TOPAMAX) 50 MG tablet     . venlafaxine XR (EFFEXOR-XR) 150 MG 24 hr capsule Take 150 mg by mouth daily.    Marland Kitchen venlafaxine XR (EFFEXOR-XR) 150 MG 24 hr capsule     . Vitamin D, Ergocalciferol, (DRISDOL) 50000 units CAPS capsule     . ZETIA 10 MG tablet      Current Facility-Administered Medications on File Prior to Visit  Medication Dose Route Frequency Provider Last Rate Last Admin  . triamcinolone acetonide (KENALOG) 10 MG/ML injection 10 mg  10 mg Other Once Landis Martins, DPM      . triamcinolone acetonide (KENALOG) 10 MG/ML injection 10 mg  10 mg Other Once Troy, Latia Mataya, DPM      . triamcinolone acetonide (KENALOG) 10 MG/ML injection 10 mg  10 mg Other Once Landis Martins, DPM        Allergies  Allergen Reactions  . Pravastatin Sodium Other (See Comments)    Muscles  aches Muscles  aches   . Augmentin [Amoxicillin-Pot Clavulanate] Diarrhea    Objective: There were no vitals filed for this visit.  General: No acute distress, AAOx3  Right foot: Wound base measures 1.3 x 0.5x0.3 cm larger in nature at the first metatarsophalangeal joint at area of dehiscence and postoperative surgical wound at the first metatarsophalangeal joint on right with granular base, decreased blanchable erythema, no warmth, clear to bloody drainage, no malodor, no other acute signs of infection noted, capillary fill time <5 seconds in all digits, gross sensation present via light touch to right foot.  No pain with calf compression.    Assessment and Plan:  Problem List Items Addressed This Visit    None    Visit Diagnoses    Wound dehiscence    -  Primary   Cellulitis and abscess of toe of right foot        S/P foot surgery, right       Right foot pain           -Patient seen and evaluated -Cleansed ulceration at dorsal 1st ray on right to healthy bleeding  borders removing nonviable tissue with a tissue nipper, patient tolerated debridement well without need for anesthesia. Wound measures post debridement as above.  -Applied Prisma and dry sterile dressing and instructed patient to do the same every other day -Continue with wedge shoe -Advised patient to limit activity to necessity  -Advised patient to ice and elevate as necessary -Continue with as needed medications and ibuprofen as needed -Continue with Bactrim until completed for positive culture of staph -Return in 1 week for continued wound care.  Titorya Stover, DPM  

## 2019-11-17 DIAGNOSIS — M961 Postlaminectomy syndrome, not elsewhere classified: Secondary | ICD-10-CM | POA: Diagnosis not present

## 2019-11-17 DIAGNOSIS — G894 Chronic pain syndrome: Secondary | ICD-10-CM | POA: Diagnosis not present

## 2019-11-17 DIAGNOSIS — M545 Low back pain: Secondary | ICD-10-CM | POA: Diagnosis not present

## 2019-11-17 DIAGNOSIS — M79673 Pain in unspecified foot: Secondary | ICD-10-CM | POA: Diagnosis not present

## 2019-11-24 ENCOUNTER — Encounter: Payer: Self-pay | Admitting: Sports Medicine

## 2019-11-24 ENCOUNTER — Ambulatory Visit (INDEPENDENT_AMBULATORY_CARE_PROVIDER_SITE_OTHER): Payer: Medicare Other | Admitting: Sports Medicine

## 2019-11-24 ENCOUNTER — Other Ambulatory Visit: Payer: Self-pay

## 2019-11-24 DIAGNOSIS — L03031 Cellulitis of right toe: Secondary | ICD-10-CM

## 2019-11-24 DIAGNOSIS — L02611 Cutaneous abscess of right foot: Secondary | ICD-10-CM

## 2019-11-24 DIAGNOSIS — T8130XA Disruption of wound, unspecified, initial encounter: Secondary | ICD-10-CM

## 2019-11-24 DIAGNOSIS — Z9889 Other specified postprocedural states: Secondary | ICD-10-CM

## 2019-11-24 DIAGNOSIS — M79671 Pain in right foot: Secondary | ICD-10-CM

## 2019-11-24 MED ORDER — SULFAMETHOXAZOLE-TRIMETHOPRIM 400-80 MG PO TABS: 1.0000 | ORAL_TABLET | Freq: Two times a day (BID) | ORAL | 1 refills | Status: DC

## 2019-11-24 NOTE — Progress Notes (Signed)
Subjective: Sonya Allen is a 64 y.o. female patient seen today in office for POV # 11 (DOS 09/05/2019), S/P removal of hardware right foot and application of Oasis graft #2 applied 7 visits ago.  Patient reports that it seems to be doing a little bit better and there is decreased drainage, denies redness, denies swelling.  Patient denies calf pain, denies headache, chest pain, shortness of breath, nausea, vomiting, fever, or chills. No other issues noted.   Patient Active Problem List   Diagnosis Date Noted  . Recurrent right knee instability 12/31/2017  . Acute pain of left shoulder 08/13/2017  . 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 Knee pain, right 01/23/2014  . 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  . Unilateral primary osteoarthritis, right knee 05/26/2013  . Other specified postprocedural states 01/31/2013    Current Outpatient Medications on File Prior to Visit  Medication Sig Dispense Refill  . betamethasone acetate-betamethasone sodium phosphate (CELESTONE) 6 (3-3) MG/ML injection Inject 12 mg into the articular space.    . bupivacaine, PF, (MARCAINE) 0.25 % SOLN injection Inject 25 mg into the skin.    . carisoprodol (SOMA) 350 MG tablet Take 350 mg by mouth 3 (three) times daily.    . carisoprodol (SOMA) 350 MG tablet     . clindamycin (CLEOCIN) 150 MG capsule Take 1 capsule (150 mg total) by mouth 2 (two) times daily. 14 capsule 0  . clonazePAM (KLONOPIN) 0.5 MG tablet Take 0.5 mg by mouth at bedtime. Restless legs    . clonazePAM (KLONOPIN) 0.5 MG tablet     . clonazePAM (KLONOPIN) 1 MG tablet     . conjugated estrogens (PREMARIN) vaginal cream     . diclofenac (CATAFLAM) 50 MG tablet     . diclofenac (VOLTAREN) 50 MG EC tablet     . diclofenac sodium (VOLTAREN) 1 % GEL     . docusate sodium (COLACE) 100 MG capsule Take 1 capsule (100 mg total) by mouth 2 (two) times daily. 10 capsule 0  . enoxaparin (LOVENOX) 40 MG/0.4ML injection Inject 0.4 mLs (40 mg total)  into the skin daily. 12 Syringe 0  . escitalopram (LEXAPRO) 10 MG tablet     . furosemide (LASIX) 20 MG tablet Take 20 mg by mouth daily.    . furosemide (LASIX) 20 MG tablet     . GRALISE 600 MG TABS     . HYDROmorphone HCl (EXALGO) 12 MG T24A SR tablet     . Hylan 48 MG/6ML SOSY Inject 48 mg into the articular space.    Marland Kitchen ibuprofen (ADVIL) 800 MG tablet Take 1 tablet (800 mg total) by mouth every 8 (eight) hours as needed. 30 tablet 0  . lamoTRIgine (LAMICTAL) 100 MG tablet     . Lifitegrast (XIIDRA) 5 % SOLN     . LINZESS 290 MCG CAPS capsule     . methocarbamol (ROBAXIN) 500 MG tablet Take 1-2 tablets (500-1,000 mg total) by mouth every 6 (six) hours as needed for muscle spasms. 60 tablet 2  . morphine (MS CONTIN) 30 MG 12 hr tablet Take by mouth.    . morphine (MS CONTIN) 60 MG 12 hr tablet Take 60 mg by mouth every 12 (twelve) hours.    Marland Kitchen MOVANTIK 12.5 MG TABS     . NUVIGIL 150 MG tablet     . ondansetron (ZOFRAN) 4 MG tablet     . ondansetron (ZOFRAN) 4 MG tablet     . oxycodone (ROXICODONE) 30  MG immediate release tablet Take 1 tablet (30 mg total) by mouth every 8 (eight) hours as needed (for breakthrough pain). 100 tablet 0  . oxyCODONE-acetaminophen (PERCOCET) 10-325 MG tablet     . Polyethyl Glycol-Propyl Glycol (SYSTANE OP) Apply 1 drop to eye 2 (two) times daily as needed (dry eyes).    . pramipexole (MIRAPEX) 0.125 MG tablet     . pravastatin (PRAVACHOL) 40 MG tablet Take 40 mg by mouth daily.    . pravastatin (PRAVACHOL) 40 MG tablet Take by mouth.    . PredniSONE 10 MG KIT Take as instructed for 12 days and a tapering dose 48 each 0  . pregabalin (LYRICA) 100 MG capsule Take by mouth daily.  0  . pregabalin (LYRICA) 150 MG capsule Take 150 mg by mouth daily.    . promethazine (PHENERGAN) 25 MG tablet Take 1 tablet (25 mg total) by mouth every 8 (eight) hours as needed for nausea or vomiting. 20 tablet 0  . Suvorexant (BELSOMRA) 15 MG TABS     . tiZANidine (ZANAFLEX) 4 MG  tablet     . topiramate (TOPAMAX) 100 MG tablet Take 100 mg by mouth 2 (two) times daily.    Marland Kitchen topiramate (TOPAMAX) 50 MG tablet     . venlafaxine XR (EFFEXOR-XR) 150 MG 24 hr capsule Take 150 mg by mouth daily.    Marland Kitchen venlafaxine XR (EFFEXOR-XR) 150 MG 24 hr capsule     . Vitamin D, Ergocalciferol, (DRISDOL) 50000 units CAPS capsule     . ZETIA 10 MG tablet      Current Facility-Administered Medications on File Prior to Visit  Medication Dose Route Frequency Provider Last Rate Last Admin  . triamcinolone acetonide (KENALOG) 10 MG/ML injection 10 mg  10 mg Other Once Landis Martins, DPM      . triamcinolone acetonide (KENALOG) 10 MG/ML injection 10 mg  10 mg Other Once Gillis, Miklos Bidinger, DPM      . triamcinolone acetonide (KENALOG) 10 MG/ML injection 10 mg  10 mg Other Once Landis Martins, DPM        Allergies  Allergen Reactions  . Pravastatin Sodium Other (See Comments)    Muscles  aches Muscles  aches   . Augmentin [Amoxicillin-Pot Clavulanate] Diarrhea    Objective: There were no vitals filed for this visit.  General: No acute distress, AAOx3  Right foot: Wound base measures 0.6 x 0.4x0.3 cm smaller in nature at the first metatarsophalangeal joint at area of dehiscence and postoperative surgical wound at the first metatarsophalangeal joint on right with granular base, decreased blanchable erythema, no warmth, minimal clear to bloody drainage, no malodor, no other acute signs of infection noted, capillary fill time <5 seconds in all digits, gross sensation present via light touch to right foot.  No pain with calf compression.    Assessment and Plan:  Problem List Items Addressed This Visit    None    Visit Diagnoses    Cellulitis and abscess of toe of right foot    -  Primary   Wound dehiscence       Relevant Medications   sulfamethoxazole-trimethoprim (BACTRIM) 400-80 MG tablet   S/P foot surgery, right       Right foot pain           -Patient seen and  evaluated -Cleansed ulceration at dorsal 1st ray on right to healthy bleeding borders removing nonviable tissue with a tissue nipper, patient tolerated debridement well without need for anesthesia. Wound measures post  debridement as above.  -Applied Prisma and dry sterile dressing and instructed patient to do the same every other day like before until the area has healed -Continue with wedge shoe but may slowly try croc or shoe that does not rub the wound site -Advised patient to limit activity to necessity  -Advised patient to ice and elevate as necessary -Continue with as needed medications and ibuprofen as needed -Continue with Bactrim until completed for positive culture of staph which was refilled this visit patient seemed to do better when we have her on ongoing antibiotics -Return in 2 weeks for continued wound care.  Landis Martins, DPM

## 2019-12-03 ENCOUNTER — Encounter: Payer: Self-pay | Admitting: Sports Medicine

## 2019-12-14 ENCOUNTER — Encounter: Payer: Self-pay | Admitting: Sports Medicine

## 2019-12-14 ENCOUNTER — Ambulatory Visit (INDEPENDENT_AMBULATORY_CARE_PROVIDER_SITE_OTHER): Payer: Medicare Other | Admitting: Sports Medicine

## 2019-12-14 ENCOUNTER — Other Ambulatory Visit: Payer: Self-pay

## 2019-12-14 DIAGNOSIS — L03031 Cellulitis of right toe: Secondary | ICD-10-CM | POA: Diagnosis not present

## 2019-12-14 DIAGNOSIS — L02611 Cutaneous abscess of right foot: Secondary | ICD-10-CM | POA: Diagnosis not present

## 2019-12-14 DIAGNOSIS — T8130XA Disruption of wound, unspecified, initial encounter: Secondary | ICD-10-CM | POA: Diagnosis not present

## 2019-12-14 DIAGNOSIS — Z9889 Other specified postprocedural states: Secondary | ICD-10-CM

## 2019-12-14 DIAGNOSIS — M79671 Pain in right foot: Secondary | ICD-10-CM

## 2019-12-14 MED ORDER — SULFAMETHOXAZOLE-TRIMETHOPRIM 400-80 MG PO TABS: 1.0000 | ORAL_TABLET | Freq: Two times a day (BID) | ORAL | 1 refills | Status: DC

## 2019-12-14 NOTE — Progress Notes (Signed)
Subjective: Sonya Allen is a 64 y.o. female patient seen today in office for POV # 12(DOS 09/05/2019), S/P removal of hardware right foot and application of Oasis graft #2 applied 8 visits ago.  Patient reports that she went out of town and her foot swelled up a lot and now the wound looks more open with some redness and drainage reports that she has been wearing her surgical shoe applying Prisma and taking Motrin if needed for pain however patient reports that it is not too uncomfortable.  Patient denies calf pain, denies headache, chest pain, shortness of breath, nausea, vomiting, fever, or chills. No other issues noted.   Patient Active Problem List   Diagnosis Date Noted  . Recurrent right knee instability 12/31/2017  . Acute pain of left shoulder 08/13/2017  . 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 Knee pain, right 01/23/2014  . 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  . Unilateral primary osteoarthritis, right knee 05/26/2013  . Other specified postprocedural states 01/31/2013    Current Outpatient Medications on File Prior to Visit  Medication Sig Dispense Refill  . betamethasone acetate-betamethasone sodium phosphate (CELESTONE) 6 (3-3) MG/ML injection Inject 12 mg into the articular space.    . bupivacaine, PF, (MARCAINE) 0.25 % SOLN injection Inject 25 mg into the skin.    . carisoprodol (SOMA) 350 MG tablet Take 350 mg by mouth 3 (three) times daily.    . carisoprodol (SOMA) 350 MG tablet     . clindamycin (CLEOCIN) 150 MG capsule Take 1 capsule (150 mg total) by mouth 2 (two) times daily. 14 capsule 0  . clonazePAM (KLONOPIN) 0.5 MG tablet Take 0.5 mg by mouth at bedtime. Restless legs    . clonazePAM (KLONOPIN) 0.5 MG tablet     . clonazePAM (KLONOPIN) 1 MG tablet     . conjugated estrogens (PREMARIN) vaginal cream     . diclofenac (CATAFLAM) 50 MG tablet     . diclofenac (VOLTAREN) 50 MG EC tablet     . diclofenac sodium (VOLTAREN) 1 % GEL     . docusate sodium  (COLACE) 100 MG capsule Take 1 capsule (100 mg total) by mouth 2 (two) times daily. 10 capsule 0  . enoxaparin (LOVENOX) 40 MG/0.4ML injection Inject 0.4 mLs (40 mg total) into the skin daily. 12 Syringe 0  . escitalopram (LEXAPRO) 10 MG tablet     . furosemide (LASIX) 20 MG tablet Take 20 mg by mouth daily.    . furosemide (LASIX) 20 MG tablet     . GRALISE 600 MG TABS     . HYDROmorphone HCl (EXALGO) 12 MG T24A SR tablet     . Hylan 48 MG/6ML SOSY Inject 48 mg into the articular space.    Marland Kitchen ibuprofen (ADVIL) 800 MG tablet Take 1 tablet (800 mg total) by mouth every 8 (eight) hours as needed. 30 tablet 0  . lamoTRIgine (LAMICTAL) 100 MG tablet     . Lifitegrast (XIIDRA) 5 % SOLN     . LINZESS 290 MCG CAPS capsule     . methocarbamol (ROBAXIN) 500 MG tablet Take 1-2 tablets (500-1,000 mg total) by mouth every 6 (six) hours as needed for muscle spasms. 60 tablet 2  . morphine (MS CONTIN) 30 MG 12 hr tablet Take by mouth.    . morphine (MS CONTIN) 60 MG 12 hr tablet Take 60 mg by mouth every 12 (twelve) hours.    Marland Kitchen MOVANTIK 12.5 MG TABS     .  NUVIGIL 150 MG tablet     . ondansetron (ZOFRAN) 4 MG tablet     . ondansetron (ZOFRAN) 4 MG tablet     . oxycodone (ROXICODONE) 30 MG immediate release tablet Take 1 tablet (30 mg total) by mouth every 8 (eight) hours as needed (for breakthrough pain). 100 tablet 0  . oxyCODONE-acetaminophen (PERCOCET) 10-325 MG tablet     . Polyethyl Glycol-Propyl Glycol (SYSTANE OP) Apply 1 drop to eye 2 (two) times daily as needed (dry eyes).    . pramipexole (MIRAPEX) 0.125 MG tablet     . pravastatin (PRAVACHOL) 40 MG tablet Take 40 mg by mouth daily.    . pravastatin (PRAVACHOL) 40 MG tablet Take by mouth.    . PredniSONE 10 MG KIT Take as instructed for 12 days and a tapering dose 48 each 0  . pregabalin (LYRICA) 100 MG capsule Take by mouth daily.  0  . pregabalin (LYRICA) 150 MG capsule Take 150 mg by mouth daily.    . promethazine (PHENERGAN) 25 MG tablet  Take 1 tablet (25 mg total) by mouth every 8 (eight) hours as needed for nausea or vomiting. 20 tablet 0  . Suvorexant (BELSOMRA) 15 MG TABS     . tiZANidine (ZANAFLEX) 4 MG tablet     . topiramate (TOPAMAX) 100 MG tablet Take 100 mg by mouth 2 (two) times daily.    Marland Kitchen topiramate (TOPAMAX) 50 MG tablet     . venlafaxine XR (EFFEXOR-XR) 150 MG 24 hr capsule Take 150 mg by mouth daily.    Marland Kitchen venlafaxine XR (EFFEXOR-XR) 150 MG 24 hr capsule     . Vitamin D, Ergocalciferol, (DRISDOL) 50000 units CAPS capsule     . ZETIA 10 MG tablet      Current Facility-Administered Medications on File Prior to Visit  Medication Dose Route Frequency Provider Last Rate Last Admin  . triamcinolone acetonide (KENALOG) 10 MG/ML injection 10 mg  10 mg Other Once Landis Martins, DPM      . triamcinolone acetonide (KENALOG) 10 MG/ML injection 10 mg  10 mg Other Once Albertson, Caelynn Marshman, DPM      . triamcinolone acetonide (KENALOG) 10 MG/ML injection 10 mg  10 mg Other Once Landis Martins, DPM        Allergies  Allergen Reactions  . Pravastatin Sodium Other (See Comments)    Muscles  aches Muscles  aches   . Augmentin [Amoxicillin-Pot Clavulanate] Diarrhea    Objective: There were no vitals filed for this visit.  General: No acute distress, AAOx3  Right foot: Wound base measures 2 x 1 x 0.4 with fatty tissue and now tendon exposure at the first metatarsophalangeal joint at area of dehiscence and postoperative surgical wound at the first metatarsophalangeal joint on right with granular base, focal erythema, focal edema, no warmth, bloody drainage, no malodor, no other acute signs of infection noted, capillary fill time <5 seconds in all digits, gross sensation present via light touch to right foot.  No pain with calf compression.    Assessment and Plan:  Problem List Items Addressed This Visit    None    Visit Diagnoses    Wound dehiscence    -  Primary   Relevant Medications   sulfamethoxazole-trimethoprim  (BACTRIM) 400-80 MG tablet   Other Relevant Orders   WOUND CULTURE   Cellulitis and abscess of toe of right foot       S/P foot surgery, right       Right foot pain           -  Patient seen and evaluated -Cleansed ulceration at dorsal 1st ray on right to healthy bleeding borders removing nonviable tissue with a dermal curette, patient tolerated debridement well without need for anesthesia. Wound measures post debridement as above.  -Wound culture obtained will call patient if there is a need for change in antibiotics -Started patient preemptively on Bactrim -Applied Prisma and dry sterile dressing and instructed patient to do the same every other day like before until the area has healed -Continue with wedge shoe  -Advised patient to limit activity to necessity  -Advised patient elevation and Motrin as necessary -Return in 1 week for continued wound care and x-rays.  Advised patient if her wound is doing much better and her wound cultures are negative we can resume with possible use of Oasis to the area to help with healing.  Landis Martins, DPM

## 2019-12-16 DIAGNOSIS — M1710 Unilateral primary osteoarthritis, unspecified knee: Secondary | ICD-10-CM | POA: Diagnosis not present

## 2019-12-16 DIAGNOSIS — G894 Chronic pain syndrome: Secondary | ICD-10-CM | POA: Diagnosis not present

## 2019-12-16 DIAGNOSIS — M961 Postlaminectomy syndrome, not elsewhere classified: Secondary | ICD-10-CM | POA: Diagnosis not present

## 2019-12-16 DIAGNOSIS — M79673 Pain in unspecified foot: Secondary | ICD-10-CM | POA: Diagnosis not present

## 2019-12-16 LAB — WOUND CULTURE: Organism ID, Bacteria: NONE SEEN

## 2019-12-22 ENCOUNTER — Ambulatory Visit (INDEPENDENT_AMBULATORY_CARE_PROVIDER_SITE_OTHER): Payer: Medicare Other

## 2019-12-22 ENCOUNTER — Ambulatory Visit: Payer: Medicare Other | Admitting: Sports Medicine

## 2019-12-22 ENCOUNTER — Other Ambulatory Visit: Payer: Self-pay

## 2019-12-22 ENCOUNTER — Encounter: Payer: Self-pay | Admitting: Sports Medicine

## 2019-12-22 DIAGNOSIS — Z9889 Other specified postprocedural states: Secondary | ICD-10-CM

## 2019-12-22 DIAGNOSIS — T8484XA Pain due to internal orthopedic prosthetic devices, implants and grafts, initial encounter: Secondary | ICD-10-CM

## 2019-12-22 DIAGNOSIS — L02611 Cutaneous abscess of right foot: Secondary | ICD-10-CM

## 2019-12-22 DIAGNOSIS — T8130XA Disruption of wound, unspecified, initial encounter: Secondary | ICD-10-CM

## 2019-12-22 DIAGNOSIS — T148XXA Other injury of unspecified body region, initial encounter: Secondary | ICD-10-CM | POA: Diagnosis not present

## 2019-12-22 DIAGNOSIS — L03031 Cellulitis of right toe: Secondary | ICD-10-CM | POA: Diagnosis not present

## 2019-12-22 DIAGNOSIS — M79671 Pain in right foot: Secondary | ICD-10-CM

## 2019-12-22 NOTE — Progress Notes (Signed)
Subjective: Sonya Allen is a 64 y.o. female patient seen today in office for POV # 13(DOS 09/05/2019), S/P removal of hardware right foot and application of Oasis graft #2 applied 9 visits ago.  Patient reports that it is about the same, it runs/drains with same swelling using prisma. Minimal pain 3/10.  Patient denies calf pain, denies headache, chest pain, shortness of breath, nausea, vomiting, fever, or chills. No other issues noted.   Patient Active Problem List   Diagnosis Date Noted  . Recurrent right knee instability 12/31/2017  . Acute pain of left shoulder 08/13/2017  . 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 Knee pain, right 01/23/2014  . 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  . Unilateral primary osteoarthritis, right knee 05/26/2013  . Other specified postprocedural states 01/31/2013    Current Outpatient Medications on File Prior to Visit  Medication Sig Dispense Refill  . betamethasone acetate-betamethasone sodium phosphate (CELESTONE) 6 (3-3) MG/ML injection Inject 12 mg into the articular space.    . bupivacaine, PF, (MARCAINE) 0.25 % SOLN injection Inject 25 mg into the skin.    . carisoprodol (SOMA) 350 MG tablet Take 350 mg by mouth 3 (three) times daily.    . carisoprodol (SOMA) 350 MG tablet     . clindamycin (CLEOCIN) 150 MG capsule Take 1 capsule (150 mg total) by mouth 2 (two) times daily. 14 capsule 0  . clonazePAM (KLONOPIN) 0.5 MG tablet Take 0.5 mg by mouth at bedtime. Restless legs    . clonazePAM (KLONOPIN) 0.5 MG tablet     . clonazePAM (KLONOPIN) 1 MG tablet     . conjugated estrogens (PREMARIN) vaginal cream     . diclofenac (CATAFLAM) 50 MG tablet     . diclofenac (VOLTAREN) 50 MG EC tablet     . diclofenac sodium (VOLTAREN) 1 % GEL     . docusate sodium (COLACE) 100 MG capsule Take 1 capsule (100 mg total) by mouth 2 (two) times daily. 10 capsule 0  . enoxaparin (LOVENOX) 40 MG/0.4ML injection Inject 0.4 mLs (40 mg total) into the skin daily.  12 Syringe 0  . escitalopram (LEXAPRO) 10 MG tablet     . furosemide (LASIX) 20 MG tablet Take 20 mg by mouth daily.    . furosemide (LASIX) 20 MG tablet     . GRALISE 600 MG TABS     . HYDROmorphone HCl (EXALGO) 12 MG T24A SR tablet     . Hylan 48 MG/6ML SOSY Inject 48 mg into the articular space.    Marland Kitchen ibuprofen (ADVIL) 800 MG tablet Take 1 tablet (800 mg total) by mouth every 8 (eight) hours as needed. 30 tablet 0  . lamoTRIgine (LAMICTAL) 100 MG tablet     . Lifitegrast (XIIDRA) 5 % SOLN     . LINZESS 290 MCG CAPS capsule     . methocarbamol (ROBAXIN) 500 MG tablet Take 1-2 tablets (500-1,000 mg total) by mouth every 6 (six) hours as needed for muscle spasms. 60 tablet 2  . morphine (MS CONTIN) 30 MG 12 hr tablet Take by mouth.    . morphine (MS CONTIN) 60 MG 12 hr tablet Take 60 mg by mouth every 12 (twelve) hours.    Marland Kitchen MOVANTIK 12.5 MG TABS     . NUVIGIL 150 MG tablet     . ondansetron (ZOFRAN) 4 MG tablet     . ondansetron (ZOFRAN) 4 MG tablet     . oxycodone (ROXICODONE) 30 MG immediate release tablet  Take 1 tablet (30 mg total) by mouth every 8 (eight) hours as needed (for breakthrough pain). 100 tablet 0  . oxyCODONE-acetaminophen (PERCOCET) 10-325 MG tablet     . Polyethyl Glycol-Propyl Glycol (SYSTANE OP) Apply 1 drop to eye 2 (two) times daily as needed (dry eyes).    . pramipexole (MIRAPEX) 0.125 MG tablet     . pravastatin (PRAVACHOL) 40 MG tablet Take 40 mg by mouth daily.    . pravastatin (PRAVACHOL) 40 MG tablet Take by mouth.    . PredniSONE 10 MG KIT Take as instructed for 12 days and a tapering dose 48 each 0  . pregabalin (LYRICA) 100 MG capsule Take by mouth daily.  0  . pregabalin (LYRICA) 150 MG capsule Take 150 mg by mouth daily.    . promethazine (PHENERGAN) 25 MG tablet Take 1 tablet (25 mg total) by mouth every 8 (eight) hours as needed for nausea or vomiting. 20 tablet 0  . sulfamethoxazole-trimethoprim (BACTRIM) 400-80 MG tablet Take 1 tablet by mouth 2 (two)  times daily. 28 tablet 1  . Suvorexant (BELSOMRA) 15 MG TABS     . tiZANidine (ZANAFLEX) 4 MG tablet     . topiramate (TOPAMAX) 100 MG tablet Take 100 mg by mouth 2 (two) times daily.    Marland Kitchen topiramate (TOPAMAX) 50 MG tablet     . venlafaxine XR (EFFEXOR-XR) 150 MG 24 hr capsule Take 150 mg by mouth daily.    Marland Kitchen venlafaxine XR (EFFEXOR-XR) 150 MG 24 hr capsule     . Vitamin D, Ergocalciferol, (DRISDOL) 50000 units CAPS capsule     . ZETIA 10 MG tablet      Current Facility-Administered Medications on File Prior to Visit  Medication Dose Route Frequency Provider Last Rate Last Admin  . triamcinolone acetonide (KENALOG) 10 MG/ML injection 10 mg  10 mg Other Once Landis Martins, DPM      . triamcinolone acetonide (KENALOG) 10 MG/ML injection 10 mg  10 mg Other Once [place], Sherae Santino, DPM      . triamcinolone acetonide (KENALOG) 10 MG/ML injection 10 mg  10 mg Other Once Landis Martins, DPM        Allergies  Allergen Reactions  . Pravastatin Sodium Other (See Comments)    Muscles  aches Muscles  aches   . Augmentin [Amoxicillin-Pot Clavulanate] Diarrhea    Objective: There were no vitals filed for this visit.  General: No acute distress, AAOx3  Right foot: Wound base measures 1.5 x 1 x 0.4 with fatty tissue, no more tendon exposure at the first metatarsophalangeal joint at area of dehiscence and postoperative surgical wound at the first metatarsophalangeal joint on right with granular base, focal erythema, focal edema, no warmth, bloody drainage, no malodor, no other acute signs of infection noted, capillary fill time <5 seconds in all digits, gross sensation present via light touch to right foot.  No pain with calf compression.   xrays as below  Assessment and Plan:  Problem List Items Addressed This Visit    None    Visit Diagnoses    S/P foot surgery, right    -  Primary   Relevant Orders   DG Foot Complete Right   Wound dehiscence       Cellulitis and abscess of toe of right  foot       Right foot pain       Painful orthopaedic hardware [facility])           -Patient seen and evaluated -  Xrays reviewed, no signs of osteomyelitis but there is dorsal migration of implant at 1st MTPJ -Cleansed ulceration at dorsal 1st ray on right to healthy bleeding borders removing nonviable tissue with a dermal curette, patient tolerated debridement well without need for anesthesia. Wound measures post debridement as above.  -Continue with Bactrim -Applied Iodosorb and dry sterile dressing and instructed patient to do the same every other day like before  -Continue with wedge shoe to prevent rubbing at 1st toe -Advised patient to limit activity to necessity  -Advised patient elevation and Motrin as necessary -Return in 1 week for continued wound care and possible repeat xrays if  Wound is still the same size, Advised patient if her wound is doing much better after finishing antibiotics we can start grafting again/oasis  Landis Martins, DPM

## 2019-12-28 ENCOUNTER — Other Ambulatory Visit: Payer: Self-pay | Admitting: Sports Medicine

## 2019-12-28 ENCOUNTER — Telehealth: Payer: Self-pay

## 2019-12-28 DIAGNOSIS — T8130XA Disruption of wound, unspecified, initial encounter: Secondary | ICD-10-CM

## 2019-12-28 MED ORDER — SULFAMETHOXAZOLE-TRIMETHOPRIM 400-80 MG PO TABS: 1.0000 | ORAL_TABLET | Freq: Two times a day (BID) | ORAL | 1 refills | Status: DC

## 2019-12-28 NOTE — Telephone Encounter (Signed)
Pt. Informed of RF

## 2019-12-28 NOTE — Telephone Encounter (Signed)
Sent!

## 2019-12-28 NOTE — Progress Notes (Signed)
Refilled Bactrim

## 2019-12-28 NOTE — Telephone Encounter (Signed)
Pt called rquesting a RF on abx

## 2019-12-29 ENCOUNTER — Ambulatory Visit: Payer: Medicare Other | Admitting: Sports Medicine

## 2019-12-30 ENCOUNTER — Other Ambulatory Visit: Payer: Self-pay | Admitting: Sports Medicine

## 2019-12-30 ENCOUNTER — Other Ambulatory Visit: Payer: Self-pay

## 2019-12-30 ENCOUNTER — Encounter: Payer: Self-pay | Admitting: Sports Medicine

## 2019-12-30 ENCOUNTER — Ambulatory Visit: Payer: Medicare Other | Admitting: Sports Medicine

## 2019-12-30 DIAGNOSIS — T84498A Other mechanical complication of other internal orthopedic devices, implants and grafts, initial encounter: Secondary | ICD-10-CM | POA: Diagnosis not present

## 2019-12-30 DIAGNOSIS — T148XXA Other injury of unspecified body region, initial encounter: Secondary | ICD-10-CM

## 2019-12-30 DIAGNOSIS — M79671 Pain in right foot: Secondary | ICD-10-CM

## 2019-12-30 DIAGNOSIS — T8130XA Disruption of wound, unspecified, initial encounter: Secondary | ICD-10-CM

## 2019-12-30 DIAGNOSIS — L03031 Cellulitis of right toe: Secondary | ICD-10-CM

## 2019-12-30 DIAGNOSIS — Z9889 Other specified postprocedural states: Secondary | ICD-10-CM

## 2019-12-30 DIAGNOSIS — L02611 Cutaneous abscess of right foot: Secondary | ICD-10-CM

## 2019-12-30 NOTE — Progress Notes (Signed)
Subjective: Sonya Allen is a 64 y.o. female patient seen today in office for POV # 14 (DOS 09/05/2019), S/P removal of hardware right foot and application of Oasis graft #2 applied 10 visits ago.  Patient reports that it seems like its getting better,has been using iodosorb to area daily. Minimal pain 2-3/10.  Patient denies calf pain, denies headache, chest pain, shortness of breath, nausea, vomiting, fever, or chills. No other issues noted.   Patient Active Problem List   Diagnosis Date Noted  . Recurrent right knee instability 12/31/2017  . Acute pain of left shoulder 08/13/2017  . 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 Knee pain, right 01/23/2014  . 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  . Unilateral primary osteoarthritis, right knee 05/26/2013  . Other specified postprocedural states 01/31/2013    Current Outpatient Medications on File Prior to Visit  Medication Sig Dispense Refill  . betamethasone acetate-betamethasone sodium phosphate (CELESTONE) 6 (3-3) MG/ML injection Inject 12 mg into the articular space.    . bupivacaine, PF, (MARCAINE) 0.25 % SOLN injection Inject 25 mg into the skin.    . carisoprodol (SOMA) 350 MG tablet Take 350 mg by mouth 3 (three) times daily.    . carisoprodol (SOMA) 350 MG tablet     . clindamycin (CLEOCIN) 150 MG capsule Take 1 capsule (150 mg total) by mouth 2 (two) times daily. 14 capsule 0  . clonazePAM (KLONOPIN) 0.5 MG tablet Take 0.5 mg by mouth at bedtime. Restless legs    . clonazePAM (KLONOPIN) 0.5 MG tablet     . clonazePAM (KLONOPIN) 1 MG tablet     . conjugated estrogens (PREMARIN) vaginal cream     . diclofenac (CATAFLAM) 50 MG tablet     . diclofenac (VOLTAREN) 50 MG EC tablet     . diclofenac sodium (VOLTAREN) 1 % GEL     . docusate sodium (COLACE) 100 MG capsule Take 1 capsule (100 mg total) by mouth 2 (two) times daily. 10 capsule 0  . enoxaparin (LOVENOX) 40 MG/0.4ML injection Inject 0.4 mLs (40 mg total) into the skin  daily. 12 Syringe 0  . escitalopram (LEXAPRO) 10 MG tablet     . furosemide (LASIX) 20 MG tablet Take 20 mg by mouth daily.    . furosemide (LASIX) 20 MG tablet     . GRALISE 600 MG TABS     . HYDROmorphone HCl (EXALGO) 12 MG T24A SR tablet     . Hylan 48 MG/6ML SOSY Inject 48 mg into the articular space.    Marland Kitchen ibuprofen (ADVIL) 800 MG tablet Take 1 tablet (800 mg total) by mouth every 8 (eight) hours as needed. 30 tablet 0  . lamoTRIgine (LAMICTAL) 100 MG tablet     . Lifitegrast (XIIDRA) 5 % SOLN     . LINZESS 290 MCG CAPS capsule     . methocarbamol (ROBAXIN) 500 MG tablet Take 1-2 tablets (500-1,000 mg total) by mouth every 6 (six) hours as needed for muscle spasms. 60 tablet 2  . morphine (MS CONTIN) 30 MG 12 hr tablet Take by mouth.    . morphine (MS CONTIN) 60 MG 12 hr tablet Take 60 mg by mouth every 12 (twelve) hours.    Marland Kitchen MOVANTIK 12.5 MG TABS     . NUVIGIL 150 MG tablet     . ondansetron (ZOFRAN) 4 MG tablet     . ondansetron (ZOFRAN) 4 MG tablet     . oxycodone (ROXICODONE) 30 MG immediate release  tablet Take 1 tablet (30 mg total) by mouth every 8 (eight) hours as needed (for breakthrough pain). 100 tablet 0  . oxyCODONE-acetaminophen (PERCOCET) 10-325 MG tablet     . Polyethyl Glycol-Propyl Glycol (SYSTANE OP) Apply 1 drop to eye 2 (two) times daily as needed (dry eyes).    . pramipexole (MIRAPEX) 0.125 MG tablet     . pravastatin (PRAVACHOL) 40 MG tablet Take 40 mg by mouth daily.    . pravastatin (PRAVACHOL) 40 MG tablet Take by mouth.    . PredniSONE 10 MG KIT Take as instructed for 12 days and a tapering dose 48 each 0  . pregabalin (LYRICA) 100 MG capsule Take by mouth daily.  0  . pregabalin (LYRICA) 150 MG capsule Take 150 mg by mouth daily.    . promethazine (PHENERGAN) 25 MG tablet Take 1 tablet (25 mg total) by mouth every 8 (eight) hours as needed for nausea or vomiting. 20 tablet 0  . sulfamethoxazole-trimethoprim (BACTRIM) 400-80 MG tablet Take 1 tablet by mouth  2 (two) times daily. 28 tablet 1  . Suvorexant (BELSOMRA) 15 MG TABS     . tiZANidine (ZANAFLEX) 4 MG tablet     . topiramate (TOPAMAX) 100 MG tablet Take 100 mg by mouth 2 (two) times daily.    Marland Kitchen topiramate (TOPAMAX) 50 MG tablet     . venlafaxine XR (EFFEXOR-XR) 150 MG 24 hr capsule Take 150 mg by mouth daily.    Marland Kitchen venlafaxine XR (EFFEXOR-XR) 150 MG 24 hr capsule     . Vitamin D, Ergocalciferol, (DRISDOL) 50000 units CAPS capsule     . ZETIA 10 MG tablet      Current Facility-Administered Medications on File Prior to Visit  Medication Dose Route Frequency Provider Last Rate Last Admin  . triamcinolone acetonide (KENALOG) 10 MG/ML injection 10 mg  10 mg Other Once Landis Martins, DPM      . triamcinolone acetonide (KENALOG) 10 MG/ML injection 10 mg  10 mg Other Once [place], Lalaine Overstreet, DPM      . triamcinolone acetonide (KENALOG) 10 MG/ML injection 10 mg  10 mg Other Once Landis Martins, DPM        Allergies  Allergen Reactions  . Pravastatin Sodium Other (See Comments)    Muscles  aches Muscles  aches   . Augmentin [Amoxicillin-Pot Clavulanate] Diarrhea    Objective: There were no vitals filed for this visit.  General: No acute distress, AAOx3  Right foot: Wound base measures 1.5 x 1 x 0.1 with now hardware exposed upon removal there is exposed deeper fatty tissue and depth to the wound 0.6cm at the area of implant defecit at the first metatarsophalangeal joint at area of dehiscence and postoperative surgical wound at the first metatarsophalangeal joint on right. bloody drainage, no malodor, no other acute signs of infection noted, capillary fill time <5 seconds in all digits, gross sensation present via light touch to right foot.  No pain with calf compression.   Assessment and Plan:  Problem List Items Addressed This Visit    None    Visit Diagnoses    Exposed orthopaedic hardware ([place])    -  Primary   Wound dehiscence       S/P foot surgery, right       Cellulitis and  abscess of toe of right foot       Right foot pain           -Patient seen and evaluated -Discussed treatment options with patient  for now exposed implant at the first MTPJ, right foot -After written and verbal consent was obtained for removal of hardware in office the area was anesthetized utilizing 6 cc of one-to-one mixture 1% lidocaine plain in a local field block fashion, anesthesia was confirmed and then the area was then sterilely prepped using Betadine.  At the exposed wound bed a hemostat was then used to remove the exposed implant from the wound bed and then the area was flushed with saline and packed open utilizing quarter inch plain packing and dressed with dry dressing.  Patient tolerated the implant removal procedure well hemostasis was achieved with manual pressure there were no complications. -Patient was instructed to leave dressing clean dry and intact for the day and on tomorrow may replace/change dressing utilizing iodoform packing.  -Continue with Bactrim -Continue with wedge offloading postoperative shoe -Advised patient to limit activity to necessity  -Advised patient elevation and Motrin as necessary for additional pain relief if pain worsens over the weekend advised patient to call office/on-call physician -Return in 1 week for continued wound care and x-rays.  At this visit we will also discuss patient upcoming trip to the beach.   Landis Martins, DPM

## 2020-01-06 ENCOUNTER — Other Ambulatory Visit: Payer: Self-pay

## 2020-01-06 ENCOUNTER — Other Ambulatory Visit: Payer: Self-pay | Admitting: Sports Medicine

## 2020-01-06 ENCOUNTER — Ambulatory Visit (INDEPENDENT_AMBULATORY_CARE_PROVIDER_SITE_OTHER): Payer: Medicare Other | Admitting: Sports Medicine

## 2020-01-06 ENCOUNTER — Ambulatory Visit (INDEPENDENT_AMBULATORY_CARE_PROVIDER_SITE_OTHER): Payer: Medicare Other

## 2020-01-06 ENCOUNTER — Encounter: Payer: Self-pay | Admitting: Sports Medicine

## 2020-01-06 DIAGNOSIS — Z9889 Other specified postprocedural states: Secondary | ICD-10-CM

## 2020-01-06 DIAGNOSIS — M79671 Pain in right foot: Secondary | ICD-10-CM

## 2020-01-06 DIAGNOSIS — T8130XA Disruption of wound, unspecified, initial encounter: Secondary | ICD-10-CM

## 2020-01-06 DIAGNOSIS — T84498A Other mechanical complication of other internal orthopedic devices, implants and grafts, initial encounter: Secondary | ICD-10-CM

## 2020-01-06 DIAGNOSIS — L03031 Cellulitis of right toe: Secondary | ICD-10-CM

## 2020-01-06 DIAGNOSIS — L02611 Cutaneous abscess of right foot: Secondary | ICD-10-CM

## 2020-01-06 NOTE — Progress Notes (Signed)
Subjective: Sonya Allen is a 64 y.o. female patient seen today in office for wound check.  Patient is status post removal of exposed implant performed in office on 12/30/2019.  Patient reports that it seems like its getting better,has been using packing to area daily.  Denies any increased episode of pain redness warmth swelling or drainage.  Reports that she has been taking antibiotics without any issues.  Patient denies calf pain, denies headache, chest pain, shortness of breath, nausea, vomiting, fever, or chills.  Patient Active Problem List   Diagnosis Date Noted  . Recurrent right knee instability 12/31/2017  . Acute pain of left shoulder 08/13/2017  . 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 Knee pain, right 01/23/2014  . 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  . Unilateral primary osteoarthritis, right knee 05/26/2013  . Other specified postprocedural states 01/31/2013    Current Outpatient Medications on File Prior to Visit  Medication Sig Dispense Refill  . betamethasone acetate-betamethasone sodium phosphate (CELESTONE) 6 (3-3) MG/ML injection Inject 12 mg into the articular space.    . bupivacaine, PF, (MARCAINE) 0.25 % SOLN injection Inject 25 mg into the skin.    . carisoprodol (SOMA) 350 MG tablet Take 350 mg by mouth 3 (three) times daily.    . carisoprodol (SOMA) 350 MG tablet     . clindamycin (CLEOCIN) 150 MG capsule Take 1 capsule (150 mg total) by mouth 2 (two) times daily. 14 capsule 0  . clonazePAM (KLONOPIN) 0.5 MG tablet Take 0.5 mg by mouth at bedtime. Restless legs    . clonazePAM (KLONOPIN) 0.5 MG tablet     . clonazePAM (KLONOPIN) 1 MG tablet     . conjugated estrogens (PREMARIN) vaginal cream     . diclofenac (CATAFLAM) 50 MG tablet     . diclofenac (VOLTAREN) 50 MG EC tablet     . diclofenac sodium (VOLTAREN) 1 % GEL     . docusate sodium (COLACE) 100 MG capsule Take 1 capsule (100 mg total) by mouth 2 (two) times daily. 10 capsule 0  . enoxaparin  (LOVENOX) 40 MG/0.4ML injection Inject 0.4 mLs (40 mg total) into the skin daily. 12 Syringe 0  . escitalopram (LEXAPRO) 10 MG tablet     . furosemide (LASIX) 20 MG tablet Take 20 mg by mouth daily.    . furosemide (LASIX) 20 MG tablet     . GRALISE 600 MG TABS     . HYDROmorphone HCl (EXALGO) 12 MG T24A SR tablet     . Hylan 48 MG/6ML SOSY Inject 48 mg into the articular space.    Marland Kitchen ibuprofen (ADVIL) 800 MG tablet Take 1 tablet (800 mg total) by mouth every 8 (eight) hours as needed. 30 tablet 0  . lamoTRIgine (LAMICTAL) 100 MG tablet     . Lifitegrast (XIIDRA) 5 % SOLN     . LINZESS 290 MCG CAPS capsule     . methocarbamol (ROBAXIN) 500 MG tablet Take 1-2 tablets (500-1,000 mg total) by mouth every 6 (six) hours as needed for muscle spasms. 60 tablet 2  . morphine (MS CONTIN) 30 MG 12 hr tablet Take by mouth.    . morphine (MS CONTIN) 60 MG 12 hr tablet Take 60 mg by mouth every 12 (twelve) hours.    Marland Kitchen MOVANTIK 12.5 MG TABS     . NUVIGIL 150 MG tablet     . ondansetron (ZOFRAN) 4 MG tablet     . ondansetron (ZOFRAN) 4 MG tablet     .  oxycodone (ROXICODONE) 30 MG immediate release tablet Take 1 tablet (30 mg total) by mouth every 8 (eight) hours as needed (for breakthrough pain). 100 tablet 0  . oxyCODONE-acetaminophen (PERCOCET) 10-325 MG tablet     . Polyethyl Glycol-Propyl Glycol (SYSTANE OP) Apply 1 drop to eye 2 (two) times daily as needed (dry eyes).    . pramipexole (MIRAPEX) 0.125 MG tablet     . pravastatin (PRAVACHOL) 40 MG tablet Take 40 mg by mouth daily.    . pravastatin (PRAVACHOL) 40 MG tablet Take by mouth.    . PredniSONE 10 MG KIT Take as instructed for 12 days and a tapering dose 48 each 0  . pregabalin (LYRICA) 100 MG capsule Take by mouth daily.  0  . pregabalin (LYRICA) 150 MG capsule Take 150 mg by mouth daily.    . promethazine (PHENERGAN) 25 MG tablet Take 1 tablet (25 mg total) by mouth every 8 (eight) hours as needed for nausea or vomiting. 20 tablet 0  .  sulfamethoxazole-trimethoprim (BACTRIM) 400-80 MG tablet Take 1 tablet by mouth 2 (two) times daily. 28 tablet 1  . Suvorexant (BELSOMRA) 15 MG TABS     . tiZANidine (ZANAFLEX) 4 MG tablet     . topiramate (TOPAMAX) 100 MG tablet Take 100 mg by mouth 2 (two) times daily.    Marland Kitchen topiramate (TOPAMAX) 50 MG tablet     . venlafaxine XR (EFFEXOR-XR) 150 MG 24 hr capsule Take 150 mg by mouth daily.    Marland Kitchen venlafaxine XR (EFFEXOR-XR) 150 MG 24 hr capsule     . Vitamin D, Ergocalciferol, (DRISDOL) 50000 units CAPS capsule     . ZETIA 10 MG tablet      Current Facility-Administered Medications on File Prior to Visit  Medication Dose Route Frequency Provider Last Rate Last Admin  . triamcinolone acetonide (KENALOG) 10 MG/ML injection 10 mg  10 mg Other Once Landis Martins, DPM      . triamcinolone acetonide (KENALOG) 10 MG/ML injection 10 mg  10 mg Other Once [place], Sherral Dirocco, DPM      . triamcinolone acetonide (KENALOG) 10 MG/ML injection 10 mg  10 mg Other Once Landis Martins, DPM        Allergies  Allergen Reactions  . Pravastatin Sodium Other (See Comments)    Muscles  aches Muscles  aches   . Augmentin [Amoxicillin-Pot Clavulanate] Diarrhea    Objective: There were no vitals filed for this visit.  General: No acute distress, AAOx3  Right foot: Wound base measures 1 x 0.5 x 0.5 cm at the first metatarsophalangeal joint with granular tissue and mild periwound maceration at the area of previous extruded/exposed implant removal site, no active drainage, no redness, no warmth, very minimal swelling, no malodor, no other acute signs of infection noted, capillary fill time <5 seconds in all digits, gross sensation present via light touch to right foot.  No pain with calf compression.   X-ray consistent with arthroplasty with space that is devoid now at the first metatarsophalangeal joint with small erosions at those surfaces where the implant was removed as well as small erosions in the bone from  previous plate fixation removal on the lateral view there is mild elevation of the hallux, previous hardware intact at the midfoot and ankle.  Mild soft tissue swelling.  No other acute findings.  Assessment and Plan:  Problem List Items Addressed This Visit    None    Visit Diagnoses    Exposed orthopaedic hardware ([place])    -  Primary   Wound dehiscence       S/P foot surgery, right       Cellulitis and abscess of toe of right foot       Right foot pain           -Patient seen and evaluated -X-rays reviewed -Right foot wound was cleansed with saline and then redressed utilizing iodoform packing and dry dressing with gauze splinting to the hallux; advised patient to continue with daily dressing changes consisting of the same -Continue with Bactrim, until completed -Continue with wedge offloading postoperative shoe -Advised patient to limit activity to necessity  -Continue with refrain from getting the area wet -Advised patient rest, elevation and Motrin as needed for pain -Return in office in 2 weeks however on next week we will plan to do a virtual wound check visit to see how the patient is doing since she will be planning to go out of town to [place] next weekend [employer], [place]

## 2020-01-11 ENCOUNTER — Telehealth: Payer: Self-pay | Admitting: Sports Medicine

## 2020-01-11 ENCOUNTER — Telehealth: Payer: Medicare Other | Admitting: Sports Medicine

## 2020-01-11 NOTE — Telephone Encounter (Signed)
Pt cancelled virtual appt for today.  States she couldn't get picture to come thru.  Also has appt for 01-20-2020 & she will wait to see you then.  Was able to get something to keep water off, so she didn't feel appt was needed.

## 2020-01-11 NOTE — Telephone Encounter (Signed)
Ok thanks 

## 2020-01-12 ENCOUNTER — Encounter: Payer: Self-pay | Admitting: Sports Medicine

## 2020-01-13 DIAGNOSIS — M79673 Pain in unspecified foot: Secondary | ICD-10-CM | POA: Diagnosis not present

## 2020-01-13 DIAGNOSIS — Z79899 Other long term (current) drug therapy: Secondary | ICD-10-CM | POA: Diagnosis not present

## 2020-01-13 DIAGNOSIS — M79606 Pain in leg, unspecified: Secondary | ICD-10-CM | POA: Diagnosis not present

## 2020-01-13 DIAGNOSIS — M961 Postlaminectomy syndrome, not elsewhere classified: Secondary | ICD-10-CM | POA: Diagnosis not present

## 2020-01-13 DIAGNOSIS — G894 Chronic pain syndrome: Secondary | ICD-10-CM | POA: Diagnosis not present

## 2020-01-13 DIAGNOSIS — Z79891 Long term (current) use of opiate analgesic: Secondary | ICD-10-CM | POA: Diagnosis not present

## 2020-01-20 ENCOUNTER — Encounter: Payer: Self-pay | Admitting: Sports Medicine

## 2020-01-20 ENCOUNTER — Ambulatory Visit: Payer: Medicare Other | Admitting: Sports Medicine

## 2020-01-20 ENCOUNTER — Other Ambulatory Visit: Payer: Self-pay

## 2020-01-20 DIAGNOSIS — Z9889 Other specified postprocedural states: Secondary | ICD-10-CM

## 2020-01-20 DIAGNOSIS — T84498A Other mechanical complication of other internal orthopedic devices, implants and grafts, initial encounter: Secondary | ICD-10-CM

## 2020-01-20 DIAGNOSIS — M79671 Pain in right foot: Secondary | ICD-10-CM

## 2020-01-20 DIAGNOSIS — T8130XA Disruption of wound, unspecified, initial encounter: Secondary | ICD-10-CM

## 2020-01-20 NOTE — Progress Notes (Signed)
Subjective: Sonya Allen is a 64 y.o. female patient seen today in office for wound check.  Patient is status post removal of exposed implant performed in office on 12/30/2019.  Patient reports that it is healing and stop packing it on Tuesday because it seemed like it was closing open too small to get any packing in the area.  Reports a little swelling at the first toe joint.  Patient denies calf pain, denies headache, chest pain, shortness of breath, nausea, vomiting, fever, or chills.  Patient Active Problem List   Diagnosis Date Noted  . Recurrent right knee instability 12/31/2017  . Acute pain of left shoulder 08/13/2017  . 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 Knee pain, right 01/23/2014  . 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  . Unilateral primary osteoarthritis, right knee 05/26/2013  . Other specified postprocedural states 01/31/2013    Current Outpatient Medications on File Prior to Visit  Medication Sig Dispense Refill  . betamethasone acetate-betamethasone sodium phosphate (CELESTONE) 6 (3-3) MG/ML injection Inject 12 mg into the articular space.    . bupivacaine, PF, (MARCAINE) 0.25 % SOLN injection Inject 25 mg into the skin.    . carisoprodol (SOMA) 350 MG tablet Take 350 mg by mouth 3 (three) times daily.    . carisoprodol (SOMA) 350 MG tablet     . clindamycin (CLEOCIN) 150 MG capsule Take 1 capsule (150 mg total) by mouth 2 (two) times daily. 14 capsule 0  . clonazePAM (KLONOPIN) 0.5 MG tablet Take 0.5 mg by mouth at bedtime. Restless legs    . clonazePAM (KLONOPIN) 0.5 MG tablet     . clonazePAM (KLONOPIN) 1 MG tablet     . conjugated estrogens (PREMARIN) vaginal cream     . diclofenac (CATAFLAM) 50 MG tablet     . diclofenac (VOLTAREN) 50 MG EC tablet     . diclofenac sodium (VOLTAREN) 1 % GEL     . docusate sodium (COLACE) 100 MG capsule Take 1 capsule (100 mg total) by mouth 2 (two) times daily. 10 capsule 0  . enoxaparin (LOVENOX) 40 MG/0.4ML injection  Inject 0.4 mLs (40 mg total) into the skin daily. 12 Syringe 0  . escitalopram (LEXAPRO) 10 MG tablet     . furosemide (LASIX) 20 MG tablet Take 20 mg by mouth daily.    . furosemide (LASIX) 20 MG tablet     . GRALISE 600 MG TABS     . HYDROmorphone HCl (EXALGO) 12 MG T24A SR tablet     . Hylan 48 MG/6ML SOSY Inject 48 mg into the articular space.    Marland Kitchen ibuprofen (ADVIL) 800 MG tablet Take 1 tablet (800 mg total) by mouth every 8 (eight) hours as needed. 30 tablet 0  . lamoTRIgine (LAMICTAL) 100 MG tablet     . Lifitegrast (XIIDRA) 5 % SOLN     . LINZESS 290 MCG CAPS capsule     . methocarbamol (ROBAXIN) 500 MG tablet Take 1-2 tablets (500-1,000 mg total) by mouth every 6 (six) hours as needed for muscle spasms. 60 tablet 2  . morphine (MS CONTIN) 30 MG 12 hr tablet Take by mouth.    . morphine (MS CONTIN) 60 MG 12 hr tablet Take 60 mg by mouth every 12 (twelve) hours.    Marland Kitchen MOVANTIK 12.5 MG TABS     . NUVIGIL 150 MG tablet     . ondansetron (ZOFRAN) 4 MG tablet     . ondansetron (ZOFRAN) 4 MG tablet     .  oxycodone (ROXICODONE) 30 MG immediate release tablet Take 1 tablet (30 mg total) by mouth every 8 (eight) hours as needed (for breakthrough pain). 100 tablet 0  . oxyCODONE-acetaminophen (PERCOCET) 10-325 MG tablet     . Polyethyl Glycol-Propyl Glycol (SYSTANE OP) Apply 1 drop to eye 2 (two) times daily as needed (dry eyes).    . pramipexole (MIRAPEX) 0.125 MG tablet     . pravastatin (PRAVACHOL) 40 MG tablet Take 40 mg by mouth daily.    . pravastatin (PRAVACHOL) 40 MG tablet Take by mouth.    . PredniSONE 10 MG KIT Take as instructed for 12 days and a tapering dose 48 each 0  . pregabalin (LYRICA) 100 MG capsule Take by mouth daily.  0  . pregabalin (LYRICA) 150 MG capsule Take 150 mg by mouth daily.    . promethazine (PHENERGAN) 25 MG tablet Take 1 tablet (25 mg total) by mouth every 8 (eight) hours as needed for nausea or vomiting. 20 tablet 0  . sulfamethoxazole-trimethoprim  (BACTRIM) 400-80 MG tablet Take 1 tablet by mouth 2 (two) times daily. 28 tablet 1  . Suvorexant (BELSOMRA) 15 MG TABS     . tiZANidine (ZANAFLEX) 4 MG tablet     . topiramate (TOPAMAX) 100 MG tablet Take 100 mg by mouth 2 (two) times daily.    Marland Kitchen topiramate (TOPAMAX) 50 MG tablet     . venlafaxine XR (EFFEXOR-XR) 150 MG 24 hr capsule Take 150 mg by mouth daily.    Marland Kitchen venlafaxine XR (EFFEXOR-XR) 150 MG 24 hr capsule     . Vitamin D, Ergocalciferol, (DRISDOL) 50000 units CAPS capsule     . ZETIA 10 MG tablet      Current Facility-Administered Medications on File Prior to Visit  Medication Dose Route Frequency Provider Last Rate Last Admin  . triamcinolone acetonide (KENALOG) 10 MG/ML injection 10 mg  10 mg Other Once Landis Martins, DPM      . triamcinolone acetonide (KENALOG) 10 MG/ML injection 10 mg  10 mg Other Once [place], Lithzy Bernard, DPM      . triamcinolone acetonide (KENALOG) 10 MG/ML injection 10 mg  10 mg Other Once Landis Martins, DPM        Allergies  Allergen Reactions  . Pravastatin Sodium Other (See Comments)    Muscles  aches Muscles  aches   . Augmentin [Amoxicillin-Pot Clavulanate] Diarrhea    Objective: There were no vitals filed for this visit.  General: No acute distress, AAOx3  Right foot: Wound base measures 0.2 x 0.2 x 0.2 cm at the first metatarsophalangeal joint with granular tissue and minimal periwound maceration at the area of previous extruded/exposed implant removal site at the first metatarsophalangeal joint, no active drainage, no redness, no warmth, very minimal swelling, no malodor, no other acute signs of infection noted, capillary fill time <5 seconds in all digits, gross sensation present via light touch to right foot.  No pain with calf compression.   Upon weightbearing hallux appears to be sitting in acceptable position however it is noted that her ankle is still in severe valgus as well as her knees  Assessment and Plan:  Problem List Items  Addressed This Visit    None    Visit Diagnoses    Wound dehiscence    -  Primary   Exposed orthopaedic hardware ([place])       S/P foot surgery, right       Right foot pain           -  Patient seen and evaluated -Right foot wound was cleansed with saline and then redressed with dry gauze and Band-Aid dressing and advised patient to continue with dressings of the same daily -May use open slide in shoe as long as there is no rubbing or irritation to the right first toe -Advised patient to limit activity to necessity  -Continue with refrain from getting the area wet until it has completely healed -Advised patient rest, elevation and Motrin as needed for pain -Return in office in 2 to 3 weeks for wound check.  Advised patient at next visit if doing well may resume her using toe splint to help stretch the first toe. Landis Martins, DPM

## 2020-02-03 ENCOUNTER — Ambulatory Visit: Payer: Medicare Other | Admitting: Sports Medicine

## 2020-02-09 ENCOUNTER — Encounter: Payer: Self-pay | Admitting: Sports Medicine

## 2020-02-09 ENCOUNTER — Ambulatory Visit (INDEPENDENT_AMBULATORY_CARE_PROVIDER_SITE_OTHER): Payer: Medicare Other | Admitting: Sports Medicine

## 2020-02-09 ENCOUNTER — Other Ambulatory Visit: Payer: Self-pay

## 2020-02-09 DIAGNOSIS — T8130XA Disruption of wound, unspecified, initial encounter: Secondary | ICD-10-CM

## 2020-02-09 DIAGNOSIS — Z9889 Other specified postprocedural states: Secondary | ICD-10-CM | POA: Diagnosis not present

## 2020-02-09 DIAGNOSIS — M79671 Pain in right foot: Secondary | ICD-10-CM | POA: Diagnosis not present

## 2020-02-09 DIAGNOSIS — T84498A Other mechanical complication of other internal orthopedic devices, implants and grafts, initial encounter: Secondary | ICD-10-CM

## 2020-02-09 NOTE — Progress Notes (Signed)
Subjective: Sonya Allen is a 64 y.o. female patient seen today in office for wound check.  Patient is status post removal of exposed implant performed in office on 12/30/2019.  Patient reports area is doing good in a normal shoe but still has some pain sub met 1, 4/10 but thinks that the top has healed.  Patient denies calf pain, denies headache, chest pain, shortness of breath, nausea, vomiting, fever, or chills.  Patient Active Problem List   Diagnosis Date Noted  . Recurrent right knee instability 12/31/2017  . Acute pain of left shoulder 08/13/2017  . 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 Knee pain, right 01/23/2014  . 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  . Unilateral primary osteoarthritis, right knee 05/26/2013  . Other specified postprocedural states 01/31/2013    Current Outpatient Medications on File Prior to Visit  Medication Sig Dispense Refill  . betamethasone acetate-betamethasone sodium phosphate (CELESTONE) 6 (3-3) MG/ML injection Inject 12 mg into the articular space.    . bupivacaine, PF, (MARCAINE) 0.25 % SOLN injection Inject 25 mg into the skin.    . carisoprodol (SOMA) 350 MG tablet Take 350 mg by mouth 3 (three) times daily.    . carisoprodol (SOMA) 350 MG tablet     . clindamycin (CLEOCIN) 150 MG capsule Take 1 capsule (150 mg total) by mouth 2 (two) times daily. 14 capsule 0  . clonazePAM (KLONOPIN) 0.5 MG tablet Take 0.5 mg by mouth at bedtime. Restless legs    . clonazePAM (KLONOPIN) 0.5 MG tablet     . clonazePAM (KLONOPIN) 1 MG tablet     . conjugated estrogens (PREMARIN) vaginal cream     . diclofenac (CATAFLAM) 50 MG tablet     . diclofenac (VOLTAREN) 50 MG EC tablet     . diclofenac sodium (VOLTAREN) 1 % GEL     . docusate sodium (COLACE) 100 MG capsule Take 1 capsule (100 mg total) by mouth 2 (two) times daily. 10 capsule 0  . enoxaparin (LOVENOX) 40 MG/0.4ML injection Inject 0.4 mLs (40 mg total) into the skin daily. 12 Syringe 0  .  escitalopram (LEXAPRO) 10 MG tablet     . furosemide (LASIX) 20 MG tablet Take 20 mg by mouth daily.    . furosemide (LASIX) 20 MG tablet     . GRALISE 600 MG TABS     . HYDROmorphone HCl (EXALGO) 12 MG T24A SR tablet     . Hylan 48 MG/6ML SOSY Inject 48 mg into the articular space.    Marland Kitchen ibuprofen (ADVIL) 800 MG tablet Take 1 tablet (800 mg total) by mouth every 8 (eight) hours as needed. 30 tablet 0  . lamoTRIgine (LAMICTAL) 100 MG tablet     . Lifitegrast (XIIDRA) 5 % SOLN     . LINZESS 290 MCG CAPS capsule     . methocarbamol (ROBAXIN) 500 MG tablet Take 1-2 tablets (500-1,000 mg total) by mouth every 6 (six) hours as needed for muscle spasms. 60 tablet 2  . morphine (MS CONTIN) 30 MG 12 hr tablet Take by mouth.    . morphine (MS CONTIN) 60 MG 12 hr tablet Take 60 mg by mouth every 12 (twelve) hours.    Marland Kitchen MOVANTIK 12.5 MG TABS     . NUVIGIL 150 MG tablet     . ondansetron (ZOFRAN) 4 MG tablet     . ondansetron (ZOFRAN) 4 MG tablet     . oxycodone (ROXICODONE) 30 MG immediate release tablet Take  1 tablet (30 mg total) by mouth every 8 (eight) hours as needed (for breakthrough pain). 100 tablet 0  . oxyCODONE-acetaminophen (PERCOCET) 10-325 MG tablet     . Polyethyl Glycol-Propyl Glycol (SYSTANE OP) Apply 1 drop to eye 2 (two) times daily as needed (dry eyes).    . pramipexole (MIRAPEX) 0.125 MG tablet     . pravastatin (PRAVACHOL) 40 MG tablet Take 40 mg by mouth daily.    . pravastatin (PRAVACHOL) 40 MG tablet Take by mouth.    . PredniSONE 10 MG KIT Take as instructed for 12 days and a tapering dose 48 each 0  . pregabalin (LYRICA) 100 MG capsule Take by mouth daily.  0  . pregabalin (LYRICA) 150 MG capsule Take 150 mg by mouth daily.    . promethazine (PHENERGAN) 25 MG tablet Take 1 tablet (25 mg total) by mouth every 8 (eight) hours as needed for nausea or vomiting. 20 tablet 0  . sulfamethoxazole-trimethoprim (BACTRIM) 400-80 MG tablet Take 1 tablet by mouth 2 (two) times daily. 28  tablet 1  . Suvorexant (BELSOMRA) 15 MG TABS     . tiZANidine (ZANAFLEX) 4 MG tablet     . topiramate (TOPAMAX) 100 MG tablet Take 100 mg by mouth 2 (two) times daily.    Marland Kitchen topiramate (TOPAMAX) 50 MG tablet     . venlafaxine XR (EFFEXOR-XR) 150 MG 24 hr capsule Take 150 mg by mouth daily.    Marland Kitchen venlafaxine XR (EFFEXOR-XR) 150 MG 24 hr capsule     . Vitamin D, Ergocalciferol, (DRISDOL) 50000 units CAPS capsule     . ZETIA 10 MG tablet      Current Facility-Administered Medications on File Prior to Visit  Medication Dose Route Frequency Provider Last Rate Last Admin  . triamcinolone acetonide (KENALOG) 10 MG/ML injection 10 mg  10 mg Other Once Landis Martins, DPM      . triamcinolone acetonide (KENALOG) 10 MG/ML injection 10 mg  10 mg Other Once [place], Rashee Marschall, DPM      . triamcinolone acetonide (KENALOG) 10 MG/ML injection 10 mg  10 mg Other Once Landis Martins, DPM        Allergies  Allergen Reactions  . Pravastatin Sodium Other (See Comments)    Muscles  aches Muscles  aches   . Augmentin [Amoxicillin-Pot Clavulanate] Diarrhea    Objective: There were no vitals filed for this visit.  General: No acute distress, AAOx3  Right foot: Healed over at previous ulceration, no active drainage, no redness, no warmth, very minimal swelling, no malodor, no other acute signs of infection noted, capillary fill time <5 seconds in all digits, gross sensation present via light touch to right foot.  No pain with calf compression.   Upon weightbearing hallux appears to be sitting in slightly elevated position however it is noted that her ankle is still in severe valgus as well as her knees which is probably contributing to increased stress submet 1 on right  Assessment and Plan:  Problem List Items Addressed This Visit    None    Visit Diagnoses    Wound dehiscence    -  Primary   Exposed orthopaedic hardware ([place])       S/P foot surgery, right       Right foot pain         -Patient  seen and evaluated -Right foot wound remains healed -Advised patient that this is probably best position that we will be able to get her first  toe due to her fused ankle -Advised patient to consider custom functional foot orthotics or bracing to further help offload submet 1.  Patient to see Liliane Channel or Benjie Karvonen for this. -May use open slide in shoe as long as there is no rubbing or irritation to the right first toe -Advised patient to limit activity to tolerance -Continue with rest elevation and Motrin as needed for pain -May continue with toe splinting as tolerated -Return to office for orthotics or brace with Rick or Silver Huguenin, DPM

## 2020-02-10 DIAGNOSIS — G894 Chronic pain syndrome: Secondary | ICD-10-CM | POA: Diagnosis not present

## 2020-02-10 DIAGNOSIS — M545 Low back pain: Secondary | ICD-10-CM | POA: Diagnosis not present

## 2020-02-10 DIAGNOSIS — M961 Postlaminectomy syndrome, not elsewhere classified: Secondary | ICD-10-CM | POA: Diagnosis not present

## 2020-02-10 DIAGNOSIS — M79673 Pain in unspecified foot: Secondary | ICD-10-CM | POA: Diagnosis not present

## 2020-02-17 ENCOUNTER — Other Ambulatory Visit: Payer: Medicare Other

## 2020-02-24 DIAGNOSIS — G4700 Insomnia, unspecified: Secondary | ICD-10-CM | POA: Diagnosis not present

## 2020-02-24 DIAGNOSIS — Z79899 Other long term (current) drug therapy: Secondary | ICD-10-CM | POA: Diagnosis not present

## 2020-02-24 DIAGNOSIS — E785 Hyperlipidemia, unspecified: Secondary | ICD-10-CM | POA: Diagnosis not present

## 2020-03-13 DIAGNOSIS — G894 Chronic pain syndrome: Secondary | ICD-10-CM | POA: Diagnosis not present

## 2020-03-13 DIAGNOSIS — M1710 Unilateral primary osteoarthritis, unspecified knee: Secondary | ICD-10-CM | POA: Diagnosis not present

## 2020-03-13 DIAGNOSIS — Z4542 Encounter for adjustment and management of neuropacemaker (brain) (peripheral nerve) (spinal cord): Secondary | ICD-10-CM | POA: Diagnosis not present

## 2020-03-13 DIAGNOSIS — M79673 Pain in unspecified foot: Secondary | ICD-10-CM | POA: Diagnosis not present

## 2020-03-13 DIAGNOSIS — M961 Postlaminectomy syndrome, not elsewhere classified: Secondary | ICD-10-CM | POA: Diagnosis not present

## 2020-03-14 ENCOUNTER — Other Ambulatory Visit: Payer: Self-pay | Admitting: Sports Medicine

## 2020-03-14 DIAGNOSIS — T8130XA Disruption of wound, unspecified, initial encounter: Secondary | ICD-10-CM

## 2020-03-21 ENCOUNTER — Ambulatory Visit: Payer: Medicare Other | Admitting: Sports Medicine

## 2020-03-21 ENCOUNTER — Other Ambulatory Visit: Payer: Self-pay | Admitting: Sports Medicine

## 2020-03-21 ENCOUNTER — Other Ambulatory Visit: Payer: Self-pay

## 2020-03-21 ENCOUNTER — Encounter: Payer: Self-pay | Admitting: Sports Medicine

## 2020-03-21 DIAGNOSIS — M205X1 Other deformities of toe(s) (acquired), right foot: Secondary | ICD-10-CM

## 2020-03-21 DIAGNOSIS — L603 Nail dystrophy: Secondary | ICD-10-CM

## 2020-03-21 DIAGNOSIS — M79671 Pain in right foot: Secondary | ICD-10-CM | POA: Diagnosis not present

## 2020-03-21 DIAGNOSIS — L570 Actinic keratosis: Secondary | ICD-10-CM | POA: Diagnosis not present

## 2020-03-21 DIAGNOSIS — L905 Scar conditions and fibrosis of skin: Secondary | ICD-10-CM

## 2020-03-21 NOTE — Progress Notes (Signed)
Subjective: Sonya Allen is a 64 y.o. female patient seen today in office for wound check and for a new issue reports that she has some stiffness of the right third toe and it is loose over the last 2 months with hard skin underneath the nail reports that at one time it was dark but appears to be lightening up some has tried over-the-counter antifungal treatment without any improvement.  Patient is also status post removal of exposed implant performed in office on 12/30/2019.  Reports that the area has healed but the toe is slightly elevated and is wishing that something could be done to help her toe and the painful calluses to get sometimes on the bottom.  Patient denies constitutional symptoms at this time.  Patient Active Problem List   Diagnosis Date Noted  . Recurrent right knee instability 12/31/2017  . Acute pain of left shoulder 08/13/2017  . 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 Knee pain, right 01/23/2014  . 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  . Unilateral primary osteoarthritis, right knee 05/26/2013  . Other specified postprocedural states 01/31/2013    Current Outpatient Medications on File Prior to Visit  Medication Sig Dispense Refill  . betamethasone acetate-betamethasone sodium phosphate (CELESTONE) 6 (3-3) MG/ML injection Inject 12 mg into the articular space.    . bupivacaine, PF, (MARCAINE) 0.25 % SOLN injection Inject 25 mg into the skin.    . carisoprodol (SOMA) 350 MG tablet Take 350 mg by mouth 3 (three) times daily.    . carisoprodol (SOMA) 350 MG tablet     . clindamycin (CLEOCIN) 150 MG capsule Take 1 capsule (150 mg total) by mouth 2 (two) times daily. 14 capsule 0  . clonazePAM (KLONOPIN) 0.5 MG tablet Take 0.5 mg by mouth at bedtime. Restless legs    . clonazePAM (KLONOPIN) 0.5 MG tablet     . clonazePAM (KLONOPIN) 1 MG tablet     . conjugated estrogens (PREMARIN) vaginal cream     . diclofenac (CATAFLAM) 50 MG tablet     . diclofenac (VOLTAREN) 50  MG EC tablet     . diclofenac sodium (VOLTAREN) 1 % GEL     . docusate sodium (COLACE) 100 MG capsule Take 1 capsule (100 mg total) by mouth 2 (two) times daily. 10 capsule 0  . enoxaparin (LOVENOX) 40 MG/0.4ML injection Inject 0.4 mLs (40 mg total) into the skin daily. 12 Syringe 0  . escitalopram (LEXAPRO) 10 MG tablet     . furosemide (LASIX) 20 MG tablet Take 20 mg by mouth daily.    . furosemide (LASIX) 20 MG tablet     . GRALISE 600 MG TABS     . HYDROmorphone HCl (EXALGO) 12 MG T24A SR tablet     . Hylan 48 MG/6ML SOSY Inject 48 mg into the articular space.    Marland Kitchen ibuprofen (ADVIL) 800 MG tablet Take 1 tablet (800 mg total) by mouth every 8 (eight) hours as needed. 30 tablet 0  . lamoTRIgine (LAMICTAL) 100 MG tablet     . Lifitegrast (XIIDRA) 5 % SOLN     . LINZESS 290 MCG CAPS capsule     . methocarbamol (ROBAXIN) 500 MG tablet Take 1-2 tablets (500-1,000 mg total) by mouth every 6 (six) hours as needed for muscle spasms. 60 tablet 2  . morphine (MS CONTIN) 30 MG 12 hr tablet Take by mouth.    . morphine (MS CONTIN) 60 MG 12 hr tablet Take 60 mg by mouth  every 12 (twelve) hours.    Marland Kitchen MOVANTIK 12.5 MG TABS     . NUVIGIL 150 MG tablet     . ondansetron (ZOFRAN) 4 MG tablet     . ondansetron (ZOFRAN) 4 MG tablet     . oxycodone (ROXICODONE) 30 MG immediate release tablet Take 1 tablet (30 mg total) by mouth every 8 (eight) hours as needed (for breakthrough pain). 100 tablet 0  . oxyCODONE-acetaminophen (PERCOCET) 10-325 MG tablet     . Polyethyl Glycol-Propyl Glycol (SYSTANE OP) Apply 1 drop to eye 2 (two) times daily as needed (dry eyes).    . pramipexole (MIRAPEX) 0.125 MG tablet     . pravastatin (PRAVACHOL) 40 MG tablet Take 40 mg by mouth daily.    . pravastatin (PRAVACHOL) 40 MG tablet Take by mouth.    . PredniSONE 10 MG KIT Take as instructed for 12 days and a tapering dose 48 each 0  . pregabalin (LYRICA) 100 MG capsule Take by mouth daily.  0  . pregabalin (LYRICA) 150 MG  capsule Take 150 mg by mouth daily.    . promethazine (PHENERGAN) 25 MG tablet Take 1 tablet (25 mg total) by mouth every 8 (eight) hours as needed for nausea or vomiting. 20 tablet 0  . sulfamethoxazole-trimethoprim (BACTRIM) 400-80 MG tablet Take 1 tablet by mouth 2 (two) times daily. 28 tablet 1  . Suvorexant (BELSOMRA) 15 MG TABS     . tiZANidine (ZANAFLEX) 4 MG tablet     . topiramate (TOPAMAX) 100 MG tablet Take 100 mg by mouth 2 (two) times daily.    Marland Kitchen topiramate (TOPAMAX) 50 MG tablet     . venlafaxine XR (EFFEXOR-XR) 150 MG 24 hr capsule Take 150 mg by mouth daily.    Marland Kitchen venlafaxine XR (EFFEXOR-XR) 150 MG 24 hr capsule     . Vitamin D, Ergocalciferol, (DRISDOL) 50000 units CAPS capsule     . ZETIA 10 MG tablet      Current Facility-Administered Medications on File Prior to Visit  Medication Dose Route Frequency Provider Last Rate Last Admin  . triamcinolone acetonide (KENALOG) 10 MG/ML injection 10 mg  10 mg Other Once Landis Martins, DPM      . triamcinolone acetonide (KENALOG) 10 MG/ML injection 10 mg  10 mg Other Once [place], Jahayra Mazo, DPM      . triamcinolone acetonide (KENALOG) 10 MG/ML injection 10 mg  10 mg Other Once Landis Martins, DPM        Allergies  Allergen Reactions  . Pravastatin Sodium Other (See Comments)    Muscles  aches Muscles  aches   . Augmentin [Amoxicillin-Pot Clavulanate] Diarrhea    Objective: There were no vitals filed for this visit.  General: No acute distress, AAOx3  Right foot: Distal thickening of right third toenail with subungual debris possible onychomycosis, healed surgical site at first metatarsophalangeal joint no redness warmth swelling or drainage, there is scar contracture noted, mild reactive keratosis submet 1 and 2 on right, gross sensation present via light touch to right foot.  No pain with calf compression.   Unchanged elevated position of first toe on weightbearing with severe valgus deformity of ankle and  knee.  Assessment and Plan:  Problem List Items Addressed This Visit    None    Visit Diagnoses    Nail dystrophy    -  Primary   Scar tissue       Keratosis       Right foot pain  Hallux extensus, acquired, right         -Patient seen and evaluated -Right foot wound remains healed at 1st MTPJ -Advised patient that this is probably best position that we will be able to get her first toe due to her fused ankle, may consider in office needling as a last result to try to release scar at a later time but likely will not change outcome due to ankle and leg issue -Advised patient to consider custom functional foot orthotics or bracing to further help offload submet 1.  Patient to see Liliane Channel or Benjie Karvonen for this as scheduled  -At no additional charge mechanically debrided callus submet 1 and 2 on right and right third toenail -Advised patient to consider oral Lamisil and continuing her topical antifungal; patient is to have PCP to send recent blood work results if normal we will proceed with sending Lamisil to pharmacy for patient to take for nail fungus -Return to office for orthotics or brace as scheduled with Liliane Channel or Silver Huguenin, DPM

## 2020-03-23 ENCOUNTER — Other Ambulatory Visit: Payer: Self-pay | Admitting: Sports Medicine

## 2020-03-23 MED ORDER — TERBINAFINE HCL 250 MG PO TABS: 250.0000 mg | ORAL_TABLET | Freq: Every day | ORAL | 0 refills | Status: DC

## 2020-03-23 NOTE — Progress Notes (Signed)
Sent Lamisil for nail fungus -Dr. Kathie Rhodes

## 2020-04-10 ENCOUNTER — Other Ambulatory Visit: Payer: Medicare Other | Admitting: Orthotics

## 2020-04-10 ENCOUNTER — Other Ambulatory Visit: Payer: Self-pay

## 2020-04-10 DIAGNOSIS — M205X1 Other deformities of toe(s) (acquired), right foot: Secondary | ICD-10-CM | POA: Diagnosis not present

## 2020-04-10 DIAGNOSIS — M205X2 Other deformities of toe(s) (acquired), left foot: Secondary | ICD-10-CM | POA: Diagnosis not present

## 2020-04-13 DIAGNOSIS — Z79899 Other long term (current) drug therapy: Secondary | ICD-10-CM | POA: Diagnosis not present

## 2020-04-13 DIAGNOSIS — M961 Postlaminectomy syndrome, not elsewhere classified: Secondary | ICD-10-CM | POA: Diagnosis not present

## 2020-04-13 DIAGNOSIS — Z79891 Long term (current) use of opiate analgesic: Secondary | ICD-10-CM | POA: Diagnosis not present

## 2020-04-13 DIAGNOSIS — M1710 Unilateral primary osteoarthritis, unspecified knee: Secondary | ICD-10-CM | POA: Diagnosis not present

## 2020-04-13 DIAGNOSIS — G894 Chronic pain syndrome: Secondary | ICD-10-CM | POA: Diagnosis not present

## 2020-04-13 DIAGNOSIS — M79673 Pain in unspecified foot: Secondary | ICD-10-CM | POA: Diagnosis not present

## 2020-04-19 DIAGNOSIS — R21 Rash and other nonspecific skin eruption: Secondary | ICD-10-CM | POA: Diagnosis not present

## 2020-04-24 DIAGNOSIS — N390 Urinary tract infection, site not specified: Secondary | ICD-10-CM | POA: Diagnosis not present

## 2020-05-07 DIAGNOSIS — Z0000 Encounter for general adult medical examination without abnormal findings: Secondary | ICD-10-CM | POA: Diagnosis not present

## 2020-05-07 DIAGNOSIS — Z7189 Other specified counseling: Secondary | ICD-10-CM | POA: Diagnosis not present

## 2020-05-14 DIAGNOSIS — G894 Chronic pain syndrome: Secondary | ICD-10-CM | POA: Diagnosis not present

## 2020-05-14 DIAGNOSIS — M961 Postlaminectomy syndrome, not elsewhere classified: Secondary | ICD-10-CM | POA: Diagnosis not present

## 2020-05-14 DIAGNOSIS — M5442 Lumbago with sciatica, left side: Secondary | ICD-10-CM | POA: Diagnosis not present

## 2020-05-14 DIAGNOSIS — Z981 Arthrodesis status: Secondary | ICD-10-CM | POA: Diagnosis not present

## 2020-05-17 ENCOUNTER — Other Ambulatory Visit: Payer: Medicare Other

## 2020-05-29 DIAGNOSIS — R0781 Pleurodynia: Secondary | ICD-10-CM | POA: Diagnosis not present

## 2020-05-29 DIAGNOSIS — M549 Dorsalgia, unspecified: Secondary | ICD-10-CM | POA: Diagnosis not present

## 2020-05-30 DIAGNOSIS — M8589 Other specified disorders of bone density and structure, multiple sites: Secondary | ICD-10-CM | POA: Diagnosis not present

## 2020-05-30 DIAGNOSIS — R0781 Pleurodynia: Secondary | ICD-10-CM | POA: Diagnosis not present

## 2020-05-30 DIAGNOSIS — M546 Pain in thoracic spine: Secondary | ICD-10-CM | POA: Diagnosis not present

## 2020-05-30 DIAGNOSIS — M545 Low back pain, unspecified: Secondary | ICD-10-CM | POA: Diagnosis not present

## 2020-05-30 DIAGNOSIS — M549 Dorsalgia, unspecified: Secondary | ICD-10-CM | POA: Diagnosis not present

## 2020-05-30 DIAGNOSIS — R071 Chest pain on breathing: Secondary | ICD-10-CM | POA: Diagnosis not present

## 2020-06-13 DIAGNOSIS — M961 Postlaminectomy syndrome, not elsewhere classified: Secondary | ICD-10-CM | POA: Diagnosis not present

## 2020-06-13 DIAGNOSIS — Z9689 Presence of other specified functional implants: Secondary | ICD-10-CM | POA: Diagnosis not present

## 2020-06-13 DIAGNOSIS — M5442 Lumbago with sciatica, left side: Secondary | ICD-10-CM | POA: Diagnosis not present

## 2020-06-13 DIAGNOSIS — G894 Chronic pain syndrome: Secondary | ICD-10-CM | POA: Diagnosis not present

## 2020-06-14 DIAGNOSIS — Z79899 Other long term (current) drug therapy: Secondary | ICD-10-CM | POA: Diagnosis not present

## 2020-06-21 DIAGNOSIS — M1812 Unilateral primary osteoarthritis of first carpometacarpal joint, left hand: Secondary | ICD-10-CM | POA: Insufficient documentation

## 2020-06-21 DIAGNOSIS — G5602 Carpal tunnel syndrome, left upper limb: Secondary | ICD-10-CM

## 2020-06-21 HISTORY — DX: Carpal tunnel syndrome, left upper limb: G56.02

## 2020-06-21 HISTORY — DX: Unilateral primary osteoarthritis of first carpometacarpal joint, left hand: M18.12

## 2020-06-26 DIAGNOSIS — M899 Disorder of bone, unspecified: Secondary | ICD-10-CM | POA: Diagnosis not present

## 2020-07-04 ENCOUNTER — Ambulatory Visit: Payer: Medicare Other | Admitting: Orthotics

## 2020-07-04 ENCOUNTER — Ambulatory Visit: Payer: Medicare Other | Admitting: Sports Medicine

## 2020-07-04 ENCOUNTER — Encounter: Payer: Self-pay | Admitting: Sports Medicine

## 2020-07-04 ENCOUNTER — Other Ambulatory Visit: Payer: Self-pay

## 2020-07-04 DIAGNOSIS — L905 Scar conditions and fibrosis of skin: Secondary | ICD-10-CM | POA: Diagnosis not present

## 2020-07-04 DIAGNOSIS — L570 Actinic keratosis: Secondary | ICD-10-CM | POA: Diagnosis not present

## 2020-07-04 DIAGNOSIS — Z9889 Other specified postprocedural states: Secondary | ICD-10-CM | POA: Diagnosis not present

## 2020-07-04 DIAGNOSIS — M205X1 Other deformities of toe(s) (acquired), right foot: Secondary | ICD-10-CM

## 2020-07-04 DIAGNOSIS — M79671 Pain in right foot: Secondary | ICD-10-CM | POA: Diagnosis not present

## 2020-07-04 NOTE — Progress Notes (Signed)
Subjective: Sonya Allen is a 64 y.o. female patient seen today in office for  painful calluses and for orthotics dispensing, reports that she trimmed her callus and made her foot bleed a few days ago. Reports that she also has rubbing and bone that sticks out at side of foot.  Patient denies constitutional symptoms at this time.  Patient Active Problem List   Diagnosis Date Noted  . Arthritis of carpometacarpal [facility]) joint of left thumb 06/21/2020  . Left carpal tunnel syndrome 06/21/2020  . Recurrent right knee instability 12/31/2017  . Acute pain of left shoulder 08/13/2017  . 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 Knee pain, right 01/23/2014  . 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  . Unilateral primary osteoarthritis, right knee 05/26/2013  . Other specified postprocedural states 01/31/2013    Current Outpatient Medications on File Prior to Visit  Medication Sig Dispense Refill  . betamethasone acetate-betamethasone sodium phosphate (CELESTONE) 6 (3-3) MG/ML injection Inject 12 mg into the articular space.    . bupivacaine, PF, (MARCAINE) 0.25 % SOLN injection Inject 25 mg into the skin.    . carisoprodol (SOMA) 350 MG tablet Take 350 mg by mouth 3 (three) times daily.    . carisoprodol (SOMA) 350 MG tablet     . clindamycin (CLEOCIN) 150 MG capsule Take 1 capsule (150 mg total) by mouth 2 (two) times daily. 14 capsule 0  . clonazePAM (KLONOPIN) 0.5 MG tablet Take 0.5 mg by mouth at bedtime. Restless legs    . clonazePAM (KLONOPIN) 0.5 MG tablet     . clonazePAM (KLONOPIN) 1 MG tablet     . conjugated estrogens (PREMARIN) vaginal cream     . diclofenac (CATAFLAM) 50 MG tablet     . diclofenac (VOLTAREN) 50 MG EC tablet     . diclofenac sodium (VOLTAREN) 1 % GEL     . docusate sodium (COLACE) 100 MG capsule Take 1 capsule (100 mg total) by mouth 2 (two) times daily. 10 capsule 0  . enoxaparin (LOVENOX) 40 MG/0.4ML injection Inject 0.4 mLs (40 mg total) into the skin  daily. 12 Syringe 0  . escitalopram (LEXAPRO) 10 MG tablet     . furosemide (LASIX) 20 MG tablet Take 20 mg by mouth daily.    . furosemide (LASIX) 20 MG tablet     . GRALISE 600 MG TABS     . HYDROmorphone HCl (EXALGO) 12 MG T24A SR tablet     . Hylan 48 MG/6ML SOSY Inject 48 mg into the articular space.    Marland Kitchen ibuprofen (ADVIL) 800 MG tablet Take 1 tablet (800 mg total) by mouth every 8 (eight) hours as needed. 30 tablet 0  . lamoTRIgine (LAMICTAL) 100 MG tablet     . Lifitegrast (XIIDRA) 5 % SOLN     . LINZESS 290 MCG CAPS capsule     . methocarbamol (ROBAXIN) 500 MG tablet Take 1-2 tablets (500-1,000 mg total) by mouth every 6 (six) hours as needed for muscle spasms. 60 tablet 2  . morphine (MS CONTIN) 30 MG 12 hr tablet Take by mouth.    . morphine (MS CONTIN) 60 MG 12 hr tablet Take 60 mg by mouth every 12 (twelve) hours.    Marland Kitchen MOVANTIK 12.5 MG TABS     . NUVIGIL 150 MG tablet     . ondansetron (ZOFRAN) 4 MG tablet     . ondansetron (ZOFRAN) 4 MG tablet     . oxycodone (ROXICODONE) 30 MG immediate  release tablet Take 1 tablet (30 mg total) by mouth every 8 (eight) hours as needed (for breakthrough pain). 100 tablet 0  . oxyCODONE-acetaminophen (PERCOCET) 10-325 MG tablet     . Polyethyl Glycol-Propyl Glycol (SYSTANE OP) Apply 1 drop to eye 2 (two) times daily as needed (dry eyes).    . pramipexole (MIRAPEX) 0.125 MG tablet     . pravastatin (PRAVACHOL) 40 MG tablet Take 40 mg by mouth daily.    . pravastatin (PRAVACHOL) 40 MG tablet Take by mouth.    . PredniSONE 10 MG KIT Take as instructed for 12 days and a tapering dose 48 each 0  . pregabalin (LYRICA) 100 MG capsule Take by mouth daily.  0  . pregabalin (LYRICA) 150 MG capsule Take 150 mg by mouth daily.    . promethazine (PHENERGAN) 25 MG tablet Take 1 tablet (25 mg total) by mouth every 8 (eight) hours as needed for nausea or vomiting. 20 tablet 0  . sulfamethoxazole-trimethoprim (BACTRIM) 400-80 MG tablet Take 1 tablet by mouth  2 (two) times daily. 28 tablet 1  . Suvorexant (BELSOMRA) 15 MG TABS     . terbinafine (LAMISIL) 250 MG tablet Take 1 tablet (250 mg total) by mouth daily. 90 tablet 0  . tiZANidine (ZANAFLEX) 4 MG tablet     . topiramate (TOPAMAX) 100 MG tablet Take 100 mg by mouth 2 (two) times daily.    Marland Kitchen topiramate (TOPAMAX) 50 MG tablet     . venlafaxine XR (EFFEXOR-XR) 150 MG 24 hr capsule Take 150 mg by mouth daily.    Marland Kitchen venlafaxine XR (EFFEXOR-XR) 150 MG 24 hr capsule     . Vitamin D, Ergocalciferol, (DRISDOL) 50000 units CAPS capsule     . ZETIA 10 MG tablet      Current Facility-Administered Medications on File Prior to Visit  Medication Dose Route Frequency Provider Last Rate Last Admin  . triamcinolone acetonide (KENALOG) 10 MG/ML injection 10 mg  10 mg Other Once Landis Martins, DPM      . triamcinolone acetonide (KENALOG) 10 MG/ML injection 10 mg  10 mg Other Once [place], Shayra Anton, DPM      . triamcinolone acetonide (KENALOG) 10 MG/ML injection 10 mg  10 mg Other Once Landis Martins, DPM        Allergies  Allergen Reactions  . Pravastatin Sodium Other (See Comments)    Muscles  aches Muscles  aches   . Augmentin [Amoxicillin-Pot Clavulanate] Diarrhea    Objective: There were no vitals filed for this visit.  General: No acute distress, AAOx3  Right foot: + healed surgical site at first metatarsophalangeal joint no redness warmth swelling or drainage, there is scar contracture noted, mild reactive keratosis submet 1 and 2 on right, prominent 5th met head right foot, gross sensation present via light touch to right foot.  No pain with calf compression.   Unchanged elevated position of first toe on weightbearing with severe valgus deformity of ankle and knee.  Assessment and Plan:  Problem List Items Addressed This Visit    None    Visit Diagnoses    Keratosis    -  Primary   Scar tissue       Right foot pain       Hallux extensus, acquired, right       S/P foot surgery, right           -Patient seen and evaluated -Right foot wound remains healed at 1st MTPJ -Advised patient that this is  probably best position that we will be able to get her first toe due to her fused ankle, may consider in office needling next visit and made her aware that needling has limits and may not work -Patient met with Liliane Channel and orthotics were dispensed -At no additional charge mechanically debrided callus submet 1 and 2 on right without incident using 15 blade -Return to office for orthotics check and for possible needling procedure as scheduled next visit  Landis Martins, DPM

## 2020-07-04 NOTE — Progress Notes (Signed)
Patient came in today to pick up custom made foot orthotics.  The goals were accomplished and the patient reported no dissatisfaction with said orthotics.  Patient was advised of breakin period and how to report any issues. 

## 2020-07-16 DIAGNOSIS — Z79891 Long term (current) use of opiate analgesic: Secondary | ICD-10-CM | POA: Diagnosis not present

## 2020-07-16 DIAGNOSIS — M5442 Lumbago with sciatica, left side: Secondary | ICD-10-CM | POA: Diagnosis not present

## 2020-07-16 DIAGNOSIS — G894 Chronic pain syndrome: Secondary | ICD-10-CM | POA: Diagnosis not present

## 2020-07-16 DIAGNOSIS — Z79899 Other long term (current) drug therapy: Secondary | ICD-10-CM | POA: Diagnosis not present

## 2020-07-16 DIAGNOSIS — Z9689 Presence of other specified functional implants: Secondary | ICD-10-CM | POA: Diagnosis not present

## 2020-07-16 DIAGNOSIS — M961 Postlaminectomy syndrome, not elsewhere classified: Secondary | ICD-10-CM | POA: Diagnosis not present

## 2020-08-08 ENCOUNTER — Other Ambulatory Visit: Payer: Self-pay

## 2020-08-08 ENCOUNTER — Ambulatory Visit: Payer: Medicare Other | Admitting: Sports Medicine

## 2020-08-08 ENCOUNTER — Encounter: Payer: Self-pay | Admitting: Sports Medicine

## 2020-08-08 DIAGNOSIS — E785 Hyperlipidemia, unspecified: Secondary | ICD-10-CM | POA: Diagnosis not present

## 2020-08-08 DIAGNOSIS — L570 Actinic keratosis: Secondary | ICD-10-CM

## 2020-08-08 DIAGNOSIS — Z79899 Other long term (current) drug therapy: Secondary | ICD-10-CM | POA: Diagnosis not present

## 2020-08-08 DIAGNOSIS — L905 Scar conditions and fibrosis of skin: Secondary | ICD-10-CM | POA: Diagnosis not present

## 2020-08-08 DIAGNOSIS — M6240 Contracture of muscle, unspecified site: Secondary | ICD-10-CM

## 2020-08-08 DIAGNOSIS — M205X1 Other deformities of toe(s) (acquired), right foot: Secondary | ICD-10-CM

## 2020-08-08 DIAGNOSIS — M79671 Pain in right foot: Secondary | ICD-10-CM

## 2020-08-08 MED ORDER — DOXYCYCLINE HYCLATE 100 MG PO TABS
100.0000 mg | ORAL_TABLET | Freq: Two times a day (BID) | ORAL | 0 refills | Status: DC
Start: 2020-08-08 — End: 2021-02-20

## 2020-08-08 NOTE — Progress Notes (Signed)
DATE: 08/08/2020  SURGEON: Asencion Islam, DPM  PREOPERATIVE DIAGNOSIS:  Right first toe contracture at the first MPJ of scar tissue and contracted tendon sheath  POSTOPERATIVE DIAGNOSIS: Same  PROCEDURE PERFORMED: Right in office tenotomy and release of scar tissue using a 18-gauge needle  HEMOSTASIS:  Manual pressure  ESTIMATED BLOOD LOSS:  Minimal  ANESTHESIA:  Local anesthetic 1:1 mixture 1% lidocaine plain and 0.5% Marcaine plain in a digital block fashion utilizing 3 cc  COMPLICATIONS:  None.   INDICATIONS FOR PROCEDURE:  This patient is a pleasant 64 y.o. female patient seen on several occasions complaining of pain and callus at the first toe on the right.  Patient has had multiple procedures performed to the right first MPJ and has been left with residual scar tissue and elevation of the toe when standing.  Patient reports that current custom orthotics seem to help a little bit but is still concerned with the toe potentially rubbing inside of a shoe due to elevation.  Patient desires for an office tendon procedure and understands alternatives and risks.  PREPARATION FOR PROCEDURE:   A local block was administered and  The Right foot was then scrubbed, prepped, using Betadine.    PROCEDURE IN DETAIL:  At this time, attention was directed to the patient's Right foot where there was of note contracted scar dorsally at the first metatarsophalangeal joint.  Utilizing a 18-gauge needle a small stab incision was made over the area of contracted soft tissue and scar at extensor tendon after a few stab incisions were made using an 18-gauge needle manual pressure was used to manipulate the toe to assist with breaking up scar tissue and then the area was dressed with Steri-Strips and dry dressing to help with splinting the toe.  Applied dry sterile dressing.  The patient tolerated the procedure and local anesthesia well without complication.  Neurovascular status intact to all digits of the  foot.  Prescribe doxycycline for preventative measures due to in office nature of procedure and advised patient to change dressing daily applying antibiotic cream and Coban and gauze dressing versus Band-Aid.  Patient advised to rest ice elevate if needed for pain control.  Patient to follow-up as scheduled for postoperative care.  Asencion Islam, DPM

## 2020-08-10 DIAGNOSIS — Z9689 Presence of other specified functional implants: Secondary | ICD-10-CM | POA: Diagnosis not present

## 2020-08-10 DIAGNOSIS — M5442 Lumbago with sciatica, left side: Secondary | ICD-10-CM | POA: Diagnosis not present

## 2020-08-10 DIAGNOSIS — G894 Chronic pain syndrome: Secondary | ICD-10-CM | POA: Diagnosis not present

## 2020-08-10 DIAGNOSIS — M961 Postlaminectomy syndrome, not elsewhere classified: Secondary | ICD-10-CM | POA: Diagnosis not present

## 2020-08-29 ENCOUNTER — Ambulatory Visit: Payer: Medicare Other | Admitting: Sports Medicine

## 2020-09-12 ENCOUNTER — Encounter: Payer: Self-pay | Admitting: Sports Medicine

## 2020-09-12 ENCOUNTER — Other Ambulatory Visit: Payer: Self-pay

## 2020-09-12 ENCOUNTER — Ambulatory Visit: Payer: Medicare Other | Admitting: Sports Medicine

## 2020-09-12 DIAGNOSIS — Z9689 Presence of other specified functional implants: Secondary | ICD-10-CM | POA: Diagnosis not present

## 2020-09-12 DIAGNOSIS — M79671 Pain in right foot: Secondary | ICD-10-CM

## 2020-09-12 DIAGNOSIS — L570 Actinic keratosis: Secondary | ICD-10-CM

## 2020-09-12 DIAGNOSIS — M6240 Contracture of muscle, unspecified site: Secondary | ICD-10-CM

## 2020-09-12 DIAGNOSIS — M5442 Lumbago with sciatica, left side: Secondary | ICD-10-CM | POA: Diagnosis not present

## 2020-09-12 DIAGNOSIS — G894 Chronic pain syndrome: Secondary | ICD-10-CM | POA: Diagnosis not present

## 2020-09-12 DIAGNOSIS — M205X1 Other deformities of toe(s) (acquired), right foot: Secondary | ICD-10-CM

## 2020-09-12 DIAGNOSIS — L905 Scar conditions and fibrosis of skin: Secondary | ICD-10-CM

## 2020-09-12 DIAGNOSIS — M961 Postlaminectomy syndrome, not elsewhere classified: Secondary | ICD-10-CM | POA: Diagnosis not present

## 2020-09-12 NOTE — Progress Notes (Signed)
Subjective: Sonya Allen is a 65 y.o. female patient seen today in office for follow-up evaluation status post in office needling procedure for scar tissue at the right great toe joint performed 08/08/2020.  Patient reports that the procedure may have helped some but the toe still sticks up and reports that she is hurting underneath the bottom of the toe joint with some hard skin/callus.  Patient denies constitutional symptoms at this time.  Patient Active Problem List   Diagnosis Date Noted  . Arthritis of carpometacarpal [facility]) joint of left thumb 06/21/2020  . Left carpal tunnel syndrome 06/21/2020  . Recurrent right knee instability 12/31/2017  . Acute pain of left shoulder 08/13/2017  . 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 Knee pain, right 01/23/2014  . 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  . Unilateral primary osteoarthritis, right knee 05/26/2013  . Other specified postprocedural states 01/31/2013    Current Outpatient Medications on File Prior to Visit  Medication Sig Dispense Refill  . betamethasone acetate-betamethasone sodium phosphate (CELESTONE) 6 (3-3) MG/ML injection Inject 12 mg into the articular space.    . bupivacaine, PF, (MARCAINE) 0.25 % SOLN injection Inject 25 mg into the skin.    . carisoprodol (SOMA) 350 MG tablet Take 350 mg by mouth 3 (three) times daily.    . carisoprodol (SOMA) 350 MG tablet     . clindamycin (CLEOCIN) 150 MG capsule Take 1 capsule (150 mg total) by mouth 2 (two) times daily. 14 capsule 0  . clonazePAM (KLONOPIN) 0.5 MG tablet Take 0.5 mg by mouth at bedtime. Restless legs    . clonazePAM (KLONOPIN) 0.5 MG tablet     . clonazePAM (KLONOPIN) 1 MG tablet     . conjugated estrogens (PREMARIN) vaginal cream     . diclofenac (CATAFLAM) 50 MG tablet     . diclofenac (VOLTAREN) 50 MG EC tablet     . diclofenac sodium (VOLTAREN) 1 % GEL     . docusate sodium (COLACE) 100 MG capsule Take 1 capsule (100 mg total) by mouth 2 (two) times  daily. 10 capsule 0  . doxycycline (VIBRA-TABS) 100 MG tablet Take 1 tablet (100 mg total) by mouth 2 (two) times daily. 20 tablet 0  . enoxaparin (LOVENOX) 40 MG/0.4ML injection Inject 0.4 mLs (40 mg total) into the skin daily. 12 Syringe 0  . escitalopram (LEXAPRO) 10 MG tablet     . furosemide (LASIX) 20 MG tablet Take 20 mg by mouth daily.    . furosemide (LASIX) 20 MG tablet     . GRALISE 600 MG TABS     . HYDROmorphone HCl (EXALGO) 12 MG T24A SR tablet     . Hylan 48 MG/6ML SOSY Inject 48 mg into the articular space.    Marland Kitchen ibuprofen (ADVIL) 800 MG tablet Take 1 tablet (800 mg total) by mouth every 8 (eight) hours as needed. 30 tablet 0  . lamoTRIgine (LAMICTAL) 100 MG tablet     . Lifitegrast (XIIDRA) 5 % SOLN     . LINZESS 290 MCG CAPS capsule     . methocarbamol (ROBAXIN) 500 MG tablet Take 1-2 tablets (500-1,000 mg total) by mouth every 6 (six) hours as needed for muscle spasms. 60 tablet 2  . morphine (MS CONTIN) 30 MG 12 hr tablet Take by mouth.    . morphine (MS CONTIN) 60 MG 12 hr tablet Take 60 mg by mouth every 12 (twelve) hours.    Marland Kitchen MOVANTIK 12.5 MG TABS     .  NUVIGIL 150 MG tablet     . ondansetron (ZOFRAN) 4 MG tablet     . ondansetron (ZOFRAN) 4 MG tablet     . oxycodone (ROXICODONE) 30 MG immediate release tablet Take 1 tablet (30 mg total) by mouth every 8 (eight) hours as needed (for breakthrough pain). 100 tablet 0  . oxyCODONE-acetaminophen (PERCOCET) 10-325 MG tablet     . Polyethyl Glycol-Propyl Glycol (SYSTANE OP) Apply 1 drop to eye 2 (two) times daily as needed (dry eyes).    . pramipexole (MIRAPEX) 0.125 MG tablet     . pravastatin (PRAVACHOL) 40 MG tablet Take 40 mg by mouth daily.    . pravastatin (PRAVACHOL) 40 MG tablet Take by mouth.    . PredniSONE 10 MG KIT Take as instructed for 12 days and a tapering dose 48 each 0  . pregabalin (LYRICA) 100 MG capsule Take by mouth daily.  0  . pregabalin (LYRICA) 150 MG capsule Take 150 mg by mouth daily.    .  promethazine (PHENERGAN) 25 MG tablet Take 1 tablet (25 mg total) by mouth every 8 (eight) hours as needed for nausea or vomiting. 20 tablet 0  . sulfamethoxazole-trimethoprim (BACTRIM) 400-80 MG tablet Take 1 tablet by mouth 2 (two) times daily. 28 tablet 1  . Suvorexant (BELSOMRA) 15 MG TABS     . terbinafine (LAMISIL) 250 MG tablet Take 1 tablet (250 mg total) by mouth daily. 90 tablet 0  . tiZANidine (ZANAFLEX) 4 MG tablet     . topiramate (TOPAMAX) 100 MG tablet Take 100 mg by mouth 2 (two) times daily.    Marland Kitchen topiramate (TOPAMAX) 50 MG tablet     . venlafaxine XR (EFFEXOR-XR) 150 MG 24 hr capsule Take 150 mg by mouth daily.    Marland Kitchen venlafaxine XR (EFFEXOR-XR) 150 MG 24 hr capsule     . Vitamin D, Ergocalciferol, (DRISDOL) 50000 units CAPS capsule     . ZETIA 10 MG tablet      Current Facility-Administered Medications on File Prior to Visit  Medication Dose Route Frequency Provider Last Rate Last Admin  . triamcinolone acetonide (KENALOG) 10 MG/ML injection 10 mg  10 mg Other Once Landis Martins, DPM      . triamcinolone acetonide (KENALOG) 10 MG/ML injection 10 mg  10 mg Other Once [place], Carisha Kantor, DPM      . triamcinolone acetonide (KENALOG) 10 MG/ML injection 10 mg  10 mg Other Once Landis Martins, DPM        Allergies  Allergen Reactions  . Pravastatin Sodium Other (See Comments)    Muscles  aches Muscles  aches   . Augmentin [Amoxicillin-Pot Clavulanate] Diarrhea    Objective: There were no vitals filed for this visit.  General: No acute distress, AAOx3  Right foot: + healed surgical site at first metatarsophalangeal joint no redness warmth swelling or drainage, there is still residual scar contracture noted, mild reactive keratosis submet 1 and 2 on right, prominent 5th met head right foot, there is pain to the keratosis of met 1 with nucleated core, gross sensation present via light touch to right foot.  No pain with calf compression.  Decreased elevated position of the first  toe with severe valgus deformity of ankle and knee history of ankle fusion.  Assessment and Plan:  Problem List Items Addressed This Visit   None   Visit Diagnoses    Scar tissue    -  Primary   Hallux extensus, acquired, right  Right foot pain       Contracture of tendon sheath       Keratosis          -Patient seen and evaluated -Procedure site well-healed at right great toe joint -Discussed with patient in great detail that needling procedure may offer very minimal improvement however if she would like for [place] to try to break up more of the scar tissue we can try again for another needling procedure in 3 months with some additional dexamethasone injected around the joint to try to help with the scar tissue -Meanwhile mechanically debrided using a 15 blade keratosis submit 1 on right to patient's tolerance and applied Salidocaine and Band-Aid -Added additional offloading padding step met 1 to patient's orthotics and advised patient to see Liliane Channel for additional adjustments or modification to try to offload the first and second MPJs better since still having a lot of pain underneath these metatarsal heads -Return to office for orthotics readjustment and possible reneedling procedure in a few months for scar tissue at the right great toe.  Landis Martins, DPM

## 2020-09-21 DIAGNOSIS — K120 Recurrent oral aphthae: Secondary | ICD-10-CM | POA: Diagnosis not present

## 2020-10-01 DIAGNOSIS — M8580 Other specified disorders of bone density and structure, unspecified site: Secondary | ICD-10-CM | POA: Diagnosis not present

## 2020-10-01 DIAGNOSIS — G4700 Insomnia, unspecified: Secondary | ICD-10-CM | POA: Diagnosis not present

## 2020-10-01 DIAGNOSIS — I959 Hypotension, unspecified: Secondary | ICD-10-CM | POA: Diagnosis not present

## 2020-10-01 DIAGNOSIS — E785 Hyperlipidemia, unspecified: Secondary | ICD-10-CM | POA: Diagnosis not present

## 2020-10-03 ENCOUNTER — Other Ambulatory Visit: Payer: Medicare Other | Admitting: Orthotics

## 2020-10-12 DIAGNOSIS — M961 Postlaminectomy syndrome, not elsewhere classified: Secondary | ICD-10-CM | POA: Diagnosis not present

## 2020-10-12 DIAGNOSIS — G894 Chronic pain syndrome: Secondary | ICD-10-CM | POA: Diagnosis not present

## 2020-10-12 DIAGNOSIS — Z9689 Presence of other specified functional implants: Secondary | ICD-10-CM | POA: Diagnosis not present

## 2020-10-12 DIAGNOSIS — M5442 Lumbago with sciatica, left side: Secondary | ICD-10-CM | POA: Diagnosis not present

## 2020-10-12 DIAGNOSIS — Z79899 Other long term (current) drug therapy: Secondary | ICD-10-CM | POA: Diagnosis not present

## 2020-10-12 DIAGNOSIS — Z79891 Long term (current) use of opiate analgesic: Secondary | ICD-10-CM | POA: Diagnosis not present

## 2020-10-18 ENCOUNTER — Other Ambulatory Visit: Payer: Medicare Other | Admitting: Orthotics

## 2020-11-05 DIAGNOSIS — M5442 Lumbago with sciatica, left side: Secondary | ICD-10-CM | POA: Diagnosis not present

## 2020-11-05 DIAGNOSIS — M961 Postlaminectomy syndrome, not elsewhere classified: Secondary | ICD-10-CM | POA: Diagnosis not present

## 2020-11-05 DIAGNOSIS — G894 Chronic pain syndrome: Secondary | ICD-10-CM | POA: Diagnosis not present

## 2020-11-05 DIAGNOSIS — Z9689 Presence of other specified functional implants: Secondary | ICD-10-CM | POA: Diagnosis not present

## 2020-11-08 DIAGNOSIS — Z1231 Encounter for screening mammogram for malignant neoplasm of breast: Secondary | ICD-10-CM | POA: Diagnosis not present

## 2020-11-14 ENCOUNTER — Encounter: Payer: Self-pay | Admitting: Sports Medicine

## 2020-11-14 ENCOUNTER — Ambulatory Visit: Payer: Medicare Other | Admitting: Sports Medicine

## 2020-11-14 ENCOUNTER — Other Ambulatory Visit: Payer: Self-pay

## 2020-11-14 DIAGNOSIS — M205X1 Other deformities of toe(s) (acquired), right foot: Secondary | ICD-10-CM

## 2020-11-14 DIAGNOSIS — M79671 Pain in right foot: Secondary | ICD-10-CM

## 2020-11-14 DIAGNOSIS — L570 Actinic keratosis: Secondary | ICD-10-CM

## 2020-11-14 DIAGNOSIS — M6240 Contracture of muscle, unspecified site: Secondary | ICD-10-CM

## 2020-11-14 DIAGNOSIS — L905 Scar conditions and fibrosis of skin: Secondary | ICD-10-CM

## 2020-11-14 MED ORDER — DEXAMETHASONE SODIUM PHOSPHATE 120 MG/30ML IJ SOLN
4.0000 mg | Freq: Once | INTRAMUSCULAR | Status: AC
  Administered 2020-11-14: 4 mg via INTRA_ARTICULAR

## 2020-11-14 NOTE — Progress Notes (Signed)
Subjective: Sonya Allen is a 65 y.o. female patient seen today in office for follow-up evaluation of scar tissue to right great toe.  Patient reports that the toe still sticks up and reports that she still gets a little bit of callus at the bottom of her foot has recently tried to trim it and cut herself.  Denies any other symptoms at this time. Patient Active Problem List   Diagnosis Date Noted  . Arthritis of carpometacarpal [facility]) joint of left thumb 06/21/2020  . Left carpal tunnel syndrome 06/21/2020  . Recurrent right knee instability 12/31/2017  . Acute pain of left shoulder 08/13/2017  . 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 Knee pain, right 01/23/2014  . 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  . Unilateral primary osteoarthritis, right knee 05/26/2013  . Other specified postprocedural states 01/31/2013    Current Outpatient Medications on File Prior to Visit  Medication Sig Dispense Refill  . betamethasone acetate-betamethasone sodium phosphate (CELESTONE) 6 (3-3) MG/ML injection Inject 12 mg into the articular space.    . bupivacaine, PF, (MARCAINE) 0.25 % SOLN injection Inject 25 mg into the skin.    . carisoprodol (SOMA) 350 MG tablet Take 350 mg by mouth 3 (three) times daily.    . carisoprodol (SOMA) 350 MG tablet     . clindamycin (CLEOCIN) 150 MG capsule Take 1 capsule (150 mg total) by mouth 2 (two) times daily. 14 capsule 0  . clonazePAM (KLONOPIN) 0.5 MG tablet Take 0.5 mg by mouth at bedtime. Restless legs    . clonazePAM (KLONOPIN) 0.5 MG tablet     . clonazePAM (KLONOPIN) 1 MG tablet     . conjugated estrogens (PREMARIN) vaginal cream     . diclofenac (CATAFLAM) 50 MG tablet     . diclofenac (VOLTAREN) 50 MG EC tablet     . diclofenac sodium (VOLTAREN) 1 % GEL     . docusate sodium (COLACE) 100 MG capsule Take 1 capsule (100 mg total) by mouth 2 (two) times daily. 10 capsule 0  . doxycycline (VIBRA-TABS) 100 MG tablet Take 1 tablet (100 mg total) by mouth  2 (two) times daily. 20 tablet 0  . enoxaparin (LOVENOX) 40 MG/0.4ML injection Inject 0.4 mLs (40 mg total) into the skin daily. 12 Syringe 0  . escitalopram (LEXAPRO) 10 MG tablet     . furosemide (LASIX) 20 MG tablet Take 20 mg by mouth daily.    . furosemide (LASIX) 20 MG tablet     . GRALISE 600 MG TABS     . HYDROmorphone HCl (EXALGO) 12 MG T24A SR tablet     . Hylan 48 MG/6ML SOSY Inject 48 mg into the articular space.    Marland Kitchen ibuprofen (ADVIL) 800 MG tablet Take 1 tablet (800 mg total) by mouth every 8 (eight) hours as needed. 30 tablet 0  . lamoTRIgine (LAMICTAL) 100 MG tablet     . Lifitegrast (XIIDRA) 5 % SOLN     . LINZESS 290 MCG CAPS capsule     . methocarbamol (ROBAXIN) 500 MG tablet Take 1-2 tablets (500-1,000 mg total) by mouth every 6 (six) hours as needed for muscle spasms. 60 tablet 2  . morphine (MS CONTIN) 30 MG 12 hr tablet Take by mouth.    . morphine (MS CONTIN) 60 MG 12 hr tablet Take 60 mg by mouth every 12 (twelve) hours.    Marland Kitchen MOVANTIK 12.5 MG TABS     . NUVIGIL 150 MG tablet     .  ondansetron (ZOFRAN) 4 MG tablet     . ondansetron (ZOFRAN) 4 MG tablet     . oxycodone (ROXICODONE) 30 MG immediate release tablet Take 1 tablet (30 mg total) by mouth every 8 (eight) hours as needed (for breakthrough pain). 100 tablet 0  . oxyCODONE-acetaminophen (PERCOCET) 10-325 MG tablet     . Polyethyl Glycol-Propyl Glycol (SYSTANE OP) Apply 1 drop to eye 2 (two) times daily as needed (dry eyes).    . pramipexole (MIRAPEX) 0.125 MG tablet     . pravastatin (PRAVACHOL) 40 MG tablet Take 40 mg by mouth daily.    . pravastatin (PRAVACHOL) 40 MG tablet Take by mouth.    . PredniSONE 10 MG KIT Take as instructed for 12 days and a tapering dose 48 each 0  . pregabalin (LYRICA) 100 MG capsule Take by mouth daily.  0  . pregabalin (LYRICA) 150 MG capsule Take 150 mg by mouth daily.    . promethazine (PHENERGAN) 25 MG tablet Take 1 tablet (25 mg total) by mouth every 8 (eight) hours as  needed for nausea or vomiting. 20 tablet 0  . sulfamethoxazole-trimethoprim (BACTRIM) 400-80 MG tablet Take 1 tablet by mouth 2 (two) times daily. 28 tablet 1  . Suvorexant (BELSOMRA) 15 MG TABS     . terbinafine (LAMISIL) 250 MG tablet Take 1 tablet (250 mg total) by mouth daily. 90 tablet 0  . tiZANidine (ZANAFLEX) 4 MG tablet     . topiramate (TOPAMAX) 100 MG tablet Take 100 mg by mouth 2 (two) times daily.    Marland Kitchen topiramate (TOPAMAX) 50 MG tablet     . venlafaxine XR (EFFEXOR-XR) 150 MG 24 hr capsule Take 150 mg by mouth daily.    Marland Kitchen venlafaxine XR (EFFEXOR-XR) 150 MG 24 hr capsule     . Vitamin D, Ergocalciferol, (DRISDOL) 50000 units CAPS capsule     . ZETIA 10 MG tablet      Current Facility-Administered Medications on File Prior to Visit  Medication Dose Route Frequency Provider Last Rate Last Admin  . triamcinolone acetonide (KENALOG) 10 MG/ML injection 10 mg  10 mg Other Once Landis Martins, DPM      . triamcinolone acetonide (KENALOG) 10 MG/ML injection 10 mg  10 mg Other Once [place], Illana Nolting, DPM      . triamcinolone acetonide (KENALOG) 10 MG/ML injection 10 mg  10 mg Other Once Landis Martins, DPM        Allergies  Allergen Reactions  . Pravastatin Sodium Other (See Comments)    Muscles  aches Muscles  aches   . Augmentin [Amoxicillin-Pot Clavulanate] Diarrhea    Objective: There were no vitals filed for this visit.  General: No acute distress, AAOx3  Right foot: + healed surgical site at first metatarsophalangeal joint no redness warmth swelling or drainage, there is still residual scar contracture noted at the dorsal first MPJ on the right greater than the second MPJ, mild reactive keratosis submet 1 and 2 on right, prominent 5th met head right foot, there is pain to the keratosis of met 1 with nucleated core, gross sensation present via light touch to right foot and a small abrasion to the right plantar forefoot submet to and from patient trimming her callus and cutting  herself.  No pain with calf compression.  Decreased elevated position of the first toe with severe valgus deformity of ankle and knee history of ankle fusion.  Assessment and Plan:  Problem List Items Addressed This Visit   None  Visit Diagnoses    Scar tissue    -  Primary   Relevant Medications   dexamethasone (DECADRON) injection 4 mg (Completed) (Start on 11/14/2020  5:00 PM)   Hallux extensus, acquired, right       Right foot pain       Contracture of tendon sheath       Keratosis          -Patient seen and evaluated -Discussed with patient possible reattempt of breaking up scar with using dexamethasone to see if this will help with the position of her right great toe however advised patient due to the multiple procedures that she is already had here it may be very limited improvement however we will try again another needling procedure as below -[place] local block was administered and The Right foot was then scrubbed, prepped, using Betadine.   PROCEDURE IN DETAIL: At this time, attention was directed to the patient's Right foot where there was of note contracted scar dorsally at the first metatarsophalangeal joint.  Utilizing a 18-gauge needle a small stab incision was made over the area of contracted soft tissue and scar at extensor tendon after a few stab incisions were made using an 18-gauge needle manual pressure was used to manipulate the toe to assist with breaking up scar tissue and then the area was injected with dexamethasone 1 cc over the first MPJ as well as the second MPJ and dressed with a compressive Coban splint dressing.  Advised patient to remove dressing later tonight and to use a Darco toe alignment splint to help stretch out the contracted soft tissues at the right great toe and second toe. Advised patient to closely monitor and if there is any acute signs or symptoms of infection to call office for oral antibiotics however at this time none  prescribed.  Patient tolerated procedure and injection into scar tissue well without complication. -Patient to follow-up as scheduled for postoperative care.   Landis Martins, DPM

## 2020-11-19 DIAGNOSIS — M545 Low back pain, unspecified: Secondary | ICD-10-CM | POA: Diagnosis not present

## 2020-11-19 DIAGNOSIS — M5442 Lumbago with sciatica, left side: Secondary | ICD-10-CM | POA: Diagnosis not present

## 2020-11-29 DIAGNOSIS — M1812 Unilateral primary osteoarthritis of first carpometacarpal joint, left hand: Secondary | ICD-10-CM | POA: Diagnosis not present

## 2020-12-10 DIAGNOSIS — Z79891 Long term (current) use of opiate analgesic: Secondary | ICD-10-CM | POA: Diagnosis not present

## 2020-12-10 DIAGNOSIS — M5442 Lumbago with sciatica, left side: Secondary | ICD-10-CM | POA: Diagnosis not present

## 2020-12-10 DIAGNOSIS — M961 Postlaminectomy syndrome, not elsewhere classified: Secondary | ICD-10-CM | POA: Diagnosis not present

## 2020-12-10 DIAGNOSIS — G894 Chronic pain syndrome: Secondary | ICD-10-CM | POA: Diagnosis not present

## 2020-12-10 DIAGNOSIS — Z9689 Presence of other specified functional implants: Secondary | ICD-10-CM | POA: Diagnosis not present

## 2020-12-10 DIAGNOSIS — Z79899 Other long term (current) drug therapy: Secondary | ICD-10-CM | POA: Diagnosis not present

## 2020-12-21 ENCOUNTER — Other Ambulatory Visit: Payer: Medicare Other

## 2020-12-26 ENCOUNTER — Ambulatory Visit: Payer: Medicare Other | Admitting: Sports Medicine

## 2020-12-26 ENCOUNTER — Other Ambulatory Visit: Payer: Self-pay

## 2020-12-26 ENCOUNTER — Encounter: Payer: Self-pay | Admitting: Sports Medicine

## 2020-12-26 DIAGNOSIS — L905 Scar conditions and fibrosis of skin: Secondary | ICD-10-CM

## 2020-12-26 DIAGNOSIS — M205X1 Other deformities of toe(s) (acquired), right foot: Secondary | ICD-10-CM

## 2020-12-26 DIAGNOSIS — M6240 Contracture of muscle, unspecified site: Secondary | ICD-10-CM | POA: Diagnosis not present

## 2020-12-26 DIAGNOSIS — L570 Actinic keratosis: Secondary | ICD-10-CM

## 2020-12-26 DIAGNOSIS — M79671 Pain in right foot: Secondary | ICD-10-CM

## 2020-12-26 MED ORDER — DEXAMETHASONE SODIUM PHOSPHATE 120 MG/30ML IJ SOLN
4.0000 mg | Freq: Once | INTRAMUSCULAR | Status: AC
Start: 2020-12-26 — End: 2020-12-26
  Administered 2020-12-26: 4 mg via INTRA_ARTICULAR

## 2020-12-26 NOTE — Progress Notes (Signed)
Subjective: Sonya Allen is a 65 y.o. female patient seen today in office for follow-up evaluation of scar tissue to right great toe.  Patient reports that the toe still sticks up with some soreness today at her callus area.  Patient reports that she forgot to bring in her orthotics but padding that I put on her orthotic previously do not work because throughout her toe.  Denies any other symptoms at this time. Patient Active Problem List   Diagnosis Date Noted  . Arthritis of carpometacarpal [facility]) joint of left thumb 06/21/2020  . Left carpal tunnel syndrome 06/21/2020  . Recurrent right knee instability 12/31/2017  . Acute pain of left shoulder 08/13/2017  . 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 Knee pain, right 01/23/2014  . 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  . Unilateral primary osteoarthritis, right knee 05/26/2013  . Other specified postprocedural states 01/31/2013    Current Outpatient Medications on File Prior to Visit  Medication Sig Dispense Refill  . betamethasone acetate-betamethasone sodium phosphate (CELESTONE) 6 (3-3) MG/ML injection Inject 12 mg into the articular space.    . bupivacaine, PF, (MARCAINE) 0.25 % SOLN injection Inject 25 mg into the skin.    . carisoprodol (SOMA) 350 MG tablet Take 350 mg by mouth 3 (three) times daily.    . carisoprodol (SOMA) 350 MG tablet     . clindamycin (CLEOCIN) 150 MG capsule Take 1 capsule (150 mg total) by mouth 2 (two) times daily. 14 capsule 0  . clonazePAM (KLONOPIN) 0.5 MG tablet Take 0.5 mg by mouth at bedtime. Restless legs    . clonazePAM (KLONOPIN) 0.5 MG tablet     . clonazePAM (KLONOPIN) 1 MG tablet     . conjugated estrogens (PREMARIN) vaginal cream     . diclofenac (CATAFLAM) 50 MG tablet     . diclofenac (VOLTAREN) 50 MG EC tablet     . diclofenac sodium (VOLTAREN) 1 % GEL     . docusate sodium (COLACE) 100 MG capsule Take 1 capsule (100 mg total) by mouth 2 (two) times daily. 10 capsule 0  .  doxycycline (VIBRA-TABS) 100 MG tablet Take 1 tablet (100 mg total) by mouth 2 (two) times daily. 20 tablet 0  . enoxaparin (LOVENOX) 40 MG/0.4ML injection Inject 0.4 mLs (40 mg total) into the skin daily. 12 Syringe 0  . escitalopram (LEXAPRO) 10 MG tablet     . furosemide (LASIX) 20 MG tablet Take 20 mg by mouth daily.    . furosemide (LASIX) 20 MG tablet     . GRALISE 600 MG TABS     . HYDROmorphone HCl (EXALGO) 12 MG T24A SR tablet     . Hylan 48 MG/6ML SOSY Inject 48 mg into the articular space.    Marland Kitchen ibuprofen (ADVIL) 800 MG tablet Take 1 tablet (800 mg total) by mouth every 8 (eight) hours as needed. 30 tablet 0  . lamoTRIgine (LAMICTAL) 100 MG tablet     . Lifitegrast (XIIDRA) 5 % SOLN     . LINZESS 290 MCG CAPS capsule     . methocarbamol (ROBAXIN) 500 MG tablet Take 1-2 tablets (500-1,000 mg total) by mouth every 6 (six) hours as needed for muscle spasms. 60 tablet 2  . morphine (MS CONTIN) 30 MG 12 hr tablet Take by mouth.    . morphine (MS CONTIN) 60 MG 12 hr tablet Take 60 mg by mouth every 12 (twelve) hours.    Marland Kitchen MOVANTIK 12.5 MG TABS     .  NUVIGIL 150 MG tablet     . ondansetron (ZOFRAN) 4 MG tablet     . ondansetron (ZOFRAN) 4 MG tablet     . oxycodone (ROXICODONE) 30 MG immediate release tablet Take 1 tablet (30 mg total) by mouth every 8 (eight) hours as needed (for breakthrough pain). 100 tablet 0  . oxyCODONE-acetaminophen (PERCOCET) 10-325 MG tablet     . Polyethyl Glycol-Propyl Glycol (SYSTANE OP) Apply 1 drop to eye 2 (two) times daily as needed (dry eyes).    . pramipexole (MIRAPEX) 0.125 MG tablet     . pravastatin (PRAVACHOL) 40 MG tablet Take 40 mg by mouth daily.    . pravastatin (PRAVACHOL) 40 MG tablet Take by mouth.    . PredniSONE 10 MG KIT Take as instructed for 12 days and a tapering dose 48 each 0  . pregabalin (LYRICA) 100 MG capsule Take by mouth daily.  0  . pregabalin (LYRICA) 150 MG capsule Take 150 mg by mouth daily.    . promethazine (PHENERGAN) 25  MG tablet Take 1 tablet (25 mg total) by mouth every 8 (eight) hours as needed for nausea or vomiting. 20 tablet 0  . sulfamethoxazole-trimethoprim (BACTRIM) 400-80 MG tablet Take 1 tablet by mouth 2 (two) times daily. 28 tablet 1  . Suvorexant (BELSOMRA) 15 MG TABS     . terbinafine (LAMISIL) 250 MG tablet Take 1 tablet (250 mg total) by mouth daily. 90 tablet 0  . tiZANidine (ZANAFLEX) 4 MG tablet     . topiramate (TOPAMAX) 100 MG tablet Take 100 mg by mouth 2 (two) times daily.    Marland Kitchen topiramate (TOPAMAX) 50 MG tablet     . venlafaxine XR (EFFEXOR-XR) 150 MG 24 hr capsule Take 150 mg by mouth daily.    Marland Kitchen venlafaxine XR (EFFEXOR-XR) 150 MG 24 hr capsule     . Vitamin D, Ergocalciferol, (DRISDOL) 50000 units CAPS capsule     . ZETIA 10 MG tablet      Current Facility-Administered Medications on File Prior to Visit  Medication Dose Route Frequency Provider Last Rate Last Admin  . triamcinolone acetonide (KENALOG) 10 MG/ML injection 10 mg  10 mg Other Once Landis Martins, DPM      . triamcinolone acetonide (KENALOG) 10 MG/ML injection 10 mg  10 mg Other Once [place], Felice Deem, DPM      . triamcinolone acetonide (KENALOG) 10 MG/ML injection 10 mg  10 mg Other Once Landis Martins, DPM        Allergies  Allergen Reactions  . Pravastatin Sodium Other (See Comments)    Muscles  aches Muscles  aches   . Augmentin [Amoxicillin-Pot Clavulanate] Diarrhea    Objective: There were no vitals filed for this visit.  General: No acute distress, AAOx3  Right foot: + healed surgical site with significant atrophy and scar at first metatarsophalangeal joint no redness warmth swelling or drainage, there is still residual scar contracture noted at the dorsal first MPJ on the right greater than the second MPJ, mild reactive keratosis submet 1 and 2 on right, prominent 5th met head right foot, there is pain to the keratosis of met 1 with nucleated core, gross sensation present via light touch to right foot.   No pain with calf compression.  Continued elevated position of the first toe with severe valgus deformity of ankle and knee history of ankle fusion.  Assessment and Plan:  Problem List Items Addressed This Visit   None   Visit Diagnoses  Scar tissue    -  Primary   Hallux extensus, acquired, right       Contracture of tendon sheath       Keratosis       Right foot pain          -Patient seen and evaluated -Re-Discussed with patient possible reattempt of breaking up scar with using dexamethasone to see if this will help with the position of her right great toe however advised patient due to the multiple procedures that she is already had here it may be very limited improvement however we will try again another needling procedure as below.  This is needling procedure #2. -PREPARATION FOR PROCEDURE:A local block was administered and The Right foot was then scrubbed, prepped, using Betadine.   PROCEDURE IN DETAIL: At this time, attention was directed to the patient's Right foot where there was of note contracted scar dorsally at the first metatarsophalangeal joint.  Utilizing a 18-gauge needle a small stab incision was made over the area of contracted soft tissue and scar at extensor tendon after a few stab incisions were made using an 18-gauge needle manual pressure was used to manipulate the toe to assist with breaking up scar tissue and then the area was injected with dexamethasone 1 cc over the first MPJ as well as the second MPJ and dressed with a compressive Coban splint dressing.  Advised patient to remove dressing later tonight and to use a Darco toe alignment splint to help stretch out the contracted soft tissues at the right great toe and second toe as tolerated. Advised patient to closely monitor and if there is any acute signs or symptoms of infection to call office for oral antibiotics however at this time none prescribed.  Patient tolerated procedure and injection into scar tissue  well without complication. -Patient to follow-up with EJ for orthotic assessment to offload plantar forefoot on the right.  Patient needs a carveout to offload the problem areas and not a buildup because a buildup causes rubbing to toes  -Patient to follow-up as scheduled for postoperative care or sooner if problems or issues arise.   Landis Martins, DPM

## 2021-01-01 DIAGNOSIS — Z23 Encounter for immunization: Secondary | ICD-10-CM | POA: Diagnosis not present

## 2021-01-01 DIAGNOSIS — M4312 Spondylolisthesis, cervical region: Secondary | ICD-10-CM | POA: Diagnosis not present

## 2021-01-01 DIAGNOSIS — R9431 Abnormal electrocardiogram [ECG] [EKG]: Secondary | ICD-10-CM | POA: Diagnosis not present

## 2021-01-01 DIAGNOSIS — R079 Chest pain, unspecified: Secondary | ICD-10-CM | POA: Diagnosis not present

## 2021-01-01 DIAGNOSIS — I672 Cerebral atherosclerosis: Secondary | ICD-10-CM | POA: Diagnosis not present

## 2021-01-01 DIAGNOSIS — J9811 Atelectasis: Secondary | ICD-10-CM | POA: Diagnosis not present

## 2021-01-01 DIAGNOSIS — Z79899 Other long term (current) drug therapy: Secondary | ICD-10-CM | POA: Diagnosis not present

## 2021-01-01 DIAGNOSIS — M5030 Other cervical disc degeneration, unspecified cervical region: Secondary | ICD-10-CM | POA: Diagnosis not present

## 2021-01-01 DIAGNOSIS — S199XXA Unspecified injury of neck, initial encounter: Secondary | ICD-10-CM | POA: Diagnosis not present

## 2021-01-01 DIAGNOSIS — Z981 Arthrodesis status: Secondary | ICD-10-CM | POA: Diagnosis not present

## 2021-01-01 DIAGNOSIS — I6523 Occlusion and stenosis of bilateral carotid arteries: Secondary | ICD-10-CM | POA: Diagnosis not present

## 2021-01-01 DIAGNOSIS — S0101XA Laceration without foreign body of scalp, initial encounter: Secondary | ICD-10-CM | POA: Diagnosis not present

## 2021-01-01 DIAGNOSIS — D7389 Other diseases of spleen: Secondary | ICD-10-CM | POA: Diagnosis not present

## 2021-01-09 DIAGNOSIS — M5442 Lumbago with sciatica, left side: Secondary | ICD-10-CM | POA: Diagnosis not present

## 2021-01-09 DIAGNOSIS — Z9689 Presence of other specified functional implants: Secondary | ICD-10-CM | POA: Diagnosis not present

## 2021-01-09 DIAGNOSIS — M961 Postlaminectomy syndrome, not elsewhere classified: Secondary | ICD-10-CM | POA: Diagnosis not present

## 2021-01-09 DIAGNOSIS — G894 Chronic pain syndrome: Secondary | ICD-10-CM | POA: Diagnosis not present

## 2021-01-15 DIAGNOSIS — R5383 Other fatigue: Secondary | ICD-10-CM | POA: Diagnosis not present

## 2021-01-15 DIAGNOSIS — R9431 Abnormal electrocardiogram [ECG] [EKG]: Secondary | ICD-10-CM | POA: Diagnosis not present

## 2021-01-15 DIAGNOSIS — D734 Cyst of spleen: Secondary | ICD-10-CM | POA: Diagnosis not present

## 2021-01-23 ENCOUNTER — Ambulatory Visit: Payer: Medicare Other | Admitting: Sports Medicine

## 2021-01-25 ENCOUNTER — Encounter: Payer: Self-pay | Admitting: Sports Medicine

## 2021-01-25 ENCOUNTER — Other Ambulatory Visit: Payer: Self-pay

## 2021-01-25 ENCOUNTER — Ambulatory Visit: Payer: Medicare Other | Admitting: Sports Medicine

## 2021-01-25 DIAGNOSIS — L570 Actinic keratosis: Secondary | ICD-10-CM

## 2021-01-25 DIAGNOSIS — M79671 Pain in right foot: Secondary | ICD-10-CM

## 2021-01-25 DIAGNOSIS — M6240 Contracture of muscle, unspecified site: Secondary | ICD-10-CM

## 2021-01-25 DIAGNOSIS — M205X1 Other deformities of toe(s) (acquired), right foot: Secondary | ICD-10-CM

## 2021-01-25 DIAGNOSIS — Z9889 Other specified postprocedural states: Secondary | ICD-10-CM | POA: Diagnosis not present

## 2021-01-25 DIAGNOSIS — L905 Scar conditions and fibrosis of skin: Secondary | ICD-10-CM | POA: Diagnosis not present

## 2021-01-25 NOTE — Progress Notes (Signed)
Subjective: Sonya Allen is a 65 y.o. female patient seen today in office for follow-up evaluation of scar tissue to right great toe after in office needling procedure to try to relieve some of the scar tissue.  Patient reports that things are the same the toe still sticks up and reports that she still gets a little bit of callus at the bottom of her foot has recently tried to trim it and cut herself.  Denies any other symptoms at this time. Patient Active Problem List   Diagnosis Date Noted  . Arthritis of carpometacarpal [facility]) joint of left thumb 06/21/2020  . Left carpal tunnel syndrome 06/21/2020  . Recurrent right knee instability 12/31/2017  . Acute pain of left shoulder 08/13/2017  . 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 Knee pain, right 01/23/2014  . 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  . Unilateral primary osteoarthritis, right knee 05/26/2013  . Other specified postprocedural states 01/31/2013    Current Outpatient Medications on File Prior to Visit  Medication Sig Dispense Refill  . betamethasone acetate-betamethasone sodium phosphate (CELESTONE) 6 (3-3) MG/ML injection Inject 12 mg into the articular space.    . bupivacaine, PF, (MARCAINE) 0.25 % SOLN injection Inject 25 mg into the skin.    . carisoprodol (SOMA) 350 MG tablet Take 350 mg by mouth 3 (three) times daily.    . carisoprodol (SOMA) 350 MG tablet     . clindamycin (CLEOCIN) 150 MG capsule Take 1 capsule (150 mg total) by mouth 2 (two) times daily. 14 capsule 0  . clonazePAM (KLONOPIN) 0.5 MG tablet Take 0.5 mg by mouth at bedtime. Restless legs    . clonazePAM (KLONOPIN) 0.5 MG tablet     . clonazePAM (KLONOPIN) 1 MG tablet     . conjugated estrogens (PREMARIN) vaginal cream     . diclofenac (CATAFLAM) 50 MG tablet     . diclofenac (VOLTAREN) 50 MG EC tablet     . diclofenac sodium (VOLTAREN) 1 % GEL     . docusate sodium (COLACE) 100 MG capsule Take 1 capsule (100 mg total) by mouth 2 (two) times  daily. 10 capsule 0  . doxycycline (VIBRA-TABS) 100 MG tablet Take 1 tablet (100 mg total) by mouth 2 (two) times daily. 20 tablet 0  . enoxaparin (LOVENOX) 40 MG/0.4ML injection Inject 0.4 mLs (40 mg total) into the skin daily. 12 Syringe 0  . escitalopram (LEXAPRO) 10 MG tablet     . furosemide (LASIX) 20 MG tablet Take 20 mg by mouth daily.    . furosemide (LASIX) 20 MG tablet     . GRALISE 600 MG TABS     . HYDROmorphone HCl (EXALGO) 12 MG T24A SR tablet     . Hylan 48 MG/6ML SOSY Inject 48 mg into the articular space.    Marland Kitchen ibuprofen (ADVIL) 800 MG tablet Take 1 tablet (800 mg total) by mouth every 8 (eight) hours as needed. 30 tablet 0  . lamoTRIgine (LAMICTAL) 100 MG tablet     . Lifitegrast (XIIDRA) 5 % SOLN     . LINZESS 290 MCG CAPS capsule     . methocarbamol (ROBAXIN) 500 MG tablet Take 1-2 tablets (500-1,000 mg total) by mouth every 6 (six) hours as needed for muscle spasms. 60 tablet 2  . morphine (MS CONTIN) 30 MG 12 hr tablet Take by mouth.    . morphine (MS CONTIN) 60 MG 12 hr tablet Take 60 mg by mouth every 12 (twelve) hours.    Marland Kitchen  MOVANTIK 12.5 MG TABS     . NUVIGIL 150 MG tablet     . ondansetron (ZOFRAN) 4 MG tablet     . ondansetron (ZOFRAN) 4 MG tablet     . oxycodone (ROXICODONE) 30 MG immediate release tablet Take 1 tablet (30 mg total) by mouth every 8 (eight) hours as needed (for breakthrough pain). 100 tablet 0  . oxyCODONE-acetaminophen (PERCOCET) 10-325 MG tablet     . Polyethyl Glycol-Propyl Glycol (SYSTANE OP) Apply 1 drop to eye 2 (two) times daily as needed (dry eyes).    . pramipexole (MIRAPEX) 0.125 MG tablet     . pravastatin (PRAVACHOL) 40 MG tablet Take 40 mg by mouth daily.    . pravastatin (PRAVACHOL) 40 MG tablet Take by mouth.    . PredniSONE 10 MG KIT Take as instructed for 12 days and a tapering dose 48 each 0  . pregabalin (LYRICA) 100 MG capsule Take by mouth daily.  0  . pregabalin (LYRICA) 150 MG capsule Take 150 mg by mouth daily.    .  promethazine (PHENERGAN) 25 MG tablet Take 1 tablet (25 mg total) by mouth every 8 (eight) hours as needed for nausea or vomiting. 20 tablet 0  . sulfamethoxazole-trimethoprim (BACTRIM) 400-80 MG tablet Take 1 tablet by mouth 2 (two) times daily. 28 tablet 1  . Suvorexant (BELSOMRA) 15 MG TABS     . terbinafine (LAMISIL) 250 MG tablet Take 1 tablet (250 mg total) by mouth daily. 90 tablet 0  . tiZANidine (ZANAFLEX) 4 MG tablet     . topiramate (TOPAMAX) 100 MG tablet Take 100 mg by mouth 2 (two) times daily.    Marland Kitchen topiramate (TOPAMAX) 50 MG tablet     . venlafaxine XR (EFFEXOR-XR) 150 MG 24 hr capsule Take 150 mg by mouth daily.    Marland Kitchen venlafaxine XR (EFFEXOR-XR) 150 MG 24 hr capsule     . Vitamin D, Ergocalciferol, (DRISDOL) 50000 units CAPS capsule     . ZETIA 10 MG tablet      Current Facility-Administered Medications on File Prior to Visit  Medication Dose Route Frequency Provider Last Rate Last Admin  . triamcinolone acetonide (KENALOG) 10 MG/ML injection 10 mg  10 mg Other Once Landis Martins, DPM      . triamcinolone acetonide (KENALOG) 10 MG/ML injection 10 mg  10 mg Other Once [place], Linde Wilensky, DPM      . triamcinolone acetonide (KENALOG) 10 MG/ML injection 10 mg  10 mg Other Once Landis Martins, DPM        Allergies  Allergen Reactions  . Pravastatin Sodium Other (See Comments)    Muscles  aches Muscles  aches   . Augmentin [Amoxicillin-Pot Clavulanate] Diarrhea    Objective: There were no vitals filed for this visit.  General: No acute distress, AAOx3  Right foot: + healed surgical site at first metatarsophalangeal joint no redness warmth swelling or drainage, there is still residual scar contracture noted at the dorsal first MPJ on the right greater than the second MPJ, mild reactive keratosis submet 1 and 2 on right, prominent 5th met head right foot, there is no current pain to the keratosis of met 1 but there is a small abrasion to the right plantar forefoot submet to and  from patient trimming her callus and cutting herself.  No pain with calf compression.  + elevated position of the first toe with severe valgus deformity of ankle and knee history of ankle fusion.  Assessment and Plan:  Problem List Items Addressed This Visit   None   Visit Diagnoses    Scar tissue    -  Primary   Hallux extensus, acquired, right       Contracture of tendon sheath       Keratosis       Right foot pain       S/P foot surgery, right          -Patient seen and evaluated -Discussed continued care -Advised patient since previous needling procedures have not really helped release much of the scar tissue there is no benefit of attempting another procedure at this time -Advised patient to try scaraway topical patch to wear during the day to help soften the scar if possible -Advised patient that at this point the Darco toe alignment splint is optional -Patient is scheduled to see EJ, and orthotist to modify for orthotics to further offload segment wanting to since previous orthotics were not offloaded enough -Patient to follow-up as scheduled or sooner if issues arise.  Landis Martins, DPM

## 2021-01-29 ENCOUNTER — Telehealth: Payer: Self-pay | Admitting: Sports Medicine

## 2021-01-29 NOTE — Telephone Encounter (Signed)
Pt left message stating she needed to r/s her appt for 6.10.  I returned call and left message on cell for her to call to r/s appt and the next available for EJ for the orthotic adjustment is 7.8.2022.Marland Kitchen

## 2021-02-01 ENCOUNTER — Other Ambulatory Visit: Payer: Medicare Other

## 2021-02-07 DIAGNOSIS — R600 Localized edema: Secondary | ICD-10-CM | POA: Insufficient documentation

## 2021-02-07 DIAGNOSIS — R112 Nausea with vomiting, unspecified: Secondary | ICD-10-CM

## 2021-02-07 DIAGNOSIS — H04123 Dry eye syndrome of bilateral lacrimal glands: Secondary | ICD-10-CM | POA: Insufficient documentation

## 2021-02-07 DIAGNOSIS — G2581 Restless legs syndrome: Secondary | ICD-10-CM | POA: Insufficient documentation

## 2021-02-07 DIAGNOSIS — E7800 Pure hypercholesterolemia, unspecified: Secondary | ICD-10-CM | POA: Insufficient documentation

## 2021-02-07 DIAGNOSIS — K5903 Drug induced constipation: Secondary | ICD-10-CM | POA: Insufficient documentation

## 2021-02-07 DIAGNOSIS — Z9889 Other specified postprocedural states: Secondary | ICD-10-CM | POA: Insufficient documentation

## 2021-02-07 DIAGNOSIS — Z87442 Personal history of urinary calculi: Secondary | ICD-10-CM | POA: Insufficient documentation

## 2021-02-07 DIAGNOSIS — M1990 Unspecified osteoarthritis, unspecified site: Secondary | ICD-10-CM | POA: Insufficient documentation

## 2021-02-07 DIAGNOSIS — J40 Bronchitis, not specified as acute or chronic: Secondary | ICD-10-CM | POA: Insufficient documentation

## 2021-02-07 DIAGNOSIS — M797 Fibromyalgia: Secondary | ICD-10-CM | POA: Insufficient documentation

## 2021-02-07 HISTORY — DX: Other specified postprocedural states: Z98.890

## 2021-02-07 HISTORY — DX: Nausea with vomiting, unspecified: R11.2

## 2021-02-11 DIAGNOSIS — Z9689 Presence of other specified functional implants: Secondary | ICD-10-CM | POA: Diagnosis not present

## 2021-02-11 DIAGNOSIS — G894 Chronic pain syndrome: Secondary | ICD-10-CM | POA: Diagnosis not present

## 2021-02-11 DIAGNOSIS — M5442 Lumbago with sciatica, left side: Secondary | ICD-10-CM | POA: Diagnosis not present

## 2021-02-11 DIAGNOSIS — M961 Postlaminectomy syndrome, not elsewhere classified: Secondary | ICD-10-CM | POA: Diagnosis not present

## 2021-02-12 DIAGNOSIS — N289 Disorder of kidney and ureter, unspecified: Secondary | ICD-10-CM | POA: Diagnosis not present

## 2021-02-12 DIAGNOSIS — D7389 Other diseases of spleen: Secondary | ICD-10-CM | POA: Diagnosis not present

## 2021-02-12 DIAGNOSIS — K760 Fatty (change of) liver, not elsewhere classified: Secondary | ICD-10-CM | POA: Diagnosis not present

## 2021-02-12 DIAGNOSIS — D734 Cyst of spleen: Secondary | ICD-10-CM | POA: Diagnosis not present

## 2021-02-19 NOTE — Progress Notes (Signed)
Cardiology Office Note:    Date:  02/20/2021   ID:  Sonya Allen, DOB 1956-02-12, MRN [id number]  PCP:  Philemon Kingdom, MD  Cardiologist:  Norman Herrlich, MD   Referring MD: Philemon Kingdom, MD  ASSESSMENT:    1. Nonspecific abnormal electrocardiogram (ECG) (EKG)   2. Hypotension due to drugs   3. Fall, subsequent encounter    PLAN:    In order of problems listed above:  Further evaluation echocardiogram a much better pool to determine the presence of cardiomyopathy or chamber enlargement.  I suspect her EKG changes are nonspecific. Although she did not faint she is having symptomatic hypotension likely related to her loop diuretic I asked her to stop but also may be affected by the combination of oxycodone and Klonopin. I feel comfortable her episode was a fall not syncope and it does not need ambulatory heart rhythm monitor.  Next appointment 4 weeks   Medication Adjustments/Labs and Tests Ordered: Current medicines are reviewed at length with the patient today.  Concerns regarding medicines are outlined above.  Orders Placed This Encounter  Procedures   ECHOCARDIOGRAM COMPLETE   No orders of the defined types were placed in this encounter.    Chief complaint, was told to be seen by you for my EKG by the ED staff  History of Present Illness:    Sonya Allen is a 65 y.o. female who is being seen today for the evaluation of an abnormal EKG at the request of Philemon Kingdom, MD.  She was seen at [facility] ED 01/01/2021 with a fall and a scalp laceration.  CBC showed a hemoglobin of 12.8 BMP showed a creatinine 0.60 potassium 4.1 her EKG independently reviewed showed sinus rhythm right atrial enlargement.  She had a CTA of the chest performed showing normal cardiovascular structures no findings of pulmonary embolism.  Her EKG was interpreted as abnormal in the emergency room and she was referred to cardiology.  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 She has no known history of heart disease congenital rheumatic or atrial fibrillation. One-time early in life she fainted in the past. This episode was an accidental fall she thinks she may have tripped over a rug. She takes furosemide for lower extremity edema her blood pressure tends to run in the range of 100 105 systolic and recently she has had episodes as low as 70-80 feeling lightheaded but she did not faint in this episode. She has had no nausea vomiting or diarrhea no new medications to account for low blood pressure. She takes oxycodone 3-4 times a day and takes Klonopin at bedtime. She has had no chest pain shortness of breath palpitation or syncope.  Past Medical History:  Diagnosis Date   Arthritis    Bilateral dry eyes    Takes Systane Drops   Bronchitis    hx of   Constipation due to pain medication    Edema of extremities    Takes Lasix   Fibromyalgia    History of kidney stones    Hypercholesteremia    PONV (postoperative nausea and vomiting)    PONV (postoperative nausea and vomiting) 02/07/2021   Restless leg syndrome    Takes Klonopin    Past Surgical History:  Procedure Laterality Date   ABDOMINAL HYSTERECTOMY     ANKLE FUSION Right    BACK SURGERY     x 6 lumbar & cervical   CARPAL TUNNEL RELEASE Bilateral  KNEE ARTHROSCOPY Bilateral    ROTATOR CUFF REPAIR Right    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 Swallow whole.   atorvastatin (LIPITOR) 10 MG tablet Take 10 mg by mouth daily.   clonazePAM (KLONOPIN) 0.5 MG tablet Take 0.5 mg by mouth at bedtime. Restless legs   diclofenac sodium (VOLTAREN) 1 % GEL Apply topically 2 (two) times daily.   escitalopram (LEXAPRO) 10 MG tablet Take 10 mg by mouth  daily.   ibuprofen (ADVIL) 800 MG tablet Take 1 tablet (800 mg total) by mouth every 8 (eight) hours as needed. (Patient taking differently: Take 800 mg by mouth every 8 (eight) hours as needed for mild pain.)   Multiple Vitamins-Minerals (VISION VITAMINS PO) Take 1 tablet by mouth daily.   oxyCODONE-acetaminophen (PERCOCET) 10-325 MG tablet 1 tablet every 6 (six) hours as needed for pain.   pregabalin (LYRICA) 100 MG capsule Take by mouth daily.   tiZANidine (ZANAFLEX) 4 MG tablet Take 4 mg by mouth 3 (three) times daily.   venlafaxine XR (EFFEXOR-XR) 150 MG 24 hr capsule Take 150 mg by mouth daily.   ZETIA 10 MG tablet Take 10 mg by mouth daily.   [DISCONTINUED] furosemide (LASIX) 20 MG tablet Take 20 mg by mouth daily.   Current Facility-Administered Medications for the 02/20/21 encounter (Office Visit) with Baldo Daub, MD  Medication   triamcinolone acetonide (KENALOG) 10 MG/ML injection 10 mg   triamcinolone acetonide (KENALOG) 10 MG/ML injection 10 mg   triamcinolone acetonide (KENALOG) 10 MG/ML injection 10 mg     Allergies:   Pravastatin sodium and Augmentin [amoxicillin-pot clavulanate]   Social History   Socioeconomic History   Marital status: Married    Spouse name: Not on file   Number of children: Not on file   Years of education: Not on file   Highest education level: Not on file  Occupational History   Not on file  Tobacco Use   Smoking status: Some Days    Packs/day: 0.50    Years: 29.00    Pack years: 14.50    Types: Cigarettes   Smokeless tobacco: Never  Substance and Sexual Activity   Alcohol use: No   Drug use: Yes    Types: Morphine, Oxycodone   Sexual activity: Not on file  Other Topics Concern   Not on file  Social History Narrative   Not on file   Social Determinants of Health   Financial Resource Strain: Not on file  Food Insecurity: Not on file  Transportation Needs: Not on file  Physical Activity: Not on file  Stress: Not on file   Social Connections: Not on file     Family History: The patient's family history includes Cancer in her mother; Diabetes in her sister; Heart Problems in her father and sister.  ROS:   ROS Please see the history of present illness.     All other systems reviewed and are negative.  EKGs/Labs/Other Studies Reviewed:    The following studies were reviewed today:   EKG:  EKG from the emergency room is independently reviewed she did have right atrial enlargement sinus rhythm.   Physical Exam:    VS:  BP 93/63 (BP Location: Left Arm, Patient Position: Sitting, Cuff Size: Normal)  Pulse (!) 56   Ht 5\' 8"  (1.727 m)   Wt 190 lb 12.8 oz (86.5 kg)   SpO2 96%   BMI 29.01 kg/m     Wt Readings from Last 3 Encounters:  02/20/21 190 lb 12.8 oz (86.5 kg)  07/25/13 182 lb 6 oz (82.7 kg)  07/15/13 182 lb 6 oz (82.7 kg)  Blood pressure by me right arm sitting 96/60 standing 106/70  GEN: Appears her age well nourished, well developed in no acute distress HEENT: Normal NECK: No JVD; No carotid bruits LYMPHATICS: No lymphadenopathy CARDIAC: RRR, no murmurs, rubs, gallops RESPIRATORY:  Clear to auscultation without rales, wheezing or rhonchi  ABDOMEN: Soft, non-tender, non-distended MUSCULOSKELETAL:  No edema; No deformity  SKIN: Warm and dry NEUROLOGIC:  Alert and oriented x 3 PSYCHIATRIC:  Normal affect     Signed, 07/17/13, MD  02/20/2021 3:41 PM    [facility]

## 2021-02-20 ENCOUNTER — Encounter: Payer: Self-pay | Admitting: Cardiology

## 2021-02-20 ENCOUNTER — Other Ambulatory Visit: Payer: Self-pay

## 2021-02-20 ENCOUNTER — Ambulatory Visit: Payer: BLUE CROSS/BLUE SHIELD | Admitting: Cardiology

## 2021-02-20 VITALS — BP 93/63 | HR 56 | Ht 68.0 in | Wt 190.8 lb

## 2021-02-20 DIAGNOSIS — W19XXXD Unspecified fall, subsequent encounter: Secondary | ICD-10-CM

## 2021-02-20 DIAGNOSIS — I952 Hypotension due to drugs: Secondary | ICD-10-CM | POA: Diagnosis not present

## 2021-02-20 DIAGNOSIS — R9431 Abnormal electrocardiogram [ECG] [EKG]: Secondary | ICD-10-CM | POA: Diagnosis not present

## 2021-02-20 NOTE — Patient Instructions (Signed)
Medication Instructions:  Your physician has recommended you make the following change in your medication:  STOP: Furosemide  *If you need a refill on your cardiac medications before your next appointment, please call your pharmacy*   Lab Work: None If you have labs (blood work) drawn today and your tests are completely normal, you will receive your results only by: MyChart Message (if you have MyChart) OR A paper copy in the mail If you have any lab test that is abnormal or we need to change your treatment, we will call you to review the results.   Testing/Procedures: Your physician has requested that you have an echocardiogram. Echocardiography is a painless test that uses sound waves to create images of your heart. It provides your doctor with information about the size and shape of your heart and how well your heart's chambers and valves are working. This procedure takes approximately one hour. There are no restrictions for this procedure.    Follow-Up: At [facility], you and your health needs are our priority.  As part of our continuing mission to provide you with exceptional heart care, we have created designated Provider Care Teams.  These Care Teams include your primary Cardiologist (physician) and Advanced Practice Providers (APPs -  Physician Assistants and Nurse Practitioners) who all work together to provide you with the care you need, when you need it.  We recommend signing up for the patient portal called "MyChart".  Sign up information is provided on this After Visit Summary.  MyChart is used to connect with patients for Virtual Visits (Telemedicine).  Patients are able to view lab/test results, encounter notes, upcoming appointments, etc.  Non-urgent messages can be sent to your provider as well.   To learn more about what you can do with MyChart, go to [email or web address].    Your next appointment:   4 week(s)  The format for your next appointment:   In  Person  Provider:   Norman Herrlich, MD   Other Instructions

## 2021-03-08 ENCOUNTER — Other Ambulatory Visit: Payer: Medicare Other

## 2021-03-11 DIAGNOSIS — Z79899 Other long term (current) drug therapy: Secondary | ICD-10-CM | POA: Diagnosis not present

## 2021-03-11 DIAGNOSIS — M5442 Lumbago with sciatica, left side: Secondary | ICD-10-CM | POA: Diagnosis not present

## 2021-03-11 DIAGNOSIS — Z9689 Presence of other specified functional implants: Secondary | ICD-10-CM | POA: Diagnosis not present

## 2021-03-11 DIAGNOSIS — M961 Postlaminectomy syndrome, not elsewhere classified: Secondary | ICD-10-CM | POA: Diagnosis not present

## 2021-03-11 DIAGNOSIS — Z79891 Long term (current) use of opiate analgesic: Secondary | ICD-10-CM | POA: Diagnosis not present

## 2021-03-11 DIAGNOSIS — G894 Chronic pain syndrome: Secondary | ICD-10-CM | POA: Diagnosis not present

## 2021-03-12 ENCOUNTER — Other Ambulatory Visit: Payer: Medicare Other

## 2021-03-18 ENCOUNTER — Other Ambulatory Visit: Payer: Self-pay

## 2021-03-18 ENCOUNTER — Ambulatory Visit (INDEPENDENT_AMBULATORY_CARE_PROVIDER_SITE_OTHER): Payer: Medicare Other

## 2021-03-18 DIAGNOSIS — R9431 Abnormal electrocardiogram [ECG] [EKG]: Secondary | ICD-10-CM

## 2021-03-18 NOTE — Progress Notes (Signed)
Complete echocardiogram performed.  Jimmy Dechelle Attaway RDCS, RVT  

## 2021-03-19 ENCOUNTER — Telehealth: Payer: Self-pay

## 2021-03-19 LAB — ECHOCARDIOGRAM COMPLETE
Area-P 1/2: 5.84 cm2
S' Lateral: 2.6 cm

## 2021-03-19 NOTE — Telephone Encounter (Signed)
-----   Message from Baldo Daub, MD sent at 03/19/2021  1:49 PM EDT ----- Normal or stable result

## 2021-03-19 NOTE — Telephone Encounter (Signed)
Spoke with patient regarding results and recommendation.  Patient verbalizes understanding and is agreeable to plan of care. Advised patient to call back with any issues or concerns.  

## 2021-03-28 ENCOUNTER — Ambulatory Visit: Payer: Medicare Other | Admitting: Cardiology

## 2021-03-28 NOTE — Progress Notes (Deleted)
Cardiology Office Note:    Date:  03/28/2021   ID:  Sonya Allen, DOB 29-Jul-1956, MRN [id number]  PCP:  Sonya Kingdom, MD  Cardiologist:  Norman Herrlich, MD    Referring MD: Sonya Kingdom, MD    ASSESSMENT:    No diagnosis found. PLAN:    In order of problems listed above:  ***   Next appointment: ***   Medication Adjustments/Labs and Tests Ordered: Current medicines are reviewed at length with the patient today.  Concerns regarding medicines are outlined above.  No orders of the defined types were placed in this encounter.  No orders of the defined types were placed in this encounter.   No chief complaint on file.   History of Present Illness:    Sonya Allen is a 65 y.o. female with a hx of orthostatic hypotension with fall trauma and scalp laceration and an abnormal EKG showing right atrial enlargement.  She was last seen 02/20/2021. Compliance with diet, lifestyle and medications: ***  She underwent echocardiogram 03/18/2021.  Normal left ventricular size wall thickness systolic and diastolic function.  Right ventricle is normal size and function normal pulmonary artery pressure both atria are more normal in size and no significant valvular abnormality was present. Past Medical History:  Diagnosis Date   Arthritis    Bilateral dry eyes    Takes Systane Drops   Bronchitis    hx of   Constipation due to pain medication    Edema of extremities    Takes Lasix   Fibromyalgia    History of kidney stones    Hypercholesteremia    PONV (postoperative nausea and vomiting)    PONV (postoperative nausea and vomiting) 02/07/2021   Restless leg syndrome    Takes Klonopin    Past Surgical History:  Procedure Laterality Date   ABDOMINAL HYSTERECTOMY     ANKLE FUSION Right    BACK SURGERY     x 6 lumbar & cervical   CARPAL TUNNEL RELEASE Bilateral    KNEE ARTHROSCOPY Bilateral    ROTATOR CUFF REPAIR Right    TOTAL KNEE ARTHROPLASTY Left 07/25/2013    Procedure: TOTAL KNEE ARTHROPLASTY;  Surgeon: Dannielle Huh, MD;  Location: [facility] OR;  Service: Orthopedics;  Laterality: Left;    Current Medications: No outpatient medications have been marked as taking for the 03/28/21 encounter (Appointment) with Baldo Daub, MD.   Current Facility-Administered Medications for the 03/28/21 encounter (Appointment) with Baldo Daub, MD  Medication   triamcinolone acetonide (KENALOG) 10 MG/ML injection 10 mg   triamcinolone acetonide (KENALOG) 10 MG/ML injection 10 mg   triamcinolone acetonide (KENALOG) 10 MG/ML injection 10 mg     Allergies:   Pravastatin sodium and Augmentin [amoxicillin-pot clavulanate]   Social History   Socioeconomic History   Marital status: Married    Spouse name: Not on file   Number of children: Not on file   Years of education: Not on file   Highest education level: Not on file  Occupational History   Not on file  Tobacco Use   Smoking status: Some Days    Packs/day: 0.50    Years: 29.00    Pack years: 14.50    Types: Cigarettes   Smokeless tobacco: Never  Substance and Sexual Activity   Alcohol use: No   Drug use: Yes    Types: Morphine, Oxycodone   Sexual activity: Not on file  Other Topics Concern   Not on file  Social History Narrative  Not on file   Social Determinants of Health   Financial Resource Strain: Not on file  Food Insecurity: Not on file  Transportation Needs: Not on file  Physical Activity: Not on file  Stress: Not on file  Social Connections: Not on file     Family History: The patient's ***family history includes Cancer in her mother; Diabetes in her sister; Heart Problems in her father and sister. ROS:   Please see the history of present illness.    All other systems reviewed and are negative.  EKGs/Labs/Other Studies Reviewed:    The following studies were reviewed today:  EKG:  EKG ordered today and personally reviewed.  The ekg ordered today demonstrates ***  Recent  Labs: No results found for requested labs within last 8760 hours.  Recent Lipid Panel No results found for: CHOL, TRIG, HDL, CHOLHDL, VLDL, LDLCALC, LDLDIRECT  Physical Exam:    VS:  There were no vitals taken for this visit.    Wt Readings from Last 3 Encounters:  02/20/21 190 lb 12.8 oz (86.5 kg)  07/25/13 182 lb 6 oz (82.7 kg)  07/15/13 182 lb 6 oz (82.7 kg)     GEN: *** Well nourished, well developed in no acute distress HEENT: Normal NECK: No JVD; No carotid bruits LYMPHATICS: No lymphadenopathy CARDIAC: ***RRR, no murmurs, rubs, gallops RESPIRATORY:  Clear to auscultation without rales, wheezing or rhonchi  ABDOMEN: Soft, non-tender, non-distended MUSCULOSKELETAL:  No edema; No deformity  SKIN: Warm and dry NEUROLOGIC:  Alert and oriented x 3 PSYCHIATRIC:  Normal affect    Signed, Norman Herrlich, MD  03/28/2021 8:26 AM    [facility]

## 2021-04-12 ENCOUNTER — Other Ambulatory Visit: Payer: Medicare Other

## 2021-04-17 DIAGNOSIS — M5442 Lumbago with sciatica, left side: Secondary | ICD-10-CM | POA: Diagnosis not present

## 2021-04-17 DIAGNOSIS — M961 Postlaminectomy syndrome, not elsewhere classified: Secondary | ICD-10-CM | POA: Diagnosis not present

## 2021-04-17 DIAGNOSIS — G894 Chronic pain syndrome: Secondary | ICD-10-CM | POA: Diagnosis not present

## 2021-04-17 DIAGNOSIS — Z981 Arthrodesis status: Secondary | ICD-10-CM | POA: Diagnosis not present

## 2021-05-06 DIAGNOSIS — M961 Postlaminectomy syndrome, not elsewhere classified: Secondary | ICD-10-CM | POA: Diagnosis not present

## 2021-05-06 DIAGNOSIS — M5442 Lumbago with sciatica, left side: Secondary | ICD-10-CM | POA: Diagnosis not present

## 2021-05-06 DIAGNOSIS — G894 Chronic pain syndrome: Secondary | ICD-10-CM | POA: Diagnosis not present

## 2021-05-06 DIAGNOSIS — M25552 Pain in left hip: Secondary | ICD-10-CM | POA: Diagnosis not present

## 2021-05-09 ENCOUNTER — Other Ambulatory Visit: Payer: Self-pay | Admitting: Pain Medicine

## 2021-05-09 DIAGNOSIS — M5459 Other low back pain: Secondary | ICD-10-CM

## 2021-05-16 ENCOUNTER — Other Ambulatory Visit: Payer: Medicare Other

## 2021-05-17 DIAGNOSIS — M48061 Spinal stenosis, lumbar region without neurogenic claudication: Secondary | ICD-10-CM | POA: Diagnosis not present

## 2021-05-17 DIAGNOSIS — M5126 Other intervertebral disc displacement, lumbar region: Secondary | ICD-10-CM | POA: Diagnosis not present

## 2021-05-20 DIAGNOSIS — M25552 Pain in left hip: Secondary | ICD-10-CM | POA: Diagnosis not present

## 2021-05-20 DIAGNOSIS — M5442 Lumbago with sciatica, left side: Secondary | ICD-10-CM | POA: Diagnosis not present

## 2021-05-20 DIAGNOSIS — G894 Chronic pain syndrome: Secondary | ICD-10-CM | POA: Diagnosis not present

## 2021-05-20 DIAGNOSIS — M961 Postlaminectomy syndrome, not elsewhere classified: Secondary | ICD-10-CM | POA: Diagnosis not present

## 2021-05-21 DIAGNOSIS — M25552 Pain in left hip: Secondary | ICD-10-CM | POA: Diagnosis not present

## 2021-06-05 DIAGNOSIS — M545 Low back pain, unspecified: Secondary | ICD-10-CM | POA: Diagnosis not present

## 2021-06-12 DIAGNOSIS — M5442 Lumbago with sciatica, left side: Secondary | ICD-10-CM | POA: Diagnosis not present

## 2021-06-12 DIAGNOSIS — M25552 Pain in left hip: Secondary | ICD-10-CM | POA: Diagnosis not present

## 2021-06-12 DIAGNOSIS — M961 Postlaminectomy syndrome, not elsewhere classified: Secondary | ICD-10-CM | POA: Diagnosis not present

## 2021-06-12 DIAGNOSIS — G894 Chronic pain syndrome: Secondary | ICD-10-CM | POA: Diagnosis not present

## 2021-06-13 ENCOUNTER — Ambulatory Visit: Payer: Medicare Other | Admitting: Cardiology

## 2021-06-20 DIAGNOSIS — M1711 Unilateral primary osteoarthritis, right knee: Secondary | ICD-10-CM | POA: Diagnosis not present

## 2021-07-13 DIAGNOSIS — H9201 Otalgia, right ear: Secondary | ICD-10-CM | POA: Diagnosis not present

## 2021-07-15 DIAGNOSIS — M5442 Lumbago with sciatica, left side: Secondary | ICD-10-CM | POA: Diagnosis not present

## 2021-07-15 DIAGNOSIS — Z9689 Presence of other specified functional implants: Secondary | ICD-10-CM | POA: Diagnosis not present

## 2021-07-15 DIAGNOSIS — Z79891 Long term (current) use of opiate analgesic: Secondary | ICD-10-CM | POA: Diagnosis not present

## 2021-07-15 DIAGNOSIS — Z79899 Other long term (current) drug therapy: Secondary | ICD-10-CM | POA: Diagnosis not present

## 2021-07-15 DIAGNOSIS — M961 Postlaminectomy syndrome, not elsewhere classified: Secondary | ICD-10-CM | POA: Diagnosis not present

## 2021-07-15 DIAGNOSIS — G894 Chronic pain syndrome: Secondary | ICD-10-CM | POA: Diagnosis not present

## 2021-07-23 DIAGNOSIS — N289 Disorder of kidney and ureter, unspecified: Secondary | ICD-10-CM | POA: Diagnosis not present

## 2021-07-23 DIAGNOSIS — Z7189 Other specified counseling: Secondary | ICD-10-CM | POA: Diagnosis not present

## 2021-07-23 DIAGNOSIS — E785 Hyperlipidemia, unspecified: Secondary | ICD-10-CM | POA: Diagnosis not present

## 2021-07-23 DIAGNOSIS — F17210 Nicotine dependence, cigarettes, uncomplicated: Secondary | ICD-10-CM | POA: Diagnosis not present

## 2021-07-23 DIAGNOSIS — Z0001 Encounter for general adult medical examination with abnormal findings: Secondary | ICD-10-CM | POA: Diagnosis not present

## 2021-08-13 DIAGNOSIS — N289 Disorder of kidney and ureter, unspecified: Secondary | ICD-10-CM | POA: Diagnosis not present

## 2021-08-13 DIAGNOSIS — R160 Hepatomegaly, not elsewhere classified: Secondary | ICD-10-CM | POA: Diagnosis not present

## 2021-08-14 DIAGNOSIS — Z9689 Presence of other specified functional implants: Secondary | ICD-10-CM | POA: Diagnosis not present

## 2021-08-14 DIAGNOSIS — G894 Chronic pain syndrome: Secondary | ICD-10-CM | POA: Diagnosis not present

## 2021-08-14 DIAGNOSIS — M5442 Lumbago with sciatica, left side: Secondary | ICD-10-CM | POA: Diagnosis not present

## 2021-08-14 DIAGNOSIS — M961 Postlaminectomy syndrome, not elsewhere classified: Secondary | ICD-10-CM | POA: Diagnosis not present

## 2021-09-04 DIAGNOSIS — K760 Fatty (change of) liver, not elsewhere classified: Secondary | ICD-10-CM | POA: Diagnosis not present

## 2021-09-04 DIAGNOSIS — R160 Hepatomegaly, not elsewhere classified: Secondary | ICD-10-CM | POA: Diagnosis not present

## 2021-09-04 DIAGNOSIS — I700 Atherosclerosis of aorta: Secondary | ICD-10-CM | POA: Diagnosis not present

## 2021-09-19 DIAGNOSIS — M79606 Pain in leg, unspecified: Secondary | ICD-10-CM | POA: Diagnosis not present

## 2021-09-19 DIAGNOSIS — G894 Chronic pain syndrome: Secondary | ICD-10-CM | POA: Diagnosis not present

## 2021-09-19 DIAGNOSIS — M961 Postlaminectomy syndrome, not elsewhere classified: Secondary | ICD-10-CM | POA: Diagnosis not present

## 2021-09-19 DIAGNOSIS — M79673 Pain in unspecified foot: Secondary | ICD-10-CM | POA: Diagnosis not present

## 2021-10-04 DIAGNOSIS — M545 Low back pain, unspecified: Secondary | ICD-10-CM | POA: Diagnosis not present

## 2021-10-22 DIAGNOSIS — M791 Myalgia, unspecified site: Secondary | ICD-10-CM | POA: Diagnosis not present

## 2021-10-22 DIAGNOSIS — Z9689 Presence of other specified functional implants: Secondary | ICD-10-CM | POA: Diagnosis not present

## 2021-10-22 DIAGNOSIS — G894 Chronic pain syndrome: Secondary | ICD-10-CM | POA: Diagnosis not present

## 2021-10-24 DIAGNOSIS — G5602 Carpal tunnel syndrome, left upper limb: Secondary | ICD-10-CM | POA: Diagnosis not present

## 2021-10-24 DIAGNOSIS — M1812 Unilateral primary osteoarthritis of first carpometacarpal joint, left hand: Secondary | ICD-10-CM | POA: Diagnosis not present

## 2021-11-12 DIAGNOSIS — Z79891 Long term (current) use of opiate analgesic: Secondary | ICD-10-CM | POA: Diagnosis not present

## 2021-11-12 DIAGNOSIS — M545 Low back pain, unspecified: Secondary | ICD-10-CM | POA: Diagnosis not present

## 2021-11-12 DIAGNOSIS — M5137 Other intervertebral disc degeneration, lumbosacral region: Secondary | ICD-10-CM | POA: Diagnosis not present

## 2021-11-12 DIAGNOSIS — M961 Postlaminectomy syndrome, not elsewhere classified: Secondary | ICD-10-CM | POA: Diagnosis not present

## 2021-11-12 DIAGNOSIS — G894 Chronic pain syndrome: Secondary | ICD-10-CM | POA: Diagnosis not present

## 2021-11-25 DIAGNOSIS — T85122A Displacement of implanted electronic neurostimulator (electrode) of spinal cord, initial encounter: Secondary | ICD-10-CM | POA: Diagnosis not present

## 2021-11-25 DIAGNOSIS — M5416 Radiculopathy, lumbar region: Secondary | ICD-10-CM | POA: Diagnosis not present

## 2021-11-25 DIAGNOSIS — Z9889 Other specified postprocedural states: Secondary | ICD-10-CM | POA: Diagnosis not present

## 2021-12-09 DIAGNOSIS — M545 Low back pain, unspecified: Secondary | ICD-10-CM | POA: Diagnosis not present

## 2021-12-09 DIAGNOSIS — M961 Postlaminectomy syndrome, not elsewhere classified: Secondary | ICD-10-CM | POA: Diagnosis not present

## 2021-12-09 DIAGNOSIS — M5137 Other intervertebral disc degeneration, lumbosacral region: Secondary | ICD-10-CM | POA: Diagnosis not present

## 2021-12-09 DIAGNOSIS — G894 Chronic pain syndrome: Secondary | ICD-10-CM | POA: Diagnosis not present

## 2021-12-09 DIAGNOSIS — Z79891 Long term (current) use of opiate analgesic: Secondary | ICD-10-CM | POA: Diagnosis not present

## 2021-12-09 DIAGNOSIS — R202 Paresthesia of skin: Secondary | ICD-10-CM | POA: Diagnosis not present

## 2022-01-08 DIAGNOSIS — Z1231 Encounter for screening mammogram for malignant neoplasm of breast: Secondary | ICD-10-CM | POA: Diagnosis not present

## 2022-01-08 DIAGNOSIS — E785 Hyperlipidemia, unspecified: Secondary | ICD-10-CM | POA: Diagnosis not present

## 2022-01-08 DIAGNOSIS — Z79899 Other long term (current) drug therapy: Secondary | ICD-10-CM | POA: Diagnosis not present

## 2022-01-08 DIAGNOSIS — G4700 Insomnia, unspecified: Secondary | ICD-10-CM | POA: Diagnosis not present

## 2022-01-14 DIAGNOSIS — M1711 Unilateral primary osteoarthritis, right knee: Secondary | ICD-10-CM | POA: Diagnosis not present

## 2022-01-21 DIAGNOSIS — G894 Chronic pain syndrome: Secondary | ICD-10-CM | POA: Diagnosis not present

## 2022-01-21 DIAGNOSIS — M5137 Other intervertebral disc degeneration, lumbosacral region: Secondary | ICD-10-CM | POA: Diagnosis not present

## 2022-01-21 DIAGNOSIS — M545 Low back pain, unspecified: Secondary | ICD-10-CM | POA: Diagnosis not present

## 2022-01-21 DIAGNOSIS — R202 Paresthesia of skin: Secondary | ICD-10-CM | POA: Diagnosis not present

## 2022-01-21 DIAGNOSIS — M961 Postlaminectomy syndrome, not elsewhere classified: Secondary | ICD-10-CM | POA: Diagnosis not present

## 2022-01-21 DIAGNOSIS — Z79891 Long term (current) use of opiate analgesic: Secondary | ICD-10-CM | POA: Diagnosis not present

## 2022-02-16 DIAGNOSIS — M25572 Pain in left ankle and joints of left foot: Secondary | ICD-10-CM | POA: Diagnosis not present

## 2022-02-16 DIAGNOSIS — S8265XA Nondisplaced fracture of lateral malleolus of left fibula, initial encounter for closed fracture: Secondary | ICD-10-CM | POA: Diagnosis not present

## 2022-02-18 DIAGNOSIS — S93402A Sprain of unspecified ligament of left ankle, initial encounter: Secondary | ICD-10-CM | POA: Diagnosis not present

## 2022-02-18 DIAGNOSIS — M25562 Pain in left knee: Secondary | ICD-10-CM | POA: Diagnosis not present

## 2022-03-11 DIAGNOSIS — S8392XA Sprain of unspecified site of left knee, initial encounter: Secondary | ICD-10-CM | POA: Diagnosis not present

## 2022-03-11 DIAGNOSIS — Z96652 Presence of left artificial knee joint: Secondary | ICD-10-CM | POA: Diagnosis not present

## 2022-03-14 DIAGNOSIS — T85122A Displacement of implanted electronic neurostimulator (electrode) of spinal cord, initial encounter: Secondary | ICD-10-CM | POA: Diagnosis not present

## 2022-03-14 DIAGNOSIS — G8929 Other chronic pain: Secondary | ICD-10-CM | POA: Diagnosis not present

## 2022-03-20 DIAGNOSIS — M47816 Spondylosis without myelopathy or radiculopathy, lumbar region: Secondary | ICD-10-CM | POA: Diagnosis not present

## 2022-04-15 DIAGNOSIS — M5416 Radiculopathy, lumbar region: Secondary | ICD-10-CM | POA: Diagnosis not present

## 2022-04-16 DIAGNOSIS — Z1231 Encounter for screening mammogram for malignant neoplasm of breast: Secondary | ICD-10-CM | POA: Diagnosis not present

## 2022-04-25 DIAGNOSIS — N3091 Cystitis, unspecified with hematuria: Secondary | ICD-10-CM | POA: Diagnosis not present

## 2022-04-25 DIAGNOSIS — N3001 Acute cystitis with hematuria: Secondary | ICD-10-CM | POA: Diagnosis not present

## 2022-05-21 DIAGNOSIS — G8929 Other chronic pain: Secondary | ICD-10-CM | POA: Diagnosis not present

## 2022-06-04 DIAGNOSIS — M85832 Other specified disorders of bone density and structure, left forearm: Secondary | ICD-10-CM | POA: Diagnosis not present

## 2022-06-04 DIAGNOSIS — M85851 Other specified disorders of bone density and structure, right thigh: Secondary | ICD-10-CM | POA: Diagnosis not present

## 2022-06-16 DIAGNOSIS — R300 Dysuria: Secondary | ICD-10-CM | POA: Diagnosis not present

## 2022-06-16 DIAGNOSIS — R319 Hematuria, unspecified: Secondary | ICD-10-CM | POA: Diagnosis not present

## 2022-06-17 ENCOUNTER — Ambulatory Visit: Payer: Medicare Other | Admitting: Podiatry

## 2022-06-17 ENCOUNTER — Ambulatory Visit (INDEPENDENT_AMBULATORY_CARE_PROVIDER_SITE_OTHER): Payer: Medicare Other

## 2022-06-17 DIAGNOSIS — M216X1 Other acquired deformities of right foot: Secondary | ICD-10-CM | POA: Diagnosis not present

## 2022-06-17 DIAGNOSIS — M1711 Unilateral primary osteoarthritis, right knee: Secondary | ICD-10-CM | POA: Diagnosis not present

## 2022-06-17 DIAGNOSIS — M79671 Pain in right foot: Secondary | ICD-10-CM

## 2022-06-17 DIAGNOSIS — L84 Corns and callosities: Secondary | ICD-10-CM

## 2022-06-17 MED ORDER — UREA 40 % EX CREA: 1.0000 | TOPICAL_CREAM | Freq: Every day | CUTANEOUS | 1 refills | Status: DC

## 2022-06-19 NOTE — Progress Notes (Signed)
Subjective:   Patient ID: Sonya Allen, female   DOB: 66 y.o.   MRN: [id number]   HPI Chief Complaint  Patient presents with   Callouses    Had surgery in 2022 on right foot patient has callus that is causing pain   66 year old female presents the office today for concerns of painful callus on the bottom of her right foot pointing to submetatarsal 1 and 2 was causing quite a bit of pain.  She has had numerous surgeries present on the right foot which appear to start at the first MPJ implant arthroplasty, IPJ fusion and subsequent wanted to have MPJ arthrodesis.  This got infected and she had the hardware removed as well as the joint.  Currently no hardware in place.  She states that since the surgery her big toe has been sticking up and she had the callus and pain.   Review of Systems  All other systems reviewed and are negative.  Past Medical History:  Diagnosis Date   Arthritis    Bilateral dry eyes    Takes Systane Drops   Bronchitis    hx of   Constipation due to pain medication    Edema of extremities    Takes Lasix   Fibromyalgia    History of kidney stones    Hypercholesteremia    PONV (postoperative nausea and vomiting)    PONV (postoperative nausea and vomiting) 02/07/2021   Restless leg syndrome    Takes Klonopin    Past Surgical History:  Procedure Laterality Date   ABDOMINAL HYSTERECTOMY     ANKLE FUSION Right    BACK SURGERY     x 6 lumbar & cervical   CARPAL TUNNEL RELEASE Bilateral    KNEE ARTHROSCOPY Bilateral    ROTATOR CUFF REPAIR Right    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 Swallow whole., Disp: , Rfl:    atorvastatin (LIPITOR) 10 MG tablet, Take 10  mg by mouth daily., Disp: , Rfl:    clonazePAM (KLONOPIN) 0.5 MG tablet, Take 0.5 mg by mouth at bedtime. Restless legs, Disp: , Rfl:    diclofenac sodium (VOLTAREN) 1 % GEL, Apply topically 2 (two) times daily., Disp: , Rfl:    escitalopram (LEXAPRO) 10 MG tablet, Take 10 mg by mouth daily., Disp: , Rfl:    ibuprofen (ADVIL) 800 MG tablet, Take 1 tablet (800 mg total) by mouth every 8 (eight) hours as needed. (Patient taking differently: Take 800 mg by mouth every 8 (eight) hours as needed for mild pain.), Disp: 30 tablet, Rfl: 0   Multiple Vitamins-Minerals (VISION VITAMINS PO), Take 1 tablet by mouth daily., Disp: , Rfl:    oxyCODONE-acetaminophen (PERCOCET) 10-325 MG tablet, 1 tablet every 6 (six) hours as needed for pain., Disp: , Rfl:    pregabalin (LYRICA) 100 MG capsule, Take by mouth daily., Disp: , Rfl: 0   tiZANidine (ZANAFLEX) 4 MG tablet, Take 4 mg by mouth 3 (three) times daily., Disp: , Rfl:    venlafaxine XR (EFFEXOR-XR) 150 MG 24 hr capsule, Take 150 mg by mouth daily., Disp: , Rfl:    ZETIA 10 MG tablet, Take 10 mg by  mouth daily., Disp: , Rfl:   Current Facility-Administered Medications:    triamcinolone acetonide (KENALOG) 10 MG/ML injection 10 mg, 10 mg, Other, Once, Stover, Titorya, DPM   triamcinolone acetonide (KENALOG) 10 MG/ML injection 10 mg, 10 mg, Other, Once, Stover, Titorya, DPM   triamcinolone acetonide (KENALOG) 10 MG/ML injection 10 mg, 10 mg, Other, Once, Stover, Titorya, DPM  Allergies  Allergen Reactions   Pravastatin Sodium Other (See Comments)    Muscles  aches Muscles  aches    Augmentin [Amoxicillin-Pot Clavulanate] Diarrhea           Objective:  Physical Exam  General: AAO x3, NAD  Dermatological: Hyperkeratotic lesion submetatarsal 1 and 2 on the right foot without any underlying ulceration drainage or signs of infection but there was some dried blood under the callus no skin breakdown or warmth.  Vascular: Dorsalis Pedis artery and  Posterior Tibial artery pedal pulses are 2/4 bilateral with immedate capillary fill time.  There is no pain with calf compression, swelling, warmth, erythema.   Neruologic: Grossly intact via light touch bilateral.   Musculoskeletal: Decreased range of motion of first MPJ and the hallux is dorsiflexed with causing prominent submetatarsal 1-2.  Prominent metatarsal head on the second is present.  Gait: Unassisted, Nonantalgic.       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  3 views of the right foot were obtained.  Status post removal of the first MPJ.  Second metatarsal osteotomy present previously.  No evidence of acute fracture. -Difficult situation.  Discussed with conservative as well as surgical options.  She is not interested in surgical intervention at this time.  She is tried orthotics.  I debrided the callus without any complications.  Discussed urea cream and I dispensed a gel metatarsal pad.  Vivi Barrack DPM

## 2022-06-26 ENCOUNTER — Other Ambulatory Visit: Payer: Self-pay | Admitting: Podiatry

## 2022-06-26 DIAGNOSIS — M216X1 Other acquired deformities of right foot: Secondary | ICD-10-CM

## 2022-06-26 DIAGNOSIS — L84 Corns and callosities: Secondary | ICD-10-CM

## 2022-06-26 DIAGNOSIS — M79671 Pain in right foot: Secondary | ICD-10-CM

## 2022-07-03 DIAGNOSIS — Z79899 Other long term (current) drug therapy: Secondary | ICD-10-CM | POA: Diagnosis not present

## 2022-07-11 DIAGNOSIS — T85122A Displacement of implanted electronic neurostimulator (electrode) of spinal cord, initial encounter: Secondary | ICD-10-CM | POA: Diagnosis not present

## 2022-07-14 ENCOUNTER — Other Ambulatory Visit: Payer: Self-pay | Admitting: Neurological Surgery

## 2022-07-15 ENCOUNTER — Other Ambulatory Visit: Payer: Self-pay | Admitting: Neurological Surgery

## 2022-07-15 DIAGNOSIS — M899 Disorder of bone, unspecified: Secondary | ICD-10-CM | POA: Diagnosis not present

## 2022-07-24 DIAGNOSIS — M47816 Spondylosis without myelopathy or radiculopathy, lumbar region: Secondary | ICD-10-CM | POA: Diagnosis not present

## 2022-07-24 DIAGNOSIS — E785 Hyperlipidemia, unspecified: Secondary | ICD-10-CM | POA: Diagnosis not present

## 2022-07-24 DIAGNOSIS — M1711 Unilateral primary osteoarthritis, right knee: Secondary | ICD-10-CM | POA: Diagnosis not present

## 2022-07-24 NOTE — Pre-Procedure Instructions (Signed)
Surgical Instructions    Your procedure is scheduled on Tuesday, December 5th.  Report to [facility] Main Entrance "A" at 11:50 A.M., then check in with the Admitting office.  Call this number if you have problems the morning of surgery:  [phone]   If you have any questions prior to your surgery date call [phone]: Open Monday-Friday 8am-4pm    Remember:  Do not eat after midnight the night before your surgery  You may drink clear liquids until 10:50 AM the morning of your surgery.   Clear liquids allowed are: Water, Non-Citrus Juices (without pulp), Carbonated Beverages, Clear Tea, Black Coffee Only (NO MILK, CREAM OR POWDERED CREAMER of any kind), and Gatorade.    Take these medicines the morning of surgery with A SIP OF WATER  tiZANidine (ZANAFLEX)  venlafaxine XR (EFFEXOR-XR)  ZETIA   If needed: oxyCODONE-acetaminophen (PERCOCET)  promethazine (PHENERGAN)    As of today, STOP taking any Aspirin (unless otherwise instructed by your surgeon) Aleve, Naproxen, Ibuprofen, Motrin, Advil, Goody's, BC's, all herbal medications, fish oil, diclofenac Sodium (VOLTAREN) 1 % GEL and all vitamins.                     Do NOT Smoke (Tobacco/Vaping) for 24 hours prior to your procedure.  If you use a CPAP at night, you may bring your mask/headgear for your overnight stay.   Contacts, glasses, piercing's, hearing aid's, dentures or partials may not be worn into surgery, please bring cases for these belongings.    For patients admitted to the hospital, discharge time will be determined by your treatment team.   Patients discharged the day of surgery will not be allowed to drive home, and someone needs to stay with them for 24 hours.  SURGICAL WAITING ROOM VISITATION Patients having surgery or a procedure may have no more than 2 support people in the waiting area - these visitors may rotate.   Children under the age of 58 must have an adult with them who is not the patient. If the  patient needs to stay at the hospital during part of their recovery, the visitor guidelines for inpatient rooms apply. Pre-op nurse will coordinate an appropriate time for 1 support person to accompany patient in pre-op.  This support person may not rotate.   Please refer to the [facility] website for the visitor guidelines for Inpatients (after your surgery is over and you are in a regular room).    Special instructions:   [place]- Preparing For Surgery  Before surgery, you can play an important role. Because skin is not sterile, your skin needs to be as free of germs as possible. You can reduce the number of germs on your skin by washing with CHG (chlorahexidine gluconate) Soap before surgery.  CHG is an antiseptic cleaner which kills germs and bonds with the skin to continue killing germs even after washing.    Oral Hygiene is also important to reduce your risk of infection.  Remember - BRUSH YOUR TEETH THE MORNING OF SURGERY WITH YOUR REGULAR TOOTHPASTE  Please do not use if you have an allergy to CHG or antibacterial soaps. If your skin becomes reddened/irritated stop using the CHG.  Do not shave (including legs and underarms) for at least 48 hours prior to first CHG shower. It is OK to shave your face.  Please follow these instructions carefully.   Shower the NIGHT BEFORE SURGERY and the MORNING OF SURGERY  If you chose to wash your  hair, wash your hair first as usual with your normal shampoo.  After you shampoo, rinse your hair and body thoroughly to remove the shampoo.  Use CHG Soap as you would any other liquid soap. You can apply CHG directly to the skin and wash gently with a scrungie or a clean washcloth.   Apply the CHG Soap to your body ONLY FROM THE NECK DOWN.  Do not use on open wounds or open sores. Avoid contact with your eyes, ears, mouth and genitals (private parts). Wash Face and genitals (private parts)  with your normal soap.   Wash thoroughly, paying special  attention to the area where your surgery will be performed.  Thoroughly rinse your body with warm water from the neck down.  DO NOT shower/wash with your normal soap after using and rinsing off the CHG Soap.  Pat yourself dry with a CLEAN TOWEL.  Wear CLEAN PAJAMAS to bed the night before surgery  Place CLEAN SHEETS on your bed the night before your surgery  DO NOT SLEEP WITH PETS.   Day of Surgery: Take a shower with CHG soap. Do not wear jewelry or makeup Do not wear lotions, powders, perfumes, or deodorant. Do not shave 48 hours prior to surgery.   Do not bring valuables to the hospital. [facility] is not responsible for any belongings or valuables. Do not wear nail polish, gel polish, artificial nails, or any other type of covering on natural nails (fingers and toes) If you have artificial nails or gel coating that need to be removed by a nail salon, please have this removed prior to surgery. Artificial nails or gel coating may interfere with anesthesia's ability to adequately monitor your vital signs. Wear Clean/Comfortable clothing the morning of surgery Remember to brush your teeth WITH YOUR REGULAR TOOTHPASTE.   Please read over the following fact sheets that you were given.    If you received a COVID test during your pre-op visit  it is requested that you wear a mask when out in public, stay away from anyone that may not be feeling well and notify your surgeon if you develop symptoms. If you have been in contact with anyone that has tested positive in the last 10 days please notify you surgeon.

## 2022-07-25 ENCOUNTER — Other Ambulatory Visit: Payer: Self-pay

## 2022-07-25 ENCOUNTER — Encounter (HOSPITAL_COMMUNITY): Payer: Self-pay

## 2022-07-25 ENCOUNTER — Encounter (HOSPITAL_COMMUNITY)
Admission: RE | Admit: 2022-07-25 | Discharge: 2022-07-25 | Disposition: A | Discharge status: Home / Self Care | Payer: Medicare Other | Source: Ambulatory Visit | Attending: Neurological Surgery | Admitting: Neurological Surgery

## 2022-07-25 VITALS — BP 121/81 | HR 69 | Temp 98.5°F | Resp 18 | Ht 68.0 in | Wt 165.0 lb

## 2022-07-25 DIAGNOSIS — Z79899 Other long term (current) drug therapy: Secondary | ICD-10-CM | POA: Diagnosis not present

## 2022-07-25 DIAGNOSIS — M797 Fibromyalgia: Secondary | ICD-10-CM

## 2022-07-25 DIAGNOSIS — T85890A Other specified complication of nervous system prosthetic devices, implants and grafts, initial encounter: Secondary | ICD-10-CM | POA: Diagnosis not present

## 2022-07-25 DIAGNOSIS — Z981 Arthrodesis status: Secondary | ICD-10-CM | POA: Insufficient documentation

## 2022-07-25 DIAGNOSIS — Z01818 Encounter for other preprocedural examination: Secondary | ICD-10-CM | POA: Diagnosis not present

## 2022-07-25 DIAGNOSIS — F419 Anxiety disorder, unspecified: Secondary | ICD-10-CM | POA: Insufficient documentation

## 2022-07-25 DIAGNOSIS — E7800 Pure hypercholesterolemia, unspecified: Secondary | ICD-10-CM | POA: Diagnosis not present

## 2022-07-25 DIAGNOSIS — Y848 Other medical procedures as the cause of abnormal reaction of the patient, or of later complication, without mention of misadventure at the time of the procedure: Secondary | ICD-10-CM | POA: Diagnosis not present

## 2022-07-25 DIAGNOSIS — G2581 Restless legs syndrome: Secondary | ICD-10-CM | POA: Insufficient documentation

## 2022-07-25 DIAGNOSIS — Z87442 Personal history of urinary calculi: Secondary | ICD-10-CM

## 2022-07-25 HISTORY — DX: Depression, unspecified: F32.A

## 2022-07-25 HISTORY — DX: Anxiety disorder, unspecified: F41.9

## 2022-07-25 LAB — BASIC METABOLIC PANEL
Anion gap: 5 (ref 5–15)
BUN: 15 mg/dL (ref 8–23)
CO2: 28 mmol/L (ref 22–32)
Calcium: 8.2 mg/dL — ABNORMAL LOW (ref 8.9–10.3)
Chloride: 108 mmol/L (ref 98–111)
Creatinine, Ser: 0.74 mg/dL (ref 0.44–1.00)
GFR, Estimated: >60 mL/min (ref >60)
Glucose, Bld: 115 mg/dL — ABNORMAL HIGH (ref 70–99)
Potassium: 4.4 mmol/L (ref 3.5–5.1)
Sodium: 141 mmol/L (ref 135–145)

## 2022-07-25 LAB — CBC
HCT: 38.2 % (ref 36.0–46.0)
Hemoglobin: 12.9 g/dL (ref 12.0–15.0)
MCH: 34.1 pg — ABNORMAL HIGH (ref 26.0–34.0)
MCHC: 33.8 g/dL (ref 30.0–36.0)
MCV: 101.1 fL — ABNORMAL HIGH (ref 80.0–100.0)
Platelets: 238 10*3/uL (ref 150–400)
RBC: 3.78 MIL/uL — ABNORMAL LOW (ref 3.87–5.11)
RDW: 12.5 % (ref 11.5–15.5)
WBC: 9.8 10*3/uL (ref 4.0–10.5)
nRBC: 0 % (ref 0.0–0.2)

## 2022-07-25 LAB — SURGICAL PCR SCREEN
MRSA, PCR: POSITIVE — AB
Staphylococcus aureus: POSITIVE — AB

## 2022-07-25 NOTE — Progress Notes (Signed)
PCP - Dr. Philemon Kingdom Cardiologist - Dr. Norman Herrlich- last saw 02/20/21  PPM/ICD - denies   Chest x-ray - 07/15/13 EKG - 07/25/22 Stress Test - denies ECHO - 03/18/21 Cardiac Cath - denies  Sleep Study - denies   DM- denies   ASA/Blood Thinner Instructions: n/a   ERAS Protcol - yes, no drink   COVID TEST- n/a   Anesthesia review: yes, cardiac hx  Patient denies shortness of breath, fever, cough and chest pain at PAT appointment   All instructions explained to the patient, with a verbal understanding of the material. Patient agrees to go over the instructions while at home for a better understanding. The opportunity to ask questions was provided.

## 2022-07-28 NOTE — Progress Notes (Signed)
Anesthesia Chart Review:  Case: [id number] Date/Time: 07/29/22 1336   Procedure: REMOVAL OF SCS AND IPG - 3C   Anesthesia type: General   Pre-op diagnosis: MIGRATION OF SPINAL CORD STIMULATOR   Location: [place] OR ROOM 32 / [place] OR   Surgeons: Dawley, Theodoro Doing, DO       DISCUSSION: Patient is a 66 year old female scheduled for the above procedure.  History includes smoking, post-operative N/V, fibromyalgia, hypercholesterolemia, anxiety, RLS, spinal surgery (L4-5 GIll procedure, L4-S1 PLIF/posterolateral arthrodesis 04/21/05; C5-6 ACDF 10/12/06; L2-4 PLIF/posterolateral arthrodesis 04/19/08; removal C5-6 plate, C4-5/C6-7 ACDF 12/27/09; spinal cord stimulator 11/14/15), osteoarthritis (left TKA 07/25/13).  She had a cardiology evaluation by Dr. Bettina Gavia on 02/20/21. By notes, she had a fall with scalp laceration on 01/01/21. EKG showed SR, right atrial enlargement. CTA showed "normal cardiovascular structures no findings of pulmonary embolism." EKG was interpreted as abnormal in the ED, so referred to cardiology. She one prior episode of syncope, but 01/01/21 fall was a mechanical fall after tripping over a rug. She did report an episode of lightheadedness without fainting in setting of SBP ~ 100 (and on Lasix, oxycodone, and Klonopin). She denied chest pain, SOB, or palpitations.  He suspected her EKG changes were non-specific and did not feel she had a syncope episode; however, he did order an echo to evaluate EF or chamber enlargement. He also advised she stop Lasix (although still on 20 mg daily by current medication list). He did not recommend a ambulatory heart monitor since he did not feel her fall was related to syncope. 03/18/21 echo showed LVEF 60-65%, no regional wall motion abnormalities, normal RVSF, normal PASP, normal LA/RA size, mild TR.   She denied chest pain or SOB at PAT visit. EKG updated given history. EKG 07/25/22 EKG showed SR, LAE. PAT labs acceptable for OR.   Anesthesia team to evaluate on the  day of surgery.    VS: BP 121/81   Pulse 69   Temp 36.9 C   Resp 18   Ht 5\' 8"  (1.727 m)   Wt 74.8 kg   SpO2 99%   BMI 25.09 kg/m    PROVIDERS: Ernestene Kiel, MD is PCP  Shirlee More, MD is cardiologist    LABS: Labs reviewed: Acceptable for surgery. (all labs ordered are listed, but only abnormal results are displayed)  Labs Reviewed  SURGICAL PCR SCREEN - Abnormal; Notable for the following components:      Result Value   MRSA, PCR POSITIVE (*)    Staphylococcus aureus POSITIVE (*)    All other components within normal limits  BASIC METABOLIC PANEL - Abnormal; Notable for the following components:   Glucose, Bld 115 (*)    Calcium 8.2 (*)    All other components within normal limits  CBC - Abnormal; Notable for the following components:   RBC 3.78 (*)    MCV 101.1 (*)    MCH 34.1 (*)    All other components within normal limits     EKG: 07/25/22: Normal sinus rhythm Possible Left atrial enlargement Borderline ECG When compared with ECG of 15-Jul-2013 14:17, PREVIOUS ECG IS PRESENT Left atrial enlargement New since previous tracing Confirmed by Kirk Ruths [phone]) on 07/25/2022 3:14:54 PM   CV: Echo 03/18/21: IMPRESSIONS   1. Left ventricular ejection fraction, by estimation, is 60 to 65%. The  left ventricle has normal function. The left ventricle has no regional  wall motion abnormalities. Left ventricular diastolic parameters were  normal.   2. Right ventricular systolic  function is normal. The right ventricular  size is normal. There is normal pulmonary artery systolic pressure.   3. The mitral valve is normal in structure. No evidence of mitral valve  regurgitation. No evidence of mitral stenosis.   4. The aortic valve is normal in structure. Aortic valve regurgitation is  not visualized. No aortic stenosis is present.   5. The inferior vena cava is normal in size with greater than 50%  respiratory variability, suggesting right atrial  pressure of 3 mmHg.   Past Medical History:  Diagnosis Date   Anxiety    Arthritis    Bilateral dry eyes    Takes Systane Drops   Bronchitis    hx of   Constipation due to pain medication    Depression    Edema of extremities    Takes Lasix   Fibromyalgia    History of kidney stones    Hypercholesteremia    PONV (postoperative nausea and vomiting)    PONV (postoperative nausea and vomiting) 02/07/2021   Restless leg syndrome    Takes Klonopin    Past Surgical History:  Procedure Laterality Date   ABDOMINAL HYSTERECTOMY     ANKLE FUSION Right    BACK SURGERY     x 6 lumbar & cervical   CARPAL TUNNEL RELEASE Bilateral    KNEE ARTHROSCOPY Bilateral    ROTATOR CUFF REPAIR Right    TOTAL KNEE ARTHROPLASTY Left 07/25/2013   Procedure: TOTAL KNEE ARTHROPLASTY;  Surgeon: Dannielle Huh, MD;  Location: [facility] OR;  Service: Orthopedics;  Laterality: Left;    MEDICATIONS:  Aspirin-Acetaminophen-Caffeine (GOODYS EXTRA STRENGTH) 520-260-32.5 MG PACK   atorvastatin (LIPITOR) 10 MG tablet   beta carotene w/minerals (OCUVITE) tablet   clonazePAM (KLONOPIN) 1 MG tablet   diclofenac Sodium (VOLTAREN) 1 % GEL   furosemide (LASIX) 20 MG tablet   ibuprofen (ADVIL) 800 MG tablet   oxyCODONE-acetaminophen (PERCOCET) 7.5-325 MG tablet   pregabalin (LYRICA) 100 MG capsule   promethazine (PHENERGAN) 25 MG tablet   tiZANidine (ZANAFLEX) 4 MG tablet   urea (CARMOL) 40 % CREA   venlafaxine XR (EFFEXOR-XR) 150 MG 24 hr capsule   Vitamin E 450 MG (1000 UT) CAPS   ZETIA 10 MG tablet    triamcinolone acetonide (KENALOG) 10 MG/ML injection 10 mg   triamcinolone acetonide (KENALOG) 10 MG/ML injection 10 mg   triamcinolone acetonide (KENALOG) 10 MG/ML injection 10 mg    Shonna Chock, PA-C Surgical Short Stay/Anesthesiology [facility] Phone [phone] [facility] Phone [phone] 07/28/2022 10:29 AM

## 2022-07-28 NOTE — Anesthesia Preprocedure Evaluation (Signed)
Anesthesia Evaluation  Patient identified by MRN, date of birth, ID band Patient awake    Reviewed: Allergy & Precautions, NPO status , Patient's Chart, lab work & pertinent test results  History of Anesthesia Complications (+) PONV and history of anesthetic complications  Airway Mallampati: III  TM Distance: >3 FB Neck ROM: Full    Dental  (+) Dental Advisory Given, Partial Lower   Pulmonary Current Smoker and Patient abstained from smoking.   Pulmonary exam normal        Cardiovascular negative cardio ROS Normal cardiovascular exam     Neuro/Psych  PSYCHIATRIC DISORDERS Anxiety Depression     RLS   Neuromuscular disease    GI/Hepatic negative GI ROS, Neg liver ROS,,,  Endo/Other  negative endocrine ROS    Renal/GU negative Renal ROS     Musculoskeletal  (+) Arthritis ,  Fibromyalgia -, narcotic dependent  Abdominal   Peds  Hematology negative hematology ROS (+)   Anesthesia Other Findings B/l dry eyes   Reproductive/Obstetrics  s/p hysterectomy                              Anesthesia Physical Anesthesia Plan  ASA: 3  Anesthesia Plan: General   Post-op Pain Management: Tylenol PO (pre-op)* and Celebrex PO (pre-op)*   Induction: Intravenous  PONV Risk Score and Plan: 3 and Treatment may vary due to age or medical condition, Ondansetron, Dexamethasone and Midazolam  Airway Management Planned: Oral ETT  Additional Equipment: None  Intra-op Plan:   Post-operative Plan: Extubation in OR  Informed Consent: I have reviewed the patients History and Physical, chart, labs and discussed the procedure including the risks, benefits and alternatives for the proposed anesthesia with the patient or authorized representative who has indicated his/her understanding and acceptance.     Dental advisory given  Plan Discussed with: CRNA and Anesthesiologist  Anesthesia Plan Comments:         Anesthesia Quick Evaluation

## 2022-07-29 ENCOUNTER — Encounter (HOSPITAL_COMMUNITY): Payer: Self-pay | Admitting: Neurological Surgery

## 2022-07-29 ENCOUNTER — Other Ambulatory Visit: Payer: Self-pay

## 2022-07-29 ENCOUNTER — Encounter (HOSPITAL_COMMUNITY)
Admission: RE | Disposition: A | Discharge status: Home / Self Care | Payer: Self-pay | Source: Home / Self Care | Attending: Neurological Surgery

## 2022-07-29 ENCOUNTER — Ambulatory Visit (HOSPITAL_COMMUNITY)
Admission: RE | Admit: 2022-07-29 | Discharge: 2022-07-29 | Disposition: A | Discharge status: Home / Self Care | Payer: Medicare Other | Attending: Neurological Surgery | Admitting: Neurological Surgery

## 2022-07-29 ENCOUNTER — Ambulatory Visit (HOSPITAL_BASED_OUTPATIENT_CLINIC_OR_DEPARTMENT_OTHER): Payer: Medicare Other | Admitting: Certified Registered"

## 2022-07-29 ENCOUNTER — Ambulatory Visit (HOSPITAL_COMMUNITY): Payer: Medicare Other | Admitting: Vascular Surgery

## 2022-07-29 ENCOUNTER — Ambulatory Visit (HOSPITAL_COMMUNITY): Payer: Medicare Other

## 2022-07-29 DIAGNOSIS — F418 Other specified anxiety disorders: Secondary | ICD-10-CM

## 2022-07-29 DIAGNOSIS — T85122A Displacement of implanted electronic neurostimulator (electrode) of spinal cord, initial encounter: Secondary | ICD-10-CM

## 2022-07-29 DIAGNOSIS — X58XXXA Exposure to other specified factors, initial encounter: Secondary | ICD-10-CM | POA: Insufficient documentation

## 2022-07-29 DIAGNOSIS — T85112A Breakdown (mechanical) of implanted electronic neurostimulator (electrode) of spinal cord, initial encounter: Secondary | ICD-10-CM | POA: Diagnosis not present

## 2022-07-29 DIAGNOSIS — H04129 Dry eye syndrome of unspecified lacrimal gland: Secondary | ICD-10-CM | POA: Insufficient documentation

## 2022-07-29 DIAGNOSIS — M961 Postlaminectomy syndrome, not elsewhere classified: Secondary | ICD-10-CM | POA: Diagnosis not present

## 2022-07-29 DIAGNOSIS — F32A Depression, unspecified: Secondary | ICD-10-CM | POA: Diagnosis not present

## 2022-07-29 DIAGNOSIS — M797 Fibromyalgia: Secondary | ICD-10-CM | POA: Insufficient documentation

## 2022-07-29 DIAGNOSIS — F17210 Nicotine dependence, cigarettes, uncomplicated: Secondary | ICD-10-CM | POA: Diagnosis not present

## 2022-07-29 DIAGNOSIS — F419 Anxiety disorder, unspecified: Secondary | ICD-10-CM | POA: Insufficient documentation

## 2022-07-29 DIAGNOSIS — G2581 Restless legs syndrome: Secondary | ICD-10-CM | POA: Insufficient documentation

## 2022-07-29 DIAGNOSIS — Z981 Arthrodesis status: Secondary | ICD-10-CM | POA: Diagnosis not present

## 2022-07-29 DIAGNOSIS — T85890A Other specified complication of nervous system prosthetic devices, implants and grafts, initial encounter: Secondary | ICD-10-CM | POA: Insufficient documentation

## 2022-07-29 DIAGNOSIS — T85113A Breakdown (mechanical) of implanted electronic neurostimulator, generator, initial encounter: Secondary | ICD-10-CM | POA: Diagnosis not present

## 2022-07-29 DIAGNOSIS — M1990 Unspecified osteoarthritis, unspecified site: Secondary | ICD-10-CM | POA: Diagnosis not present

## 2022-07-29 DIAGNOSIS — G8929 Other chronic pain: Secondary | ICD-10-CM | POA: Insufficient documentation

## 2022-07-29 DIAGNOSIS — M545 Low back pain, unspecified: Secondary | ICD-10-CM | POA: Insufficient documentation

## 2022-07-29 HISTORY — PX: SPINAL CORD STIMULATOR REMOVAL: SHX5379

## 2022-07-29 SURGERY — LUMBAR SPINAL CORD STIMULATOR REMOVAL
Anesthesia: General | Site: Back

## 2022-07-29 MED ORDER — GOODYS EXTRA STRENGTH 520-260-32.5 MG PO PACK: 1.0000 | PACK | Freq: Every day | ORAL | 0 refills | Status: DC | PRN

## 2022-07-29 MED ORDER — CHLORHEXIDINE GLUCONATE 0.12 % MT SOLN
OROMUCOSAL | Status: AC
  Administered 2022-07-29: 15 mL via OROMUCOSAL
  Filled 2022-07-29: qty 15

## 2022-07-29 MED ORDER — SUGAMMADEX SODIUM 200 MG/2ML IV SOLN
INTRAVENOUS | Status: DC | PRN
  Administered 2022-07-29: 200 mg via INTRAVENOUS

## 2022-07-29 MED ORDER — LACTATED RINGERS IV SOLN: INTRAVENOUS | Status: DC | PRN

## 2022-07-29 MED ORDER — ONDANSETRON HCL 4 MG/2ML IJ SOLN
INTRAMUSCULAR | Status: DC | PRN
  Administered 2022-07-29: 4 mg via INTRAVENOUS

## 2022-07-29 MED ORDER — ONDANSETRON HCL 4 MG/2ML IJ SOLN: 4.0000 mg | Freq: Once | INTRAMUSCULAR | Status: DC | PRN

## 2022-07-29 MED ORDER — LACTATED RINGERS IV SOLN: INTRAVENOUS | Status: DC

## 2022-07-29 MED ORDER — CELECOXIB 200 MG PO CAPS: 200.0000 mg | ORAL_CAPSULE | Freq: Once | ORAL | Status: AC

## 2022-07-29 MED ORDER — CHLORHEXIDINE GLUCONATE CLOTH 2 % EX PADS: 6.0000 | MEDICATED_PAD | Freq: Once | CUTANEOUS | Status: DC

## 2022-07-29 MED ORDER — LIDOCAINE 2% (20 MG/ML) 5 ML SYRINGE
INTRAMUSCULAR | Status: AC
  Filled 2022-07-29: qty 5

## 2022-07-29 MED ORDER — LIDOCAINE-EPINEPHRINE 1 %-1:100000 IJ SOLN
INTRAMUSCULAR | Status: DC | PRN
  Administered 2022-07-29: 8 mL

## 2022-07-29 MED ORDER — BUPIVACAINE-EPINEPHRINE (PF) 0.5% -1:200000 IJ SOLN
INTRAMUSCULAR | Status: AC
  Filled 2022-07-29: qty 30

## 2022-07-29 MED ORDER — VANCOMYCIN HCL IN DEXTROSE 1-5 GM/200ML-% IV SOLN
INTRAVENOUS | Status: AC
  Administered 2022-07-29: 1000 mg via INTRAVENOUS
  Filled 2022-07-29: qty 200

## 2022-07-29 MED ORDER — CELECOXIB 200 MG PO CAPS
ORAL_CAPSULE | ORAL | Status: AC
  Administered 2022-07-29: 200 mg via ORAL
  Filled 2022-07-29: qty 1

## 2022-07-29 MED ORDER — ROCURONIUM BROMIDE 10 MG/ML (PF) SYRINGE
PREFILLED_SYRINGE | INTRAVENOUS | Status: AC
  Filled 2022-07-29: qty 10

## 2022-07-29 MED ORDER — FENTANYL CITRATE (PF) 250 MCG/5ML IJ SOLN
INTRAMUSCULAR | Status: AC
  Filled 2022-07-29: qty 5

## 2022-07-29 MED ORDER — MIDAZOLAM HCL 5 MG/5ML IJ SOLN
INTRAMUSCULAR | Status: DC | PRN
  Administered 2022-07-29: 2 mg via INTRAVENOUS

## 2022-07-29 MED ORDER — KETAMINE HCL 10 MG/ML IJ SOLN
INTRAMUSCULAR | Status: DC | PRN
  Administered 2022-07-29: 30 mg via INTRAVENOUS

## 2022-07-29 MED ORDER — LIDOCAINE 2% (20 MG/ML) 5 ML SYRINGE
INTRAMUSCULAR | Status: DC | PRN
  Administered 2022-07-29: 60 mg via INTRAVENOUS

## 2022-07-29 MED ORDER — 0.9 % SODIUM CHLORIDE (POUR BTL) OPTIME
TOPICAL | Status: DC | PRN
  Administered 2022-07-29: 1000 mL

## 2022-07-29 MED ORDER — ACETAMINOPHEN 500 MG PO TABS: 1000.0000 mg | ORAL_TABLET | Freq: Once | ORAL | Status: AC

## 2022-07-29 MED ORDER — VANCOMYCIN HCL IN DEXTROSE 1-5 GM/200ML-% IV SOLN: 1000.0000 mg | INTRAVENOUS | Status: AC

## 2022-07-29 MED ORDER — PROPOFOL 10 MG/ML IV BOLUS
INTRAVENOUS | Status: AC
  Filled 2022-07-29: qty 20

## 2022-07-29 MED ORDER — ORAL CARE MOUTH RINSE: 15.0000 mL | Freq: Once | OROMUCOSAL | Status: AC

## 2022-07-29 MED ORDER — FENTANYL CITRATE (PF) 100 MCG/2ML IJ SOLN: 25.0000 ug | INTRAMUSCULAR | Status: DC | PRN

## 2022-07-29 MED ORDER — DEXAMETHASONE SODIUM PHOSPHATE 10 MG/ML IJ SOLN
INTRAMUSCULAR | Status: DC | PRN
  Administered 2022-07-29: 10 mg via INTRAVENOUS

## 2022-07-29 MED ORDER — OXYCODONE HCL 5 MG PO TABS: 5.0000 mg | ORAL_TABLET | Freq: Once | ORAL | Status: DC | PRN

## 2022-07-29 MED ORDER — BUPIVACAINE-EPINEPHRINE (PF) 0.5% -1:200000 IJ SOLN
INTRAMUSCULAR | Status: DC | PRN
  Administered 2022-07-29: 8 mL

## 2022-07-29 MED ORDER — ACETAMINOPHEN 500 MG PO TABS
ORAL_TABLET | ORAL | Status: AC
  Administered 2022-07-29: 1000 mg via ORAL
  Filled 2022-07-29: qty 2

## 2022-07-29 MED ORDER — CHLORHEXIDINE GLUCONATE 0.12 % MT SOLN: 15.0000 mL | Freq: Once | OROMUCOSAL | Status: AC

## 2022-07-29 MED ORDER — LIDOCAINE-EPINEPHRINE 1 %-1:100000 IJ SOLN
INTRAMUSCULAR | Status: AC
  Filled 2022-07-29: qty 1

## 2022-07-29 MED ORDER — FENTANYL CITRATE (PF) 100 MCG/2ML IJ SOLN
INTRAMUSCULAR | Status: DC | PRN
  Administered 2022-07-29: 50 ug via INTRAVENOUS

## 2022-07-29 MED ORDER — PROPOFOL 10 MG/ML IV BOLUS
INTRAVENOUS | Status: DC | PRN
  Administered 2022-07-29: 120 mg via INTRAVENOUS

## 2022-07-29 MED ORDER — KETAMINE HCL 50 MG/5ML IJ SOSY
PREFILLED_SYRINGE | INTRAMUSCULAR | Status: AC
  Filled 2022-07-29: qty 5

## 2022-07-29 MED ORDER — PHENYLEPHRINE 80 MCG/ML (10ML) SYRINGE FOR IV PUSH (FOR BLOOD PRESSURE SUPPORT)
PREFILLED_SYRINGE | INTRAVENOUS | Status: AC
  Filled 2022-07-29: qty 10

## 2022-07-29 MED ORDER — MIDAZOLAM HCL 2 MG/2ML IJ SOLN
INTRAMUSCULAR | Status: AC
  Filled 2022-07-29: qty 2

## 2022-07-29 MED ORDER — OXYCODONE HCL 5 MG/5ML PO SOLN: 5.0000 mg | Freq: Once | ORAL | Status: DC | PRN

## 2022-07-29 MED ORDER — PROPOFOL 500 MG/50ML IV EMUL
INTRAVENOUS | Status: DC | PRN
  Administered 2022-07-29: 20 ug/kg/min via INTRAVENOUS

## 2022-07-29 MED ORDER — PHENYLEPHRINE 80 MCG/ML (10ML) SYRINGE FOR IV PUSH (FOR BLOOD PRESSURE SUPPORT)
PREFILLED_SYRINGE | INTRAVENOUS | Status: DC | PRN
  Administered 2022-07-29: 160 ug via INTRAVENOUS

## 2022-07-29 MED ORDER — ROCURONIUM BROMIDE 100 MG/10ML IV SOLN
INTRAVENOUS | Status: DC | PRN
  Administered 2022-07-29: 50 mg via INTRAVENOUS

## 2022-07-29 SURGICAL SUPPLY — 59 items
ADH SKN CLS APL DERMABOND .7 (GAUZE/BANDAGES/DRESSINGS) ×1
BAG COUNTER SPONGE SURGICOUNT (BAG) ×1 IMPLANT
BAG SPNG CNTER NS LX DISP (BAG) ×1
BAND INSRT 18 STRL LF DISP RB (MISCELLANEOUS) ×2
BAND RUBBER #18 3X1/16 STRL (MISCELLANEOUS) ×2 IMPLANT
BLADE SURG 11 STRL SS (BLADE) ×1 IMPLANT
BUR MATCHSTICK NEURO 3.0 LAGG (BURR) ×1 IMPLANT
CNTNR URN SCR LID CUP LEK RST (MISCELLANEOUS) IMPLANT
CONT SPEC 4OZ STRL OR WHT (MISCELLANEOUS)
COVER BACK TABLE 60X90IN (DRAPES) ×1 IMPLANT
COVER MAYO STAND STRL (DRAPES) ×1 IMPLANT
DERMABOND ADVANCED .7 DNX12 (GAUZE/BANDAGES/DRESSINGS) IMPLANT
DRAIN JACKSON RD 7FR 3/32 (WOUND CARE) IMPLANT
DRAPE C-ARM 42X72 X-RAY (DRAPES) ×1 IMPLANT
DRAPE LAPAROTOMY 100X72X124 (DRAPES) ×1 IMPLANT
DRAPE MICROSCOPE SLANT 54X150 (MISCELLANEOUS) ×1 IMPLANT
DRAPE SURG 17X23 STRL (DRAPES) ×1 IMPLANT
DRSG OPSITE POSTOP 3X4 (GAUZE/BANDAGES/DRESSINGS) IMPLANT
DRSG OPSITE POSTOP 4X6 (GAUZE/BANDAGES/DRESSINGS) IMPLANT
DURAPREP 26ML APPLICATOR (WOUND CARE) ×1 IMPLANT
ELECT REM PT RETURN 9FT ADLT (ELECTROSURGICAL) ×1
ELECTRODE REM PT RTRN 9FT ADLT (ELECTROSURGICAL) ×1 IMPLANT
GAUZE 4X4 16PLY ~~LOC~~+RFID DBL (SPONGE) IMPLANT
GAUZE SPONGE 4X4 12PLY STRL (GAUZE/BANDAGES/DRESSINGS) ×1 IMPLANT
GLOVE ECLIPSE 8.0 STRL XLNG CF (GLOVE) ×2 IMPLANT
GLOVE SRG 8 PF TXTR STRL LF DI (GLOVE) ×2 IMPLANT
GLOVE SURG ENC MOIS LTX SZ8 (GLOVE) ×1 IMPLANT
GLOVE SURG UNDER POLY LF SZ8 (GLOVE) ×2
GLOVE SURG UNDER POLY LF SZ8.5 (GLOVE) ×1 IMPLANT
GOWN STRL REUS W/ TWL LRG LVL3 (GOWN DISPOSABLE) IMPLANT
GOWN STRL REUS W/ TWL XL LVL3 (GOWN DISPOSABLE) ×2 IMPLANT
GOWN STRL REUS W/TWL 2XL LVL3 (GOWN DISPOSABLE) IMPLANT
GOWN STRL REUS W/TWL LRG LVL3 (GOWN DISPOSABLE)
GOWN STRL REUS W/TWL XL LVL3 (GOWN DISPOSABLE) ×2
KIT BASIN OR (CUSTOM PROCEDURE TRAY) ×1 IMPLANT
KIT TURNOVER KIT B (KITS) ×1 IMPLANT
MARKER SKIN DUAL TIP RULER LAB (MISCELLANEOUS) ×1 IMPLANT
NDL HYPO 25X1 1.5 SAFETY (NEEDLE) ×1 IMPLANT
NDL SPNL 18GX3.5 QUINCKE PK (NEEDLE) ×1 IMPLANT
NEEDLE HYPO 25X1 1.5 SAFETY (NEEDLE) ×1 IMPLANT
NEEDLE SPNL 18GX3.5 QUINCKE PK (NEEDLE) ×1 IMPLANT
NS IRRIG 1000ML POUR BTL (IV SOLUTION) ×1 IMPLANT
PACK LAMINECTOMY NEURO (CUSTOM PROCEDURE TRAY) ×1 IMPLANT
PAD ARMBOARD 7.5X6 YLW CONV (MISCELLANEOUS) ×3 IMPLANT
PATTIES SURGICAL .5 X.5 (GAUZE/BANDAGES/DRESSINGS) IMPLANT
PATTIES SURGICAL .5 X1 (DISPOSABLE) IMPLANT
PATTIES SURGICAL 1X1 (DISPOSABLE) IMPLANT
SPIKE FLUID TRANSFER (MISCELLANEOUS) ×1 IMPLANT
SPONGE SURGIFOAM ABS GEL 12-7 (HEMOSTASIS) ×1 IMPLANT
SPONGE T-LAP 4X18 ~~LOC~~+RFID (SPONGE) IMPLANT
STAPLER VISISTAT 35W (STAPLE) IMPLANT
SUT VIC AB 0 CT1 27 (SUTURE)
SUT VIC AB 0 CT1 27XBRD ANBCTR (SUTURE) IMPLANT
SUT VIC AB 2-0 CP2 18 (SUTURE) ×1 IMPLANT
SUT VIC AB 3-0 SH 8-18 (SUTURE) IMPLANT
TOWEL GREEN STERILE (TOWEL DISPOSABLE) ×1 IMPLANT
TOWEL GREEN STERILE FF (TOWEL DISPOSABLE) ×1 IMPLANT
TRAY FOLEY MTR SLVR 16FR STAT (SET/KITS/TRAYS/PACK) IMPLANT
WATER STERILE IRR 1000ML POUR (IV SOLUTION) ×1 IMPLANT

## 2022-07-29 NOTE — Anesthesia Procedure Notes (Signed)
Procedure Name: Intubation Date/Time: 07/29/2022 3:04 PM  Performed by: Darletta Moll, CRNAPre-anesthesia Checklist: Patient identified, Emergency Drugs available, Suction available and Patient being monitored Patient Re-evaluated:Patient Re-evaluated prior to induction Oxygen Delivery Method: Circle system utilized Preoxygenation: Pre-oxygenation with 100% oxygen Induction Type: IV induction Ventilation: Mask ventilation without difficulty Laryngoscope Size: 2 Grade View: Grade II Tube type: Oral Tube size: 7.0 mm Number of attempts: 1 Airway Equipment and Method: Stylet and Oral airway Placement Confirmation: ETT inserted through vocal cords under direct vision, positive ETCO2 and breath sounds checked- equal and bilateral Secured at: 21 cm Tube secured with: Tape Dental Injury: Teeth and Oropharynx as per pre-operative assessment  Difficulty Due To: Difficult Airway- due to anterior larynx Comments: CRNA DL attempt x1 with Mac 3, no view. Dr. Fransisco Beau attempt with Sabra Heck 2, Grade 2b view successful intubation

## 2022-07-29 NOTE — Transfer of Care (Signed)
Immediate Anesthesia Transfer of Care Note  Patient: Sonya Allen  Procedure(s) Performed: REMOVAL OF SPINAL CORD STIMULATOR AND BATTERY (Back)  Patient Location: PACU  Anesthesia Type:General  Level of Consciousness: drowsy and patient cooperative  Airway & Oxygen Therapy: Patient Spontanous Breathing  Post-op Assessment: Report given to RN, Post -op Vital signs reviewed and stable, and Patient moving all extremities X 4  Post vital signs: Reviewed and stable  Last Vitals:  Vitals Value Taken Time  BP 133/84 07/29/22 1606  Temp 36.6 C 07/29/22 1605  Pulse 70 07/29/22 1610  Resp 15 07/29/22 1610  SpO2 98 % 07/29/22 1610  Vitals shown include unvalidated device data.  Last Pain:  Vitals:   07/29/22 1214  PainSc: 5       Patients Stated Pain Goal: 4 (07/29/22 1214)  Complications: No notable events documented.

## 2022-07-29 NOTE — H&P (Signed)
Providing Compassionate, Quality Care - Together  NEUROSURGERY HISTORY & PHYSICAL   Sonya Allen is an 66 y.o. female.   Chief Complaint: Failure of spinal cord stimulator HPI: This is a 66 year old female with a history of a dorsal column stimulator placed percutaneously.  At this point she is no longer had significant relief with that whatsoever.  One of the leads is thought to have migrated.  Past Medical History:  Diagnosis Date   Anxiety    Arthritis    Bilateral dry eyes    Takes Systane Drops   Bronchitis    hx of   Constipation due to pain medication    Depression    Edema of extremities    Takes Lasix   Fibromyalgia    History of kidney stones    Hypercholesteremia    PONV (postoperative nausea and vomiting)    PONV (postoperative nausea and vomiting) 02/07/2021   Restless leg syndrome    Takes Klonopin    Past Surgical History:  Procedure Laterality Date   ABDOMINAL HYSTERECTOMY     ANKLE FUSION Right    BACK SURGERY     x 6 lumbar & cervical   CARPAL TUNNEL RELEASE Bilateral    KNEE ARTHROSCOPY Bilateral    ROTATOR CUFF REPAIR Right    TOTAL KNEE ARTHROPLASTY Left 07/25/2013   Procedure: TOTAL KNEE ARTHROPLASTY;  Surgeon: Dannielle Huh, MD;  Location: [facility] OR;  Service: Orthopedics;  Laterality: Left;    Family History  Problem Relation Age of Onset   Cancer Mother    Heart Problems Father    Diabetes Sister    Heart Problems Sister    Social History:  reports that she has been smoking cigarettes. She has a 14.50 pack-year smoking history. She has never used smokeless tobacco. She reports that she does not currently use drugs. She reports that she does not drink alcohol.  Allergies:  Allergies  Allergen Reactions   Pravachol [Pravastatin Sodium] Other (See Comments)    Muscles  aches     Augmentin [Amoxicillin-Pot Clavulanate] Diarrhea   Benadryl Allergy [Diphenhydramine Hcl] Other (See Comments)    leg jerking/restless legs   Codeine Nausea  Only    Patient tolerates percocet (oxycodone-acetaminophen) with no issue    Facility-Administered Medications Prior to Admission  Medication Dose Route Frequency Provider Last Rate Last Admin   triamcinolone acetonide (KENALOG) 10 MG/ML injection 10 mg  10 mg Other Once Asencion Islam, DPM       triamcinolone acetonide (KENALOG) 10 MG/ML injection 10 mg  10 mg Other Once Asencion Islam, DPM       triamcinolone acetonide (KENALOG) 10 MG/ML injection 10 mg  10 mg Other Once Asencion Islam, DPM       Medications Prior to Admission  Medication Sig Dispense Refill   atorvastatin (LIPITOR) 10 MG tablet Take 10 mg by mouth every evening.     beta carotene w/minerals (OCUVITE) tablet Take 1 tablet by mouth in the morning.     clonazePAM (KLONOPIN) 1 MG tablet Take 1 mg by mouth at bedtime. Restless legs     diclofenac Sodium (VOLTAREN) 1 % GEL Apply 2 g topically 4 (four) times daily as needed (pain.).     furosemide (LASIX) 20 MG tablet Take 20 mg by mouth in the morning.     ibuprofen (ADVIL) 800 MG tablet Take 1 tablet (800 mg total) by mouth every 8 (eight) hours as needed. (Patient taking differently: Take 800 mg by mouth  in the morning, at noon, and at bedtime.) 30 tablet 0   oxyCODONE-acetaminophen (PERCOCET) 7.5-325 MG tablet Take 1 tablet by mouth 4 (four) times daily as needed (pain.).     pregabalin (LYRICA) 100 MG capsule Take 100 mg by mouth at bedtime.  0   promethazine (PHENERGAN) 25 MG tablet Take 25 mg by mouth every 6 (six) hours as needed for vomiting or nausea.     tiZANidine (ZANAFLEX) 4 MG tablet Take 4 mg by mouth in the morning and at bedtime.     venlafaxine XR (EFFEXOR-XR) 150 MG 24 hr capsule Take 150 mg by mouth daily with lunch.     Vitamin E 450 MG (1000 UT) CAPS Take 1,000 Units by mouth every evening.     ZETIA 10 MG tablet Take 10 mg by mouth in the morning.     Aspirin-Acetaminophen-Caffeine (GOODYS EXTRA STRENGTH) 520-260-32.5 MG PACK Take 1 packet by mouth  daily as needed (severe headache).     urea (CARMOL) 40 % CREA Apply 1 Application topically daily. (Patient not taking: Reported on 07/23/2022) 227 g 1    No results found for this or any previous visit (from the past 48 hour(s)). No results found.  ROS All pertinent positives and negatives are listed in HPI above. Blood pressure 116/75, pulse 63, temperature 98 F (36.7 C), resp. rate 17, height 5\' 8"  (1.727 m), weight 74.8 kg, SpO2 97 %. Physical Exam  Awake alert Orient x3, no acute distress  PERRLA  Follows commands x4  Moves all extremities equally Sensory intact light touch Speech fluent and appropriate  Assessment/Plan 65 year old female with  Failure of spinal cord stimulator  -OR today for removal of dorsal column stimulator and IPG.  We discussed all risks, benefits and expected outcomes as well as alternatives to treatment.  I answered all of her questions.  She would like to proceed with surgical invention.  Informed consent was obtained and witnessed.   Thank you for allowing me to participate in this patient's care.  Please do not hesitate to call with questions or concerns.   70, DO Neurosurgeon [facility] Cell: [phone]

## 2022-07-29 NOTE — Anesthesia Postprocedure Evaluation (Signed)
Anesthesia Post Note  Patient: Sonya Allen  Procedure(s) Performed: REMOVAL OF SPINAL CORD STIMULATOR AND BATTERY (Back)     Patient location during evaluation: PACU Anesthesia Type: General Level of consciousness: awake and alert Pain management: pain level controlled Vital Signs Assessment: post-procedure vital signs reviewed and stable Respiratory status: spontaneous breathing, nonlabored ventilation and respiratory function stable Cardiovascular status: stable and blood pressure returned to baseline Anesthetic complications: no   No notable events documented.  Last Vitals:  Vitals:   07/29/22 1615 07/29/22 1630  BP: (!) 147/73 (!) 145/70  Pulse: 64 62  Resp: 14 14  Temp:  (!) 36.2 C  SpO2: 100% 99%    Last Pain:  Vitals:   07/29/22 1605  PainSc: 0-No pain                 Beryle Lathe

## 2022-07-29 NOTE — Op Note (Signed)
   Providing Compassionate, Quality Care - Together  Date of service: 07/29/2022  PREOP DIAGNOSIS:  Failed spinal cord stimulator  POSTOP DIAGNOSIS: Same  PROCEDURE: Removal of spinal cord stimulator and IPG  SURGEON: Dr. Kendell Bane C. Lincoln Ginley, DO  ASSISTANT: None  ANESTHESIA: General Endotracheal  EBL: Minimal  SPECIMENS: None  DRAINS: None  COMPLICATIONS: None  CONDITION: Hemodynamically stable  HISTORY: Sonya Allen is a 66 y.o. 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  She feels as though it is no longer providing her benefit and therefore she elected to have it removed.  She did have a a single lead that had migrated and therefore her coverage of her pain wants less than adequate.  We discussed all risks benefits and expected outcomes of removal, informed consent was obtained and witnessed.  PROCEDURE IN DETAIL: The patient was brought to the operating room. After induction of general anesthesia, the patient was positioned on the operative table in the prone position. All pressure points were meticulously padded. Skin incision was then marked out and prepped and draped in the usual sterile fashion. Physician driven timeout was performed.  AP fluoroscopy confirmed wire trajectory.  Local anesthetic was injected into the planned incisions.  Using 10 blade, prior incision was opened sharply over the dorsal anchors of the dorsal column stimulator.  These were removed sharply with Bovie electrocautery and the wires were gently pulled from the epidural space without difficulty.  They were noted to be intact.  These were cut sharply.  Using a 10 blade, the IPG pocket was opened sharply.  Bovie electrocautery was used to free this from scar tissue.  It was removed from the pocket without difficulty and the remaining wires were removed in totality.  Hemostasis was achieved with monopolar cautery.  The wounds were then closed with 0  Vicryl, 2-0 Vicryl and 3-0 Vicryl suture.  Skin was closed with skin glue, sterile dressing was applied.  At the end of the case all sponge, needle, and instrument counts were correct. The patient was then transferred to the stretcher, extubated, and taken to the post-anesthesia care unit in stable hemodynamic condition.

## 2022-07-30 ENCOUNTER — Encounter (HOSPITAL_COMMUNITY): Payer: Self-pay | Admitting: Neurological Surgery

## 2022-08-12 ENCOUNTER — Ambulatory Visit: Payer: Medicare Other | Admitting: Podiatry

## 2022-08-20 DIAGNOSIS — L03116 Cellulitis of left lower limb: Secondary | ICD-10-CM | POA: Diagnosis not present

## 2022-09-18 DIAGNOSIS — M47816 Spondylosis without myelopathy or radiculopathy, lumbar region: Secondary | ICD-10-CM | POA: Diagnosis not present

## 2022-09-18 DIAGNOSIS — E785 Hyperlipidemia, unspecified: Secondary | ICD-10-CM | POA: Diagnosis not present

## 2022-09-18 DIAGNOSIS — S81802A Unspecified open wound, left lower leg, initial encounter: Secondary | ICD-10-CM | POA: Diagnosis not present

## 2022-09-18 DIAGNOSIS — Z79899 Other long term (current) drug therapy: Secondary | ICD-10-CM | POA: Diagnosis not present

## 2022-10-07 DIAGNOSIS — M5416 Radiculopathy, lumbar region: Secondary | ICD-10-CM | POA: Diagnosis not present

## 2022-10-14 NOTE — Progress Notes (Signed)
No orders at PAT appointment  COVID Vaccine Completed: yes  Date of COVID positive in last 90 days: no  PCP - Ernestene Kiel, MD Cardiologist - Shirlee More, MD LOV 02/20/21 for abnormal EKG  Chest x-ray - n/a EKG - 07/25/22 Epic Stress Test - n/a ECHO - 03/18/21 Epic Cardiac Cath - n/a Pacemaker/ICD device last checked: n/a Spinal Cord Stimulator: removed in December  Bowel Prep - no  Sleep Study - n/a CPAP -   Fasting Blood Sugar - n/a Checks Blood Sugar _____ times a day  Last dose of GLP1 agonist-  N/A GLP1 instructions:  N/A   Last dose of SGLT-2 inhibitors-  N/A SGLT-2 instructions: N/A   Blood Thinner Instructions: n/a Aspirin Instructions: Last Dose:  Activity level: Can go up a flight of stairs and perform activities of daily living without stopping and without symptoms of chest pain or shortness of breath.   Anesthesia review: abn EKG  Patient denies shortness of breath, fever, cough and chest pain at PAT appointment  Patient verbalized understanding of instructions that were given to them at the PAT appointment. Patient was also instructed that they will need to review over the PAT instructions again at home before surgery.

## 2022-10-14 NOTE — Progress Notes (Signed)
Please place orders for PAT appointment scheduled 10/15/22.

## 2022-10-14 NOTE — Patient Instructions (Signed)
SURGICAL WAITING ROOM VISITATION  Patients having surgery or a procedure may have no more than 2 support people in the waiting area - these visitors may rotate.    Children under the age of 81 must have an adult with them who is not the patient.  Due to an increase in RSV and influenza rates and associated hospitalizations, children ages 87 and under may not visit patients in [place].  If the patient needs to stay at the hospital during part of their recovery, the visitor guidelines for inpatient rooms apply. Pre-op nurse will coordinate an appropriate time for 1 support person to accompany patient in pre-op.  This support person may not rotate.    Please refer to the [facility] website for the visitor guidelines for Inpatients (after your surgery is over and you are in a regular room).    Your procedure is scheduled on: 10/27/22   Report to [facility] Main Entrance    Report to admitting at 5:15 AM   Call this number if you have problems the morning of surgery [phone]   Do not eat food :After Midnight.   After Midnight you may have the following liquids until 4:15 AM DAY OF SURGERY  Water Non-Citrus Juices (without pulp, NO RED-Apple, White grape, White cranberry) Black Coffee (NO MILK/CREAM OR CREAMERS, sugar ok)  Clear Tea (NO MILK/CREAM OR CREAMERS, sugar ok) regular and decaf                             Plain Jell-O (NO RED)                                           Fruit ices (not with fruit pulp, NO RED)                                     Popsicles (NO RED)                                                               Sports drinks like Gatorade (NO RED)          If you have questions, please contact your surgeon's office.   FOLLOW BOWEL PREP AND ANY ADDITIONAL PRE OP INSTRUCTIONS YOU RECEIVED FROM YOUR SURGEON'S OFFICE!!!     Oral Hygiene is also important to reduce your risk of infection.                                    Remember - BRUSH  YOUR TEETH THE MORNING OF SURGERY WITH YOUR REGULAR TOOTHPASTE  DENTURES WILL BE REMOVED PRIOR TO SURGERY PLEASE DO NOT APPLY "Poly grip" OR ADHESIVES!!!   Take these medicines the morning of surgery with A SIP OF WATER: Norco, Promethazine, Venlafaxine, Zetia                               You may not have any  metal on your body including hair pins, jewelry, and body piercing             Do not wear make-up, lotions, powders, perfumes, or deodorant  Do not wear nail polish including gel and S&S, artificial/acrylic nails, or any other type of covering on natural nails including finger and toenails. If you have artificial nails, gel coating, etc. that needs to be removed by a nail salon please have this removed prior to surgery or surgery may need to be canceled/ delayed if the surgeon/ anesthesia feels like they are unable to be safely monitored.   Do not shave  48 hours prior to surgery.    Do not bring valuables to the hospital. [place].   Contacts, glasses, dentures or bridgework may not be worn into surgery.   Bring small overnight bag day of surgery.   DO NOT [place]. PHARMACY WILL DISPENSE MEDICATIONS LISTED ON YOUR MEDICATION LIST TO YOU DURING YOUR ADMISSION [place]!   Special Instructions: Bring a copy of your healthcare power of attorney and living will documents the day of surgery if you haven't scanned them before.              Please read over the following fact sheets you were given: IF [place] [phone] Schneiders   If you received a COVID test during your pre-op visit  it is requested that you wear a mask when out in public, stay away from anyone that may not be feeling well and notify your surgeon if you develop symptoms. If you test positive for Covid or have been in contact with anyone that has tested positive in the last  10 days please notify you surgeon.    [place] - Preparing for Surgery Before surgery, you can play an important role.  Because skin is not sterile, your skin needs to be as free of germs as possible.  You can reduce the number of germs on your skin by washing with CHG (chlorahexidine gluconate) soap before surgery.  CHG is an antiseptic cleaner which kills germs and bonds with the skin to continue killing germs even after washing. Please DO NOT use if you have an allergy to CHG or antibacterial soaps.  If your skin becomes reddened/irritated stop using the CHG and inform your nurse when you arrive at Short Stay. Do not shave (including legs and underarms) for at least 48 hours prior to the first CHG shower.  You may shave your face/neck.  Please follow these instructions carefully:  1.  Shower with CHG Soap the night before surgery and the  morning of surgery.  2.  If you choose to wash your hair, wash your hair first as usual with your normal  shampoo.  3.  After you shampoo, rinse your hair and body thoroughly to remove the shampoo.                             4.  Use CHG as you would any other liquid soap.  You can apply chg directly to the skin and wash.  Gently with a scrungie or clean washcloth.  5.  Apply the CHG Soap to your body ONLY FROM THE NECK DOWN.   Do   not use on  face/ open                           Wound or open sores. Avoid contact with eyes, ears mouth and   genitals (private parts).                       Wash face,  Genitals (private parts) with your normal soap.             6.  Wash thoroughly, paying special attention to the area where your    surgery  will be performed.  7.  Thoroughly rinse your body with warm water from the neck down.  8.  DO NOT shower/wash with your normal soap after using and rinsing off the CHG Soap.                9.  Pat yourself dry with a clean towel.            10.  Wear clean pajamas.            11.  Place clean sheets on your bed the night  of your first shower and do not  sleep with pets. Day of Surgery : Do not apply any lotions/deodorants the morning of surgery.  Please wear clean clothes to the hospital/surgery center.  FAILURE TO FOLLOW THESE INSTRUCTIONS MAY RESULT IN THE CANCELLATION OF YOUR SURGERY  PATIENT SIGNATURE_________________________________  NURSE SIGNATURE__________________________________  ________________________________________________________________________

## 2022-10-14 NOTE — Progress Notes (Signed)
Surgery orders requested via Epic inbox. °

## 2022-10-15 ENCOUNTER — Encounter (HOSPITAL_COMMUNITY): Payer: Self-pay

## 2022-10-15 ENCOUNTER — Encounter (HOSPITAL_COMMUNITY)
Admission: RE | Admit: 2022-10-15 | Discharge: 2022-10-15 | Disposition: A | Discharge status: Home / Self Care | Payer: Medicare Other | Source: Ambulatory Visit | Attending: Orthopedic Surgery | Admitting: Orthopedic Surgery

## 2022-10-15 VITALS — BP 121/74 | HR 70 | Temp 98.4°F | Resp 12 | Ht 68.0 in | Wt 156.0 lb

## 2022-10-15 DIAGNOSIS — Z01818 Encounter for other preprocedural examination: Secondary | ICD-10-CM | POA: Diagnosis not present

## 2022-10-15 LAB — CBC
HCT: 38.3 % (ref 36.0–46.0)
Hemoglobin: 12.4 g/dL (ref 12.0–15.0)
MCH: 32.4 pg (ref 26.0–34.0)
MCHC: 32.4 g/dL (ref 30.0–36.0)
MCV: 100 fL (ref 80.0–100.0)
Platelets: 234 10*3/uL (ref 150–400)
RBC: 3.83 MIL/uL — ABNORMAL LOW (ref 3.87–5.11)
RDW: 12 % (ref 11.5–15.5)
WBC: 10.8 10*3/uL — ABNORMAL HIGH (ref 4.0–10.5)
nRBC: 0 % (ref 0.0–0.2)

## 2022-10-16 LAB — SURGICAL PCR SCREEN
MRSA, PCR: POSITIVE — AB
Staphylococcus aureus: POSITIVE — AB

## 2022-10-16 NOTE — Progress Notes (Signed)
STAPH+ and MRSA+, results routed to Dr. Ronnie Derby

## 2022-10-22 ENCOUNTER — Other Ambulatory Visit: Payer: Self-pay | Admitting: Orthopedic Surgery

## 2022-10-22 DIAGNOSIS — G8929 Other chronic pain: Secondary | ICD-10-CM

## 2022-10-27 ENCOUNTER — Encounter (HOSPITAL_COMMUNITY)
Admission: RE | Disposition: A | Discharge status: Home / Self Care | Payer: Self-pay | Source: Home / Self Care | Attending: Orthopedic Surgery

## 2022-10-27 ENCOUNTER — Other Ambulatory Visit: Payer: Self-pay

## 2022-10-27 ENCOUNTER — Ambulatory Visit (HOSPITAL_COMMUNITY): Payer: Medicare Other | Admitting: Physician Assistant

## 2022-10-27 ENCOUNTER — Encounter (HOSPITAL_COMMUNITY): Payer: Self-pay | Admitting: Orthopedic Surgery

## 2022-10-27 ENCOUNTER — Observation Stay (HOSPITAL_COMMUNITY)
Admission: RE | Admit: 2022-10-27 | Discharge: 2022-10-28 | Disposition: A | Discharge status: Home / Self Care | Payer: Medicare Other | Attending: Orthopedic Surgery | Admitting: Orthopedic Surgery

## 2022-10-27 ENCOUNTER — Ambulatory Visit (HOSPITAL_BASED_OUTPATIENT_CLINIC_OR_DEPARTMENT_OTHER): Payer: Medicare Other | Admitting: Certified Registered Nurse Anesthetist

## 2022-10-27 DIAGNOSIS — M1711 Unilateral primary osteoarthritis, right knee: Principal | ICD-10-CM | POA: Insufficient documentation

## 2022-10-27 DIAGNOSIS — D649 Anemia, unspecified: Secondary | ICD-10-CM | POA: Diagnosis not present

## 2022-10-27 DIAGNOSIS — M797 Fibromyalgia: Secondary | ICD-10-CM | POA: Diagnosis not present

## 2022-10-27 DIAGNOSIS — F418 Other specified anxiety disorders: Secondary | ICD-10-CM | POA: Diagnosis not present

## 2022-10-27 DIAGNOSIS — Z96652 Presence of left artificial knee joint: Secondary | ICD-10-CM | POA: Diagnosis not present

## 2022-10-27 DIAGNOSIS — G8929 Other chronic pain: Secondary | ICD-10-CM

## 2022-10-27 DIAGNOSIS — F17210 Nicotine dependence, cigarettes, uncomplicated: Secondary | ICD-10-CM | POA: Insufficient documentation

## 2022-10-27 DIAGNOSIS — G8918 Other acute postprocedural pain: Secondary | ICD-10-CM | POA: Diagnosis not present

## 2022-10-27 DIAGNOSIS — Z96659 Presence of unspecified artificial knee joint: Secondary | ICD-10-CM

## 2022-10-27 DIAGNOSIS — F17200 Nicotine dependence, unspecified, uncomplicated: Secondary | ICD-10-CM | POA: Diagnosis not present

## 2022-10-27 HISTORY — DX: Presence of unspecified artificial knee joint: Z96.659

## 2022-10-27 HISTORY — PX: TOTAL KNEE ARTHROPLASTY: SHX125

## 2022-10-27 LAB — CBC WITH DIFFERENTIAL/PLATELET
Abs Immature Granulocytes: 0.02 10*3/uL (ref 0.00–0.07)
Basophils Absolute: 0 10*3/uL (ref 0.0–0.1)
Basophils Relative: 1 %
Eosinophils Absolute: 0.2 10*3/uL (ref 0.0–0.5)
Eosinophils Relative: 2 %
HCT: 36.6 % (ref 36.0–46.0)
Hemoglobin: 11.9 g/dL — ABNORMAL LOW (ref 12.0–15.0)
Immature Granulocytes: 0 %
Lymphocytes Relative: 33 %
Lymphs Abs: 2.8 10*3/uL (ref 0.7–4.0)
MCH: 32.8 pg (ref 26.0–34.0)
MCHC: 32.5 g/dL (ref 30.0–36.0)
MCV: 100.8 fL — ABNORMAL HIGH (ref 80.0–100.0)
Monocytes Absolute: 0.6 10*3/uL (ref 0.1–1.0)
Monocytes Relative: 7 %
Neutro Abs: 4.8 10*3/uL (ref 1.7–7.7)
Neutrophils Relative %: 57 %
Platelets: 185 10*3/uL (ref 150–400)
RBC: 3.63 MIL/uL — ABNORMAL LOW (ref 3.87–5.11)
RDW: 12.2 % (ref 11.5–15.5)
WBC: 8.4 10*3/uL (ref 4.0–10.5)
nRBC: 0 % (ref 0.0–0.2)

## 2022-10-27 LAB — COMPREHENSIVE METABOLIC PANEL
ALT: 20 U/L (ref 0–44)
AST: 19 U/L (ref 15–41)
Albumin: 3.6 g/dL (ref 3.5–5.0)
Alkaline Phosphatase: 44 U/L (ref 38–126)
Anion gap: 9 (ref 5–15)
BUN: 20 mg/dL (ref 8–23)
CO2: 26 mmol/L (ref 22–32)
Calcium: 8.7 mg/dL — ABNORMAL LOW (ref 8.9–10.3)
Chloride: 106 mmol/L (ref 98–111)
Creatinine, Ser: 0.59 mg/dL (ref 0.44–1.00)
GFR, Estimated: >60 mL/min (ref >60)
Glucose, Bld: 100 mg/dL — ABNORMAL HIGH (ref 70–99)
Potassium: 3.6 mmol/L (ref 3.5–5.1)
Sodium: 141 mmol/L (ref 135–145)
Total Bilirubin: 0.4 mg/dL (ref 0.3–1.2)
Total Protein: 6.2 g/dL — ABNORMAL LOW (ref 6.5–8.1)

## 2022-10-27 SURGERY — ARTHROPLASTY, KNEE, TOTAL
Anesthesia: Regional | Site: Knee | Laterality: Right

## 2022-10-27 MED ORDER — OXYCODONE HCL 5 MG PO TABS: 10.0000 mg | ORAL_TABLET | Freq: Once | ORAL | Status: DC | PRN

## 2022-10-27 MED ORDER — HYDROMORPHONE HCL 2 MG/ML IJ SOLN
INTRAMUSCULAR | Status: AC
  Filled 2022-10-27: qty 1

## 2022-10-27 MED ORDER — DIPHENHYDRAMINE HCL 12.5 MG/5ML PO ELIX: 12.5000 mg | ORAL_SOLUTION | ORAL | Status: DC | PRN

## 2022-10-27 MED ORDER — FUROSEMIDE 20 MG PO TABS
20.0000 mg | ORAL_TABLET | Freq: Every day | ORAL | Status: DC
  Administered 2022-10-28: 20 mg via ORAL
  Filled 2022-10-27: qty 1

## 2022-10-27 MED ORDER — ALUM & MAG HYDROXIDE-SIMETH 200-200-20 MG/5ML PO SUSP: 30.0000 mL | ORAL | Status: DC | PRN

## 2022-10-27 MED ORDER — CLONAZEPAM 1 MG PO TABS
1.0000 mg | ORAL_TABLET | Freq: Every day | ORAL | Status: DC
  Administered 2022-10-27: 1 mg via ORAL
  Filled 2022-10-27: qty 1

## 2022-10-27 MED ORDER — ONDANSETRON HCL 4 MG/2ML IJ SOLN
INTRAMUSCULAR | Status: DC | PRN
  Administered 2022-10-27: 4 mg via INTRAVENOUS

## 2022-10-27 MED ORDER — FENTANYL CITRATE (PF) 100 MCG/2ML IJ SOLN
INTRAMUSCULAR | Status: AC
  Filled 2022-10-27: qty 2

## 2022-10-27 MED ORDER — ASPIRIN 81 MG PO CHEW
81.0000 mg | CHEWABLE_TABLET | Freq: Two times a day (BID) | ORAL | Status: DC
  Administered 2022-10-28: 81 mg via ORAL
  Filled 2022-10-27: qty 1

## 2022-10-27 MED ORDER — BUPIVACAINE LIPOSOME 1.3 % IJ SUSP
INTRAMUSCULAR | Status: AC
  Filled 2022-10-27: qty 20

## 2022-10-27 MED ORDER — TRANEXAMIC ACID-NACL 1000-0.7 MG/100ML-% IV SOLN
1000.0000 mg | INTRAVENOUS | Status: AC
  Administered 2022-10-27: 1000 mg via INTRAVENOUS
  Filled 2022-10-27: qty 100

## 2022-10-27 MED ORDER — MIDAZOLAM HCL 2 MG/2ML IJ SOLN
INTRAMUSCULAR | Status: AC
  Filled 2022-10-27: qty 2

## 2022-10-27 MED ORDER — EPHEDRINE SULFATE-NACL 50-0.9 MG/10ML-% IV SOSY
PREFILLED_SYRINGE | INTRAVENOUS | Status: DC | PRN
  Administered 2022-10-27: 10 mg via INTRAVENOUS

## 2022-10-27 MED ORDER — METOCLOPRAMIDE HCL 5 MG/ML IJ SOLN: 5.0000 mg | Freq: Three times a day (TID) | INTRAMUSCULAR | Status: DC | PRN

## 2022-10-27 MED ORDER — SODIUM CHLORIDE (PF) 0.9 % IJ SOLN
INTRAMUSCULAR | Status: AC
  Filled 2022-10-27: qty 20

## 2022-10-27 MED ORDER — ACETAMINOPHEN 500 MG PO TABS: 1000.0000 mg | ORAL_TABLET | Freq: Once | ORAL | Status: DC

## 2022-10-27 MED ORDER — ORAL CARE MOUTH RINSE: 15.0000 mL | Freq: Once | OROMUCOSAL | Status: AC

## 2022-10-27 MED ORDER — WATER FOR IRRIGATION, STERILE IR SOLN
Status: DC | PRN
  Administered 2022-10-27: 2000 mL

## 2022-10-27 MED ORDER — HYDROMORPHONE HCL 1 MG/ML IJ SOLN
INTRAMUSCULAR | Status: DC | PRN
  Administered 2022-10-27 (×3): .5 mg via INTRAVENOUS

## 2022-10-27 MED ORDER — CEFAZOLIN SODIUM-DEXTROSE 2-4 GM/100ML-% IV SOLN
2.0000 g | INTRAVENOUS | Status: AC
  Administered 2022-10-27: 2 g via INTRAVENOUS
  Filled 2022-10-27: qty 100

## 2022-10-27 MED ORDER — FERROUS SULFATE 325 (65 FE) MG PO TABS
325.0000 mg | ORAL_TABLET | Freq: Three times a day (TID) | ORAL | Status: DC
  Administered 2022-10-27 – 2022-10-28 (×3): 325 mg via ORAL
  Filled 2022-10-27 (×3): qty 1

## 2022-10-27 MED ORDER — VENLAFAXINE HCL ER 75 MG PO CP24
75.0000 mg | ORAL_CAPSULE | Freq: Every day | ORAL | Status: DC
  Administered 2022-10-28: 75 mg via ORAL
  Filled 2022-10-27: qty 1

## 2022-10-27 MED ORDER — GABAPENTIN 300 MG PO CAPS
300.0000 mg | ORAL_CAPSULE | Freq: Once | ORAL | Status: AC
  Administered 2022-10-27: 300 mg via ORAL
  Filled 2022-10-27: qty 1

## 2022-10-27 MED ORDER — SODIUM CHLORIDE 0.9 % IR SOLN
Status: DC | PRN
  Administered 2022-10-27: 1000 mL

## 2022-10-27 MED ORDER — PHENYLEPHRINE HCL-NACL 20-0.9 MG/250ML-% IV SOLN
INTRAVENOUS | Status: AC
  Filled 2022-10-27: qty 500

## 2022-10-27 MED ORDER — MIDAZOLAM HCL 5 MG/5ML IJ SOLN
INTRAMUSCULAR | Status: DC | PRN
  Administered 2022-10-27: 2 mg via INTRAVENOUS

## 2022-10-27 MED ORDER — PREGABALIN 100 MG PO CAPS
100.0000 mg | ORAL_CAPSULE | Freq: Every day | ORAL | Status: DC
  Administered 2022-10-27: 100 mg via ORAL
  Filled 2022-10-27: qty 1

## 2022-10-27 MED ORDER — ONDANSETRON HCL 4 MG PO TABS: 4.0000 mg | ORAL_TABLET | Freq: Four times a day (QID) | ORAL | Status: DC | PRN

## 2022-10-27 MED ORDER — BISACODYL 5 MG PO TBEC: 5.0000 mg | DELAYED_RELEASE_TABLET | Freq: Every day | ORAL | Status: DC | PRN

## 2022-10-27 MED ORDER — VENLAFAXINE HCL ER 150 MG PO CP24
150.0000 mg | ORAL_CAPSULE | Freq: Every day | ORAL | Status: DC
  Administered 2022-10-28: 150 mg via ORAL
  Filled 2022-10-27: qty 1

## 2022-10-27 MED ORDER — KETOROLAC TROMETHAMINE 30 MG/ML IJ SOLN
INTRAMUSCULAR | Status: DC | PRN
  Administered 2022-10-27: 15 mg via INTRAVENOUS

## 2022-10-27 MED ORDER — SODIUM CHLORIDE (PF) 0.9 % IJ SOLN
INTRAMUSCULAR | Status: DC | PRN
  Administered 2022-10-27: 20 mL

## 2022-10-27 MED ORDER — ONDANSETRON HCL 4 MG/2ML IJ SOLN: 4.0000 mg | Freq: Four times a day (QID) | INTRAMUSCULAR | Status: DC | PRN

## 2022-10-27 MED ORDER — LACTATED RINGERS IV SOLN: INTRAVENOUS | Status: DC

## 2022-10-27 MED ORDER — SENNOSIDES-DOCUSATE SODIUM 8.6-50 MG PO TABS: 1.0000 | ORAL_TABLET | Freq: Every evening | ORAL | Status: DC | PRN

## 2022-10-27 MED ORDER — PROPOFOL 10 MG/ML IV BOLUS
INTRAVENOUS | Status: DC | PRN
  Administered 2022-10-27: 150 mg via INTRAVENOUS

## 2022-10-27 MED ORDER — BUPIVACAINE LIPOSOME 1.3 % IJ SUSP
INTRAMUSCULAR | Status: DC | PRN
  Administered 2022-10-27: 20 mL

## 2022-10-27 MED ORDER — DEXAMETHASONE SODIUM PHOSPHATE 10 MG/ML IJ SOLN
8.0000 mg | Freq: Once | INTRAMUSCULAR | Status: AC
  Administered 2022-10-27: 8 mg via INTRAVENOUS

## 2022-10-27 MED ORDER — METHOCARBAMOL 1000 MG/10ML IJ SOLN: 500.0000 mg | Freq: Four times a day (QID) | INTRAVENOUS | Status: DC | PRN

## 2022-10-27 MED ORDER — EZETIMIBE 10 MG PO TABS
10.0000 mg | ORAL_TABLET | Freq: Every day | ORAL | Status: DC
  Administered 2022-10-28: 10 mg via ORAL
  Filled 2022-10-27: qty 1

## 2022-10-27 MED ORDER — ONDANSETRON HCL 4 MG/2ML IJ SOLN: 4.0000 mg | Freq: Once | INTRAMUSCULAR | Status: DC | PRN

## 2022-10-27 MED ORDER — LIDOCAINE HCL (PF) 2 % IJ SOLN
INTRAMUSCULAR | Status: AC
  Filled 2022-10-27: qty 5

## 2022-10-27 MED ORDER — DEXAMETHASONE SODIUM PHOSPHATE 10 MG/ML IJ SOLN
10.0000 mg | Freq: Once | INTRAMUSCULAR | Status: AC
  Administered 2022-10-28: 10 mg via INTRAVENOUS
  Filled 2022-10-27: qty 1

## 2022-10-27 MED ORDER — 0.9 % SODIUM CHLORIDE (POUR BTL) OPTIME
TOPICAL | Status: DC | PRN
  Administered 2022-10-27: 1000 mL

## 2022-10-27 MED ORDER — CHLORHEXIDINE GLUCONATE 0.12 % MT SOLN
15.0000 mL | Freq: Once | OROMUCOSAL | Status: AC
  Administered 2022-10-27: 15 mL via OROMUCOSAL

## 2022-10-27 MED ORDER — SODIUM CHLORIDE 0.9 % IV SOLN: INTRAVENOUS | Status: DC

## 2022-10-27 MED ORDER — ONDANSETRON HCL 4 MG/2ML IJ SOLN
INTRAMUSCULAR | Status: AC
  Filled 2022-10-27: qty 2

## 2022-10-27 MED ORDER — PHENYLEPHRINE 80 MCG/ML (10ML) SYRINGE FOR IV PUSH (FOR BLOOD PRESSURE SUPPORT)
PREFILLED_SYRINGE | INTRAVENOUS | Status: DC | PRN
  Administered 2022-10-27 (×2): 80 ug via INTRAVENOUS

## 2022-10-27 MED ORDER — FENTANYL CITRATE (PF) 100 MCG/2ML IJ SOLN
INTRAMUSCULAR | Status: DC | PRN
  Administered 2022-10-27 (×2): 50 ug via INTRAVENOUS

## 2022-10-27 MED ORDER — ROPIVACAINE HCL 5 MG/ML IJ SOLN
INTRAMUSCULAR | Status: DC | PRN
  Administered 2022-10-27: 30 mL via PERINEURAL

## 2022-10-27 MED ORDER — BUPIVACAINE LIPOSOME 1.3 % IJ SUSP: 20.0000 mL | Freq: Once | INTRAMUSCULAR | Status: DC

## 2022-10-27 MED ORDER — PROPOFOL 1000 MG/100ML IV EMUL
INTRAVENOUS | Status: AC
  Filled 2022-10-27: qty 100

## 2022-10-27 MED ORDER — DEXAMETHASONE SODIUM PHOSPHATE 10 MG/ML IJ SOLN
INTRAMUSCULAR | Status: DC | PRN
  Administered 2022-10-27: 10 mg

## 2022-10-27 MED ORDER — PHENOL 1.4 % MT LIQD: 1.0000 | OROMUCOSAL | Status: DC | PRN

## 2022-10-27 MED ORDER — BUPIVACAINE-EPINEPHRINE 0.25% -1:200000 IJ SOLN
INTRAMUSCULAR | Status: DC | PRN
  Administered 2022-10-27: 30 mL

## 2022-10-27 MED ORDER — OXYCODONE HCL 5 MG PO TABS
10.0000 mg | ORAL_TABLET | ORAL | Status: DC | PRN
  Administered 2022-10-27 – 2022-10-28 (×4): 10 mg via ORAL
  Filled 2022-10-27 (×4): qty 2

## 2022-10-27 MED ORDER — MENTHOL 3 MG MT LOZG: 1.0000 | LOZENGE | OROMUCOSAL | Status: DC | PRN

## 2022-10-27 MED ORDER — FLEET ENEMA 7-19 GM/118ML RE ENEM: 1.0000 | ENEMA | Freq: Once | RECTAL | Status: DC | PRN

## 2022-10-27 MED ORDER — DEXAMETHASONE SODIUM PHOSPHATE 10 MG/ML IJ SOLN
INTRAMUSCULAR | Status: AC
  Filled 2022-10-27: qty 1

## 2022-10-27 MED ORDER — KETAMINE HCL 10 MG/ML IJ SOLN
INTRAMUSCULAR | Status: DC | PRN
  Administered 2022-10-27: 5 mg via INTRAVENOUS
  Administered 2022-10-27: 10 mg via INTRAVENOUS
  Administered 2022-10-27: 20 mg via INTRAVENOUS

## 2022-10-27 MED ORDER — KETAMINE HCL 10 MG/ML IJ SOLN
INTRAMUSCULAR | Status: AC
  Filled 2022-10-27: qty 1

## 2022-10-27 MED ORDER — ACETAMINOPHEN 500 MG PO TABS
1000.0000 mg | ORAL_TABLET | Freq: Once | ORAL | Status: AC
  Administered 2022-10-27: 1000 mg via ORAL
  Filled 2022-10-27: qty 2

## 2022-10-27 MED ORDER — METHOCARBAMOL 500 MG PO TABS
500.0000 mg | ORAL_TABLET | Freq: Four times a day (QID) | ORAL | Status: DC | PRN
  Administered 2022-10-27 – 2022-10-28 (×2): 500 mg via ORAL
  Filled 2022-10-27 (×2): qty 1

## 2022-10-27 MED ORDER — ATORVASTATIN CALCIUM 10 MG PO TABS
10.0000 mg | ORAL_TABLET | Freq: Every evening | ORAL | Status: DC
  Administered 2022-10-27: 10 mg via ORAL
  Filled 2022-10-27: qty 1

## 2022-10-27 MED ORDER — HYDROMORPHONE HCL 1 MG/ML IJ SOLN: 0.2500 mg | INTRAMUSCULAR | Status: DC | PRN

## 2022-10-27 MED ORDER — BUPIVACAINE-EPINEPHRINE (PF) 0.25% -1:200000 IJ SOLN
INTRAMUSCULAR | Status: AC
  Filled 2022-10-27: qty 30

## 2022-10-27 MED ORDER — LIDOCAINE HCL (PF) 2 % IJ SOLN
INTRAMUSCULAR | Status: DC | PRN
  Administered 2022-10-27: 60 mg via INTRADERMAL

## 2022-10-27 MED ORDER — ACETAMINOPHEN 500 MG PO TABS
1000.0000 mg | ORAL_TABLET | Freq: Four times a day (QID) | ORAL | Status: AC
  Administered 2022-10-27 – 2022-10-28 (×3): 1000 mg via ORAL
  Filled 2022-10-27 (×3): qty 2

## 2022-10-27 MED ORDER — PROPOFOL 500 MG/50ML IV EMUL
INTRAVENOUS | Status: DC | PRN
  Administered 2022-10-27: 25 ug/kg/min via INTRAVENOUS

## 2022-10-27 MED ORDER — POVIDONE-IODINE 10 % EX SWAB: 2.0000 | Freq: Once | CUTANEOUS | Status: DC

## 2022-10-27 MED ORDER — METOCLOPRAMIDE HCL 5 MG PO TABS: 5.0000 mg | ORAL_TABLET | Freq: Three times a day (TID) | ORAL | Status: DC | PRN

## 2022-10-27 MED ORDER — PANTOPRAZOLE SODIUM 40 MG PO TBEC
40.0000 mg | DELAYED_RELEASE_TABLET | Freq: Every day | ORAL | Status: DC
  Administered 2022-10-28: 40 mg via ORAL
  Filled 2022-10-27: qty 1

## 2022-10-27 MED ORDER — DOCUSATE SODIUM 100 MG PO CAPS
100.0000 mg | ORAL_CAPSULE | Freq: Two times a day (BID) | ORAL | Status: DC
  Administered 2022-10-27 – 2022-10-28 (×2): 100 mg via ORAL
  Filled 2022-10-27 (×2): qty 1

## 2022-10-27 MED ORDER — HYDROMORPHONE HCL 1 MG/ML IJ SOLN
0.5000 mg | INTRAMUSCULAR | Status: DC | PRN
  Administered 2022-10-27: 0.5 mg via INTRAVENOUS
  Administered 2022-10-28: 1 mg via INTRAVENOUS
  Filled 2022-10-27 (×2): qty 1

## 2022-10-27 MED ORDER — ZOLPIDEM TARTRATE 5 MG PO TABS: 5.0000 mg | ORAL_TABLET | Freq: Every evening | ORAL | Status: DC | PRN

## 2022-10-27 MED ORDER — AMISULPRIDE (ANTIEMETIC) 5 MG/2ML IV SOLN: 10.0000 mg | Freq: Once | INTRAVENOUS | Status: DC | PRN

## 2022-10-27 MED ORDER — HYDROMORPHONE HCL 1 MG/ML IJ SOLN
INTRAMUSCULAR | Status: AC
  Administered 2022-10-27: 0.5 mg via INTRAVENOUS
  Filled 2022-10-27: qty 2

## 2022-10-27 SURGICAL SUPPLY — 59 items
ARTISURF 10M PLY R 6-9CD KNEE (Knees) IMPLANT
BAG COUNTER SPONGE SURGICOUNT (BAG) IMPLANT
BAG SPEC THK2 15X12 ZIP CLS (MISCELLANEOUS) ×1
BAG SPNG CNTER NS LX DISP (BAG)
BAG ZIPLOCK 12X15 (MISCELLANEOUS) ×1 IMPLANT
BLADE SAGITTAL 13X1.27X60 (BLADE) ×1 IMPLANT
BLADE SAW SGTL 18X1.27X75 (BLADE) ×1 IMPLANT
BNDG CMPR 5X62 HK CLSR LF (GAUZE/BANDAGES/DRESSINGS) ×1
BNDG ELASTIC 6INX 5YD STR LF (GAUZE/BANDAGES/DRESSINGS) ×1 IMPLANT
BNDG ELASTIC 6X5.8 VLCR STR LF (GAUZE/BANDAGES/DRESSINGS) IMPLANT
BOWL SMART MIX CTS (DISPOSABLE) ×1 IMPLANT
BSPLAT TIB 5D D CMNT STM RT (Knees) ×1 IMPLANT
CEMENT BONE R 1X40 (Cement) ×2 IMPLANT
CLSR STERI-STRIP ANTIMIC 1/2X4 (GAUZE/BANDAGES/DRESSINGS) IMPLANT
COVER SURGICAL LIGHT HANDLE (MISCELLANEOUS) ×1 IMPLANT
CUFF TOURN SGL QUICK 34 (TOURNIQUET CUFF) ×1
CUFF TRNQT CYL 34X4.125X (TOURNIQUET CUFF) ×1 IMPLANT
DRAPE INCISE IOBAN 66X45 STRL (DRAPES) ×2 IMPLANT
DRAPE U-SHAPE 47X51 STRL (DRAPES) ×1 IMPLANT
DRESSING AQUACEL AG SP 3.5X10 (GAUZE/BANDAGES/DRESSINGS) IMPLANT
DRSG AQUACEL AG ADV 3.5X10 (GAUZE/BANDAGES/DRESSINGS) ×1 IMPLANT
DRSG AQUACEL AG SP 3.5X10 (GAUZE/BANDAGES/DRESSINGS) ×1
DURAPREP 26ML APPLICATOR (WOUND CARE) ×2 IMPLANT
ELECT REM PT RETURN 15FT ADLT (MISCELLANEOUS) ×1 IMPLANT
FEMUR  CMT CCR STD SZ6 R KNEE (Knees) ×1 IMPLANT
FEMUR CMT CCR STD SZ6 R KNEE (Knees) ×1 IMPLANT
FEMUR CMTD CCR STD SZ6 R KNEE (Knees) IMPLANT
GLOVE BIOGEL M 7.0 STRL (GLOVE) IMPLANT
GLOVE BIOGEL PI IND STRL 7.5 (GLOVE) IMPLANT
GLOVE BIOGEL PI IND STRL 8.5 (GLOVE) ×1 IMPLANT
GLOVE SURG ORTHO 8.0 STRL STRW (GLOVE) ×2 IMPLANT
GOWN STRL REUS W/ TWL XL LVL3 (GOWN DISPOSABLE) ×2 IMPLANT
GOWN STRL REUS W/TWL XL LVL3 (GOWN DISPOSABLE) ×2
HANDPIECE INTERPULSE COAX TIP (DISPOSABLE) ×1
HOLDER FOLEY CATH W/STRAP (MISCELLANEOUS) ×1 IMPLANT
HOOD PEEL AWAY T7 (MISCELLANEOUS) ×3 IMPLANT
KIT TURNOVER KIT A (KITS) IMPLANT
MANIFOLD NEPTUNE II (INSTRUMENTS) ×1 IMPLANT
NS IRRIG 1000ML POUR BTL (IV SOLUTION) ×1 IMPLANT
PACK TOTAL KNEE CUSTOM (KITS) ×1 IMPLANT
PROTECTOR NERVE ULNAR (MISCELLANEOUS) ×1 IMPLANT
SET HNDPC FAN SPRY TIP SCT (DISPOSABLE) ×1 IMPLANT
SPIKE FLUID TRANSFER (MISCELLANEOUS) ×2 IMPLANT
STEM POLY PAT PLY 35M KNEE (Knees) IMPLANT
STEM TIBIA 5 DEG SZ D R KNEE (Knees) IMPLANT
STRIP CLOSURE SKIN 1/2X4 (GAUZE/BANDAGES/DRESSINGS) ×1 IMPLANT
SUT BONE WAX W31G (SUTURE) ×1 IMPLANT
SUT MNCRL AB 3-0 PS2 18 (SUTURE) ×1 IMPLANT
SUT STRATAFIX 0 PDS 27 VIOLET (SUTURE) ×1
SUT STRATAFIX 1PDS 45CM VIOLET (SUTURE) ×1 IMPLANT
SUT VIC AB 1 CT1 27 (SUTURE) ×1
SUT VIC AB 1 CT1 27XBRD ANBCTR (SUTURE) IMPLANT
SUT VIC AB 1 CT1 36 (SUTURE) ×1 IMPLANT
SUTURE STRATFX 0 PDS 27 VIOLET (SUTURE) ×1 IMPLANT
TIBIA STEM 5 DEG SZ D R KNEE (Knees) ×1 IMPLANT
TRAY FOLEY MTR SLVR 16FR STAT (SET/KITS/TRAYS/PACK) ×1 IMPLANT
TUBE SUCTION HIGH CAP CLEAR NV (SUCTIONS) IMPLANT
WATER STERILE IRR 1000ML POUR (IV SOLUTION) ×2 IMPLANT
WRAP KNEE MAXI GEL POST OP (GAUZE/BANDAGES/DRESSINGS) ×1 IMPLANT

## 2022-10-27 NOTE — Anesthesia Preprocedure Evaluation (Addendum)
Anesthesia Evaluation  Patient identified by MRN, date of birth, ID band Patient awake    Reviewed: Allergy & Precautions, H&P , NPO status , Patient's Chart, lab work & pertinent test results  History of Anesthesia Complications (+) PONV and history of anesthetic complications  Airway Mallampati: II  TM Distance: >3 FB Neck ROM: Full    Dental no notable dental hx.    Pulmonary Current Smoker Snores at night, has never had sleep study <1/2ppd No inhalers   Pulmonary exam normal breath sounds clear to auscultation       Cardiovascular negative cardio ROS Normal cardiovascular exam Rhythm:Regular Rate:Normal     Neuro/Psych  PSYCHIATRIC DISORDERS Anxiety Depression    negative neurological ROS     GI/Hepatic negative GI ROS, Neg liver ROS,,,  Endo/Other  negative endocrine ROS    Renal/GU negative Renal ROS  negative genitourinary   Musculoskeletal  (+) Arthritis , Osteoarthritis,  Fibromyalgia -, narcotic dependentTakes norco '10mg'$  QID  Has had 4 back surgeries, including spinal cord placement and removal    Abdominal   Peds negative pediatric ROS (+)  Hematology  (+) Blood dyscrasia, anemia Hb 11.9, plt 185   Anesthesia Other Findings   Reproductive/Obstetrics negative OB ROS                             Anesthesia Physical Anesthesia Plan  ASA: 3  Anesthesia Plan: General and Regional   Post-op Pain Management: Tylenol PO (pre-op)*, Toradol IV (intra-op)* and Regional block*   Induction: Intravenous  PONV Risk Score and Plan: Ondansetron, Dexamethasone, Propofol infusion, TIVA, Midazolam and Treatment may vary due to age or medical condition  Airway Management Planned: LMA  Additional Equipment: None  Intra-op Plan:   Post-operative Plan: Extubation in OR  Informed Consent: I have reviewed the patients History and Physical, chart, labs and discussed the procedure  including the risks, benefits and alternatives for the proposed anesthesia with the patient or authorized representative who has indicated his/her understanding and acceptance.     Dental advisory given  Plan Discussed with: CRNA  Anesthesia Plan Comments: (D/w patient, opts for general anesthesia )        Anesthesia Quick Evaluation

## 2022-10-27 NOTE — H&P (Signed)
Sonya Allen MRN:  [id number] DOB/SEX:  April 15, 1956/female  CHIEF COMPLAINT:  Painful right Knee  HISTORY: Patient is a 67 y.o. female presented with a history of pain in the right knee. Onset of symptoms was gradual starting a few years ago with gradually worsening course since that time. Patient has been treated conservatively with over-the-counter NSAIDs and activity modification. Patient currently rates pain in the knee at 10 out of 10 with activity. There is pain at night.  PAST MEDICAL HISTORY: Patient Active Problem List   Diagnosis Date Noted   Arthritis 02/07/2021   Bilateral dry eyes 02/07/2021   Bronchitis 02/07/2021   Constipation due to pain medication 02/07/2021   Edema of extremities 02/07/2021   Fibromyalgia 02/07/2021   History of kidney stones 02/07/2021   Hypercholesteremia 02/07/2021   PONV (postoperative nausea and vomiting) 02/07/2021   Restless leg syndrome 02/07/2021   Arthritis of carpometacarpal (CMC) joint of left thumb 06/21/2020   Left carpal tunnel syndrome 06/21/2020   Recurrent right knee instability 12/31/2017   Acute pain of left shoulder 08/13/2017   Prolapse of female pelvic organs 05/19/2017   Knee pain, right 01/23/2014   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   Unilateral primary osteoarthritis, right knee 05/26/2013   Other specified postprocedural states 01/31/2013   Past Medical History:  Diagnosis Date   Anxiety    Arthritis    Bilateral dry eyes    Takes Systane Drops   Bronchitis    hx of   Constipation due to pain medication    Depression    Edema of extremities    Takes Lasix   Fibromyalgia    History of kidney stones    Hypercholesteremia    PONV (postoperative nausea and vomiting)    PONV (postoperative nausea and vomiting) 02/07/2021   Restless leg syndrome    Takes Klonopin   Past Surgical History:  Procedure Laterality Date   ABDOMINAL HYSTERECTOMY     ANKLE FUSION Right    BACK SURGERY     x 6 lumbar &  cervical   CARPAL TUNNEL RELEASE Bilateral    KNEE ARTHROSCOPY Bilateral    ROTATOR CUFF REPAIR Right    SPINAL CORD STIMULATOR REMOVAL N/A 07/29/2022   Procedure: REMOVAL OF SPINAL CORD STIMULATOR AND BATTERY;  Surgeon: Karsten Ro, DO;  Location: [facility];  Service: Neurosurgery;  Laterality: N/A;  3C   TOTAL KNEE ARTHROPLASTY Left 07/25/2013   Procedure: TOTAL KNEE ARTHROPLASTY;  Surgeon: Vickey Huger, MD;  Location: [facility];  Service: Orthopedics;  Laterality: Left;     MEDICATIONS:   Facility-Administered Medications Prior to Admission  Medication Dose Route Frequency Provider Last Rate Last Admin   triamcinolone acetonide (KENALOG) 10 MG/ML injection 10 mg  10 mg Other Once Landis Martins, DPM       triamcinolone acetonide (KENALOG) 10 MG/ML injection 10 mg  10 mg Other Once Landis Martins, DPM       triamcinolone acetonide (KENALOG) 10 MG/ML injection 10 mg  10 mg Other Once Landis Martins, DPM       Medications Prior to Admission  Medication Sig Dispense Refill Last Dose   Ascorbic Acid (VITAMIN C PO) Take 1 tablet by mouth daily.   10/23/2022   Aspirin-Acetaminophen-Caffeine (GOODYS EXTRA STRENGTH) 520-260-32.5 MG PACK Take 1 packet by mouth daily as needed (severe headache). 56 each 0 Past Month   atorvastatin (LIPITOR) 10 MG tablet Take 10 mg by mouth every evening.   10/26/2022   baclofen (LIORESAL) 10  MG tablet Take 10 mg by mouth 2 (two) times daily as needed for muscle spasms.   10/26/2022   beta carotene w/minerals (OCUVITE) tablet Take 1 tablet by mouth in the morning.   10/26/2022   clonazePAM (KLONOPIN) 1 MG tablet Take 1 mg by mouth at bedtime.   10/26/2022   Cyanocobalamin (B-12 PO) Take 1 tablet by mouth daily.   Past Week   diclofenac Sodium (VOLTAREN) 1 % GEL Apply 2 g topically 4 (four) times daily as needed (pain.).   Past Week   docusate sodium (COLACE) 100 MG capsule Take 100 mg by mouth daily as needed for mild constipation.   Past Week   furosemide (LASIX) 20 MG tablet  Take 20 mg by mouth in the morning.      HYDROcodone-acetaminophen (NORCO) 10-325 MG tablet Take 1 tablet by mouth in the morning, at noon, in the evening, and at bedtime.   10/26/2022   ibuprofen (ADVIL) 800 MG tablet Take 1 tablet (800 mg total) by mouth every 8 (eight) hours as needed. (Patient taking differently: Take 800 mg by mouth in the morning, at noon, and at bedtime.) 30 tablet 0 Past Week   pregabalin (LYRICA) 100 MG capsule Take 100 mg by mouth at bedtime.  0 10/26/2022   promethazine (PHENERGAN) 25 MG tablet Take 25 mg by mouth every 6 (six) hours as needed for vomiting or nausea.   Past Week   tiZANidine (ZANAFLEX) 4 MG tablet Take 4 mg by mouth in the morning and at bedtime.   Past Week   venlafaxine XR (EFFEXOR-XR) 150 MG 24 hr capsule Take 150 mg by mouth daily.   10/26/2022   venlafaxine XR (EFFEXOR-XR) 75 MG 24 hr capsule Take 75 mg by mouth daily.   10/26/2022   ZETIA 10 MG tablet Take 10 mg by mouth in the morning.   10/26/2022   urea (CARMOL) 40 % CREA Apply 1 Application topically daily. (Patient not taking: Reported on 07/23/2022) 227 g 1     ALLERGIES:   Allergies  Allergen Reactions   Pravachol [Pravastatin Sodium] Other (See Comments)    Muscles  aches     Augmentin [Amoxicillin-Pot Clavulanate] Diarrhea   Benadryl Allergy [Diphenhydramine Hcl] Other (See Comments)    leg jerking/restless legs   Codeine Nausea Only    Patient tolerates percocet (oxycodone-acetaminophen) with no issue    REVIEW OF SYSTEMS:  A comprehensive review of systems was negative except for: Musculoskeletal: positive for arthralgias and bone pain   FAMILY HISTORY:   Family History  Problem Relation Age of Onset   Cancer Mother    Heart Problems Father    Diabetes Sister    Heart Problems Sister     SOCIAL HISTORY:   Social History   Tobacco Use   Smoking status: Some Days    Packs/day: 0.25    Years: 29.00    Total pack years: 7.25    Types: Cigarettes   Smokeless tobacco: Never   Substance Use Topics   Alcohol use: No     EXAMINATION:  Vital signs in last 24 hours: Temp:  [98 F (36.7 C)] 98 F (36.7 C) (03/04 0609) Pulse Rate:  [58] 58 (03/04 0609) Resp:  [16] 16 (03/04 0609) BP: (117)/(74) 117/74 (03/04 0609) SpO2:  [98 %] 98 % (03/04 0609) Weight:  [70.8 kg] 70.8 kg (03/04 0534)  BP 117/74   Pulse (!) 58   Temp 98 F (36.7 C) (Oral)   Resp 16  Ht '5\' 8"'$  (1.727 m)   Wt 70.8 kg   SpO2 98%   BMI 23.72 kg/m   General Appearance:    Alert, cooperative, no distress, appears stated age  Head:    Normocephalic, without obvious abnormality, atraumatic  Eyes:    PERRL, conjunctiva/corneas clear, EOM's intact, fundi    benign, both eyes  Ears:    Normal TM's and external ear canals, both ears  Nose:   Nares normal, septum midline, mucosa normal, no drainage    or sinus tenderness  Throat:   Lips, mucosa, and tongue normal; teeth and gums normal  Neck:   Supple, symmetrical, trachea midline, no adenopathy;    thyroid:  no enlargement/tenderness/nodules; no carotid   bruit or JVD  Back:     Symmetric, no curvature, ROM normal, no CVA tenderness  Lungs:     Clear to auscultation bilaterally, respirations unlabored  Chest Wall:    No tenderness or deformity   Heart:    Regular rate and rhythm, S1 and S2 normal, no murmur, rub   or gallop  Breast Exam:    No tenderness, masses, or nipple abnormality  Abdomen:     Soft, non-tender, bowel sounds active all four quadrants,    no masses, no organomegaly  Genitalia:    Normal female without lesion, discharge or tenderness  Rectal:    Normal tone, no masses or tenderness;   guaiac negative stool  Extremities:   Extremities normal, atraumatic, no cyanosis or edema  Pulses:   2+ and symmetric all extremities  Skin:   Skin color, texture, turgor normal, no rashes or lesions  Lymph nodes:   Cervical, supraclavicular, and axillary nodes normal  Neurologic:   CNII-XII intact, normal strength, sensation and  reflexes    throughout    Musculoskeletal:  ROM 0-120, Ligaments intact,  Imaging Review Plain radiographs demonstrate severe degenerative joint disease of the right knee. The overall alignment is neutral. The bone quality appears to be good for age and reported activity level.  Assessment/Plan: Primary osteoarthritis, right knee   The patient history, physical examination and imaging studies are consistent with advanced degenerative joint disease of the right knee. The patient has failed conservative treatment.  The clearance notes were reviewed.  After discussion with the patient it was felt that Total Knee Replacement was indicated. The procedure,  risks, and benefits of total knee arthroplasty were presented and reviewed. The risks including but not limited to aseptic loosening, infection, blood clots, vascular injury, stiffness, patella tracking problems complications among others were discussed. The patient acknowledged the explanation, agreed to proceed with the plan.  Preoperative templating of the joint replacement has been completed, documented, and submitted to the Operating Room personnel in order to optimize intra-operative equipment management.    Patient's anticipated LOS is less than 2 midnights, meeting these requirements: - Lives within 1 hour of care - Has a competent adult at home to recover with post-op recover - NO history of  - Chronic pain requiring opiods  - Diabetes  - Coronary Artery Disease  - Heart failure  - Heart attack  - Stroke  - DVT/VTE  - Cardiac arrhythmia  - Respiratory Failure/COPD  - Renal failure  - Anemia  - Advanced Liver disease     Donia Ast 10/27/2022, 7:04 AM

## 2022-10-27 NOTE — Progress Notes (Signed)
Orthopedic Tech Progress Note Patient Details:  Sonya Allen 30-Jul-1956 [id number]  CPM Right Knee CPM Right Knee: Off Right Knee Flexion (Degrees): 90 Right Knee Extension (Degrees): 0  Post Interventions Patient Tolerated: Well Instructions Provided: Adjustment of device  Seniyah Esker OTR/L 10/27/2022, 1:36 PM

## 2022-10-27 NOTE — Progress Notes (Addendum)
Orthopedic Tech Progress Note Patient Details:  Sonya Allen 08-Sep-1955 [id number]  CPM Right Knee CPM Right Knee: On Right Knee Flexion (Degrees): 90 Right Knee Extension (Degrees): 0  Post Interventions Patient Tolerated: Well Instructions Provided: Adjustment of device Ortho Devices Type of Ortho Device: CPM padding, Bone foam zero knee Ortho Device/Splint Location: right knee Ortho Device/Splint Interventions: Ordered, Application, Adjustment   Post Interventions Patient Tolerated: Well Instructions Provided: Adjustment of device  [name] OTR/L 10/27/2022, 11:45 AM

## 2022-10-27 NOTE — Anesthesia Postprocedure Evaluation (Signed)
Anesthesia Post Note  Patient: Sonya Allen  Procedure(s) Performed: TOTAL KNEE ARTHROPLASTY (Right: Knee)     Patient location during evaluation: PACU Anesthesia Type: Regional and General Level of consciousness: awake and alert, oriented and patient cooperative Pain management: pain level controlled Vital Signs Assessment: post-procedure vital signs reviewed and stable Respiratory status: spontaneous breathing, nonlabored ventilation and respiratory function stable Cardiovascular status: blood pressure returned to baseline and stable Postop Assessment: no apparent nausea or vomiting Anesthetic complications: no   No notable events documented.  Last Vitals:  Vitals:   10/27/22 0851 10/27/22 0900  BP: 139/65 124/64  Pulse:  74  Resp: 13 16  Temp: (!) 35.8 C   SpO2: 100% 100%    Last Pain:  Vitals:   10/27/22 0900  TempSrc:   PainSc: [place]

## 2022-10-27 NOTE — Transfer of Care (Signed)
Immediate Anesthesia Transfer of Care Note  Patient: Sonya Allen  Procedure(s) Performed: TOTAL KNEE ARTHROPLASTY (Right: Knee)  Patient Location: PACU  Anesthesia Type:GA combined with regional for post-op pain  Level of Consciousness: drowsy  Airway & Oxygen Therapy: Patient Spontanous Breathing and Patient connected to face mask oxygen  Post-op Assessment: Report given to RN and Post -op Vital signs reviewed and stable  Post vital signs: Reviewed and stable  Last Vitals:  Vitals Value Taken Time  BP 139/65 10/27/22 0851  Temp    Pulse    Resp 12 10/27/22 0852  SpO2 98% 10/27/22  0852  Vitals shown include unvalidated device data.  Last Pain:  Vitals:   10/27/22 0609  TempSrc: Oral         Complications: No notable events documented.

## 2022-10-27 NOTE — Anesthesia Procedure Notes (Signed)
Procedure Name: LMA Insertion Date/Time: 10/27/2022 7:27 AM  Performed by: West Pugh, CRNAPre-anesthesia Checklist: Patient identified, Emergency Drugs available, Suction available, Patient being monitored and Timeout performed Patient Re-evaluated:Patient Re-evaluated prior to induction Oxygen Delivery Method: Circle system utilized Preoxygenation: Pre-oxygenation with 100% oxygen Induction Type: IV induction Ventilation: Mask ventilation without difficulty LMA: LMA inserted LMA Size: 4.0 Placement Confirmation: positive ETCO2 and breath sounds checked- equal and bilateral Tube secured with: Tape Dental Injury: Teeth and Oropharynx as per pre-operative assessment  Comments: LMA #4 with gastric port attempted but would not seat. Regular LMA #4 seated and confirmed with ETCO2 and equal breath sounds.

## 2022-10-27 NOTE — Evaluation (Signed)
Physical Therapy Evaluation Patient Details Name: Sonya Allen MRN: [id number] DOB: [birth year] Today's Date: 10/27/2022  History of Present Illness  67 yo female presents to therapy s/p R TKA on 10/27/2022 due to failure of conservative measures. Pt pmh includes but is not limited to fibromyalgia, HDL, R RTC repair, back and cervical sx, R ankle fusion, and L TKA (2024)  Clinical Impression   SAMANNTHA HOGGATT is a 67 y.o. female POD 0 s/p R TKA. Patient reports IND with mobility at baseline. Patient is now limited by functional impairments (see PT problem list below) and requires min guard for bed mobility and min guard and cues for transfers. Patient was able to ambulate 34 feet with RW and min guard level of assist. Patient instructed in exercise to facilitate ROM and circulation to manage edema. Patient will benefit from continued skilled PT interventions to address impairments and progress towards PLOF. Acute PT will follow to progress mobility and stair training in preparation for safe discharge home.      Recommendations for follow up therapy are one component of a multi-disciplinary discharge planning process, led by the attending physician.  Recommendations may be updated based on patient status, additional functional criteria and insurance authorization.  Follow Up Recommendations Follow physician's recommendations for discharge plan and follow up therapies      Assistance Recommended at Discharge Intermittent Supervision/Assistance  Patient can return home with the following  A little help with walking and/or transfers;A little help with bathing/dressing/bathroom;Assistance with cooking/housework;Assist for transportation;Help with stairs or ramp for entrance    Equipment Recommendations None recommended by PT (pt reports having DME in home setting)  Recommendations for Other Services       Functional Status Assessment Patient has had a recent decline in their functional status and  demonstrates the ability to make significant improvements in function in a reasonable and predictable amount of time.     Precautions / Restrictions Precautions Precautions: Knee;Fall Restrictions Weight Bearing Restrictions: No RLE Weight Bearing: Weight bearing as tolerated      Mobility  Bed Mobility Overal bed mobility: Needs Assistance Bed Mobility: Supine to Sit     Supine to sit: Min guard     General bed mobility comments: increased time and HOB elevated    Transfers Overall transfer level: Needs assistance   Transfers: Sit to/from Stand Sit to Stand: Min guard           General transfer comment: cues for proper UE  placement    Ambulation/Gait Ambulation/Gait assistance: Min guard Gait Distance (Feet): 34 Feet Assistive device: Rolling walker (2 wheels) Gait Pattern/deviations: Step-to pattern, Decreased dorsiflexion - right, Trunk flexed Gait velocity: decreased     General Gait Details: R flat foot strike  Stairs            Wheelchair Mobility    Modified Rankin (Stroke Patients Only)       Balance Overall balance assessment: Needs assistance Sitting-balance support: Feet supported Sitting balance-Leahy Scale: Fair     Standing balance support: Bilateral upper extremity supported Standing balance-Leahy Scale: Poor                               Pertinent Vitals/Pain Pain Assessment Pain Assessment: 0-10 Pain Score: 6  Pain Location: R knee Pain Descriptors / Indicators: Aching, Operative site guarding, Discomfort Pain Intervention(s): Limited activity within patient's tolerance, Monitored during session, Premedicated before session, Ice applied, Patient requesting pain  meds-RN notified    Home Living Family/patient expects to be discharged to:: Private residence Living Arrangements: Spouse/significant other Available Help at Discharge: Family Type of Home: House Home Access: Stairs to enter Entrance  Stairs-Rails: Left;Right (cannot reach B at same time) Entrance Stairs-Number of Steps: 4   Home Layout: One level [place]: Conservation officer, nature (2 wheels);Cane - single point      Prior Function Prior Level of Function : Independent/Modified Independent;Driving             Mobility Comments: IND with ADLs, self care tasks, and IADLs. driving. no AD       Hand Dominance        Extremity/Trunk Assessment        Lower Extremity Assessment Lower Extremity Assessment: RLE deficits/detail RLE Deficits / Details: absent DF/PF in R ankle due to fusion, SLR with <5 degree lag RLE Sensation: decreased light touch       Communication   Communication: No difficulties  Cognition Arousal/Alertness: Awake/alert Behavior During Therapy: WFL for tasks assessed/performed Overall Cognitive Status: Within Functional Limits for tasks assessed                                          General Comments      Exercises Total Joint Exercises Ankle Circles/Pumps: PROM, Left, 20 reps Heel Slides: AROM, Right, 10 reps Straight Leg Raises: AROM, Right, 5 reps   Assessment/Plan    PT Assessment Patient needs continued PT services  PT Problem List Decreased strength;Decreased range of motion;Decreased activity tolerance;Decreased balance;Decreased mobility;Decreased coordination;Pain;Impaired sensation       PT Treatment Interventions DME instruction;Gait training;Stair training;Functional mobility training;Therapeutic activities;Therapeutic exercise;Balance training;Neuromuscular re-education;Patient/family education;Modalities    PT Goals (Current goals can be found in the Care Plan section)  Acute Rehab PT Goals Patient Stated Goal: to be able to walk around the mall without pain PT Goal Formulation: With patient Time For Goal Achievement: 11/10/22 Potential to Achieve Goals: Good    Frequency 7X/week     Co-evaluation               AM-PAC PT "6  Clicks" Mobility  Outcome Measure Help needed turning from your back to your side while in a flat bed without using bedrails?: None Help needed moving from lying on your back to sitting on the side of a flat bed without using bedrails?: A Little Help needed moving to and from a bed to a chair (including a wheelchair)?: A Little Help needed standing up from a chair using your arms (e.g., wheelchair or bedside chair)?: A Little Help needed to walk in hospital room?: A Little Help needed climbing 3-5 steps with a railing? : Total 6 Click Score: 17    End of Session         PT Visit Diagnosis: Unsteadiness on feet (R26.81);Other abnormalities of gait and mobility (R26.89);Muscle weakness (generalized) (M62.81);Difficulty in walking, not elsewhere classified (R26.2);Pain Pain - Right/Left: Right Pain - part of body: Knee    Time: [id number] PT Time Calculation (min) (ACUTE ONLY): 47 min   Charges:   PT Evaluation $PT Eval Low Complexity: 1 Low PT Treatments $Gait Training: 8-22 mins $Therapeutic Activity: 8-22 mins        Baird Lyons, PT   Adair Patter 10/27/2022, 2:23 PM

## 2022-10-27 NOTE — Anesthesia Procedure Notes (Signed)
Anesthesia Regional Block: Adductor canal block   Pre-Anesthetic Checklist: , timeout performed,  Correct Patient, Correct Site, Correct Laterality,  Correct Procedure, Correct Position, site marked,  Risks and benefits discussed,  Surgical consent,  Pre-op evaluation,  At surgeon's request and post-op pain management  Laterality: Right  Prep: Maximum Sterile Barrier Precautions used, chloraprep       Needles:  Injection technique: Single-shot  Needle Type: Echogenic Stimulator Needle     Needle Length: 9cm  Needle Gauge: 22     Additional Needles:   Procedures:,,,, ultrasound used (permanent image in chart),,    Narrative:  Start time: 10/27/2022 7:05 AM End time: 10/27/2022 7:10 AM Injection made incrementally with aspirations every 5 mL.  Performed by: Personally  Anesthesiologist: Pervis Hocking, DO  Additional Notes: Monitors applied. No increased pain on injection. No increased resistance to injection. Injection made in 5cc increments. Good needle visualization. Patient tolerated procedure well.

## 2022-10-27 NOTE — Op Note (Signed)
TOTAL KNEE REPLACEMENT OPERATIVE NOTE:  10/27/2022  11:06 AM  PATIENT:  Sonya Allen  67 y.o. female  PRE-OPERATIVE DIAGNOSIS:  Osteoarthritis of right knee M17.11  POST-OPERATIVE DIAGNOSIS:  Osteoarthritis of right knee M17.11  PROCEDURE:  Procedure(s): TOTAL KNEE ARTHROPLASTY  SURGEON:  Surgeon(s): Vickey Huger, MD  PHYSICIAN ASSISTANT: Carlyon Shadow, PA-C  ANESTHESIA:   spinal  SPECIMEN: None  COUNTS:  Correct  TOURNIQUET:   Total Tourniquet Time Documented: Thigh (Right) - 38 minutes Total: Thigh (Right) - 38 minutes   DICTATION:  Indication for procedure:    The patient is a 67 y.o. female who has failed conservative treatment for Osteoarthritis of right knee M17.11.  Informed consent was obtained prior to anesthesia. The risks versus benefits of the operation were explain and in a way the patient can, and did, understand.    Description of procedure:     The patient was taken to the operating room and placed under anesthesia.  The patient was positioned in the usual fashion taking care that all body parts were adequately padded and/or protected.  A tourniquet was applied and the leg prepped and draped in the usual sterile fashion.  The extremity was exsanguinated with the esmarch and tourniquet inflated to 250 mmHg.  Pre-operative range of motion was normal.    A midline incision approximately 6-7 inches long was made with a #10 blade.  A new blade was used to make a parapatellar arthrotomy going 2-3 cm into the quadriceps tendon, over the patella, and alongside the medial aspect of the patellar tendon.  A synovectomy was then performed with the #10 blade and forceps. I then elevated the deep MCL off the medial tibial metaphysis subperiosteally around to the semimembranosus attachment.    I everted the patella and used calipers to measure patellar thickness.  I used the reamer to ream down to appropriate thickness to recreate the native thickness.  I then removed  excess bone with the rongeur and sagittal saw.  I used the appropriately sized template and drilled the three lug holes.  I then put the trial in place and measured the thickness with the calipers to ensure recreation of the native thickness.  The trial was then removed and the patella subluxed and the knee brought into flexion.  A homan retractor was place to retract and protect the patella and lateral structures.  A Z-retractor was place medially to protect the medial structures.  The extra-medullary alignment system was used to make cut the tibial articular surface perpendicular to the anamotic axis of the tibia and in 3 degrees of posterior slope.  The cut surface and alignment jig was removed.  I then used the intramedullary alignment guide to make a valgus cut on the distal femur.  I then marked out the epicondylar axis on the distal femur.   I then used the anterior referencing sizer and measured the femur to be a size 6.  The 4-In-1 cutting block was screwed into place in external rotation matching the posterior condylar angle, making our cuts perpendicular to the epicondylar axis.  Anterior, posterior and chamfer cuts were made with the sagittal saw.  The cutting block and cut pieces were removed.  A lamina spreader was placed in 90 degrees of flexion.  The ACL, PCL, menisci, and posterior condylar osteophytes were removed.  A 10 mm spacer blocked was found to offer good flexion and extension gap balance after minimal in degree releasing.   The scoop retractor was then placed  and the femoral finishing block was pinned in place.  The small sagittal saw was used as well as the lug drill to finish the femur.  The block and cut surfaces were removed and the medullary canal hole filled with autograft bone from the cut pieces.  The tibia was delivered forward in deep flexion and external rotation.  A size D tray was selected and pinned into place centered on the medial 1/3 of the tibial tubercle.  The  reamer and keel was used to prepare the tibia through the tray.    I then trialed with the size 6 femur, size D tibia, a 10 mm insert and the 35 patella.  I had excellent flexion/extension gap balance, excellent patella tracking.  Flexion was full and beyond 120 degrees; extension was zero.  These components were chosen and the staff opened them to me on the back table while the knee was lavaged copiously and the cement mixed.  The soft tissue was infiltrated with 60cc of exparel 1.3% through a 21 gauge needle.  I cemented in the components and removed all excess cement.  The polyethylene tibial component was snapped into place and the knee placed in extension while cement was hardening.  The capsule was infilltrated with a 60cc exparel/marcaine/saline mixture.   Once the cement was hard, the tourniquet was let down.  Hemostasis was obtained.  The arthrotomy was closed using a #1 stratofix running suture.  The deep soft tissues were closed with #0 vicryls and the subcuticular layer closed with #2-0 vicryl.  The skin was reapproximated and closed with 3.0 Monocryl.  The wound was covered with steristrips, aquacel dressing, and a TED stocking.   The patient was then awakened, extubated, and taken to the recovery room in stable condition.  BLOOD LOSS:  0000000 COMPLICATIONS:  None.  PLAN OF CARE: Admit for overnight observation  PATIENT DISPOSITION:  PACU - hemodynamically stable.   Delay start of Pharmacological VTE agent (>24hrs) due to surgical blood loss or risk of bleeding:  yes  Please fax a copy of this op note to my office at [phone] (please only include page 1 and 2 of the Case Information op note)

## 2022-10-28 DIAGNOSIS — M1711 Unilateral primary osteoarthritis, right knee: Secondary | ICD-10-CM | POA: Diagnosis not present

## 2022-10-28 DIAGNOSIS — F17210 Nicotine dependence, cigarettes, uncomplicated: Secondary | ICD-10-CM | POA: Diagnosis not present

## 2022-10-28 DIAGNOSIS — Z96652 Presence of left artificial knee joint: Secondary | ICD-10-CM | POA: Diagnosis not present

## 2022-10-28 MED ORDER — OXYCODONE HCL 5 MG PO TABS: 5.0000 mg | ORAL_TABLET | Freq: Four times a day (QID) | ORAL | 0 refills | Status: DC | PRN

## 2022-10-28 MED ORDER — METHOCARBAMOL 500 MG PO TABS: 500.0000 mg | ORAL_TABLET | Freq: Four times a day (QID) | ORAL | 0 refills | Status: DC | PRN

## 2022-10-28 MED ORDER — ASPIRIN 81 MG PO CHEW: 81.0000 mg | CHEWABLE_TABLET | Freq: Two times a day (BID) | ORAL | 0 refills | Status: DC

## 2022-10-28 NOTE — TOC Transition Note (Signed)
Transition of Care [facility]) - CM/SW Discharge Note   Patient Details  Name: Sonya Allen MRN: [id number] Date of Birth: 06-23-1956  [facility] CM/SW Contact:  Lennart Pall, LCSW Phone Number: 10/28/2022, 10:06 AM   Clinical Narrative:     Met with pt who confirms she has DME at home, however, CPM to be delivered by [employer] to home.  OPPT already arranged with [facility].  No further TOC needs.  Final next level of care: OP Rehab Barriers to Discharge: No Barriers Identified   Patient Goals and CMS Choice      Discharge Placement                         Discharge Plan and Services Additional resources added to the After Visit Summary for                  DME Arranged: N/A DME Agency: NA                  Social Determinants of Health (SDOH) Interventions SDOH Screenings   Food Insecurity: No Food Insecurity (10/27/2022)  Housing: Low Risk  (10/27/2022)  Transportation Needs: No Transportation Needs (10/27/2022)  Utilities: Not At Risk (10/27/2022)  Tobacco Use: High Risk (10/27/2022)     Readmission Risk Interventions     No data to display

## 2022-10-28 NOTE — Progress Notes (Signed)
Physical Therapy Treatment Patient Details Name: Sonya Allen MRN: [id number] DOB: 25-Aug-1956 Today's Date: 10/28/2022   History of Present Illness 67 yo female presents to therapy s/p R TKA on 10/27/2022 due to failure of conservative measures. Pt pmh includes but is not limited to fibromyalgia, HDL, R RTC repair, back and cervical sx, R ankle fusion, and L TKA (2024)    PT Comments    POD # 1 am session General Comments: AxO x 3 pleasant and motivated.  Had "other" knee replaced earlier this year.  Progressing well. Pt already OOB in recliner.  Spouse Ron present.  Assisted with amb in hallway, practiced stairs then Educated on HEP. Addressed all mobility questions, discussed appropriate activity, educated on use of ICE.  Pt ready for D/C to home.    Recommendations for follow up therapy are one component of a multi-disciplinary discharge planning process, led by the attending physician.  Recommendations may be updated based on patient status, additional functional criteria and insurance authorization.  Follow Up Recommendations  Follow physician's recommendations for discharge plan and follow up therapies     Assistance Recommended at Discharge Intermittent Supervision/Assistance  Patient can return home with the following A little help with walking and/or transfers;A little help with bathing/dressing/bathroom;Assistance with cooking/housework;Assist for transportation;Help with stairs or ramp for entrance   Equipment Recommendations  None recommended by PT    Recommendations for Other Services       Precautions / Restrictions Precautions Precautions: Knee;Fall Precaution Comments: instructed no pillow under knee Restrictions Weight Bearing Restrictions: No RLE Weight Bearing: Weight bearing as tolerated     Mobility  Bed Mobility               General bed mobility comments: OOB in recliner    Transfers Overall transfer level: Modified independent Equipment  used: Rolling walker (2 wheels) Transfers: Sit to/from Stand Sit to Stand: Supervision, Modified independent (Device/Increase time)           General transfer comment: good use of hands to steady self.  Good safety cognition.    Ambulation/Gait Ambulation/Gait assistance: Supervision Gait Distance (Feet): 75 Feet Assistive device: Rolling walker (2 wheels) Gait Pattern/deviations: Step-to pattern, Decreased dorsiflexion - right, Trunk flexed Gait velocity: decreased     General Gait Details: Hx R ankle fusion. Tolerated a functional distance.  Minimal c/o pain.  NO dizziness.   Stairs Stairs: Yes Stairs assistance: Supervision, Min guard Stair Management: Two rails, Step to pattern Number of Stairs: 2 General stair comments: with Spouse Ron "hands on" with < 25% VC's on proper sequencing as well as safety using belt.  Performed well.   Wheelchair Mobility    Modified Rankin (Stroke Patients Only)       Balance                                            Cognition Arousal/Alertness: Awake/alert Behavior During Therapy: WFL for tasks assessed/performed Overall Cognitive Status: Within Functional Limits for tasks assessed                                 General Comments: AxO x 3 pleasant and motivated.  Had "other" knee replaced earlier this year.  Progressing well.        Exercises      General Comments  Pertinent Vitals/Pain Pain Assessment Pain Assessment: 0-10 Pain Score: 3  Pain Location: R knee Pain Descriptors / Indicators: Aching, Operative site guarding, Discomfort Pain Intervention(s): Monitored during session, Premedicated before session, Repositioned, Ice applied    Home Living                          Prior Function            PT Goals (current goals can now be found in the care plan section) Progress towards PT goals: Progressing toward goals    Frequency    7X/week      PT  Plan Current plan remains appropriate    Co-evaluation              AM-PAC PT "6 Clicks" Mobility   Outcome Measure  Help needed turning from your back to your side while in a flat bed without using bedrails?: None Help needed moving from lying on your back to sitting on the side of a flat bed without using bedrails?: None Help needed moving to and from a bed to a chair (including a wheelchair)?: None Help needed standing up from a chair using your arms (e.g., wheelchair or bedside chair)?: None Help needed to walk in hospital room?: None Help needed climbing 3-5 steps with a railing? : A Little 6 Click Score: 23    End of Session Equipment Utilized During Treatment: Gait belt Activity Tolerance: Patient tolerated treatment well Patient left: in chair;with call bell/phone within reach;with family/visitor present Nurse Communication: Mobility status PT Visit Diagnosis: Unsteadiness on feet (R26.81);Other abnormalities of gait and mobility (R26.89);Muscle weakness (generalized) (M62.81);Difficulty in walking, not elsewhere classified (R26.2);Pain Pain - Right/Left: Right Pain - part of body: Knee     Time: [id number] PT Time Calculation (min) (ACUTE ONLY): 28 min  Charges:  $Gait Training: 8-22 mins $Therapeutic Activity: 8-22 mins                     {Margarete Horace  PTA Acute  [employer] M-F          [phone] Weekend pager [phone]

## 2022-10-28 NOTE — Progress Notes (Signed)
SPORTS MEDICINE AND JOINT REPLACEMENT  Lara Mulch, MD    Carlyon Shadow, PA-C [place], [place], [place]  [address]                             [phone]   PROGRESS NOTE  Subjective:  negative for Chest Pain  negative for Shortness of Breath  negative for Nausea/Vomiting   negative for Calf Pain  negative for Bowel Movement   Tolerating Diet: yes         Patient reports pain as 5 on 0-10 scale.    Objective: Vital signs in last 24 hours:   Patient Vitals for the past 24 hrs:  BP Temp Temp src Pulse Resp SpO2  10/28/22 0949 (!) 95/57 98.4 F (36.9 C) Oral 64 16 99 %  10/28/22 0601 122/61 98.9 F (37.2 C) Oral 66 16 99 %  10/27/22 2134 120/70 98.7 F (37.1 C) Oral 64 18 98 %  10/27/22 1801 (!) 111/57 98 F (36.7 C) -- 69 17 98 %  10/27/22 1256 126/72 97.8 F (36.6 C) -- 71 18 100 %    '@flow'$ {1959:LAST@   Intake/Output from previous day:   03/04 0701 - 03/05 0700 In: 3639 [P.O.:960; I.V.:2479] Out: 400 [Urine:400]   Intake/Output this shift:   03/05 0701 - 03/05 1900 In: 471.8 [P.O.:240; I.V.:231.8] Out: -    Intake/Output      03/04 0701 03/05 0700 03/05 0701 03/06 0700   P.O. 960 240   I.V. (mL/kg) 2479 (35) 231.8 (3.3)   IV Piggyback 200 0   Total Intake(mL/kg) 3639 (51.4) 471.8 (6.7)   Urine (mL/kg/hr) 400 (0.2)    Stool 0    Total Output 400    Net +3239 +471.8        Urine Occurrence 2 x    Stool Occurrence 0 x       LABORATORY DATA: Recent Labs    10/27/22 0600  WBC 8.4  HGB 11.9*  HCT 36.6  PLT 185   Recent Labs    10/27/22 0600  NA 141  K 3.6  CL 106  CO2 26  BUN 20  CREATININE 0.59  GLUCOSE 100*  CALCIUM 8.7*   Lab Results  Component Value Date   INR 1.04 07/15/2013    Examination:  General appearance: alert, cooperative, and no distress Extremities: extremities normal, atraumatic, no cyanosis or edema  Wound Exam: clean, dry, intact   Drainage:  None: wound tissue dry  Motor Exam: Quadriceps and  Hamstrings Intact  Sensory Exam: Superficial Peroneal, Deep Peroneal, and Tibial normal   Assessment:    1 Day Post-Op  Procedure(s) (LRB): TOTAL KNEE ARTHROPLASTY (Right)  ADDITIONAL DIAGNOSIS:  Principal Problem:   S/P total knee replacement     Plan: Physical Therapy as ordered Weight Bearing as Tolerated (WBAT)  DVT Prophylaxis:  Aspirin  DISCHARGE PLAN: Home  Patient doing well, ready for D/C home       Patient's anticipated LOS is less than 2 midnights, meeting these requirements: - Lives within 1 hour of care - Has a competent adult at home to recover with post-op recover - NO history of  - Chronic pain requiring opiods  - Diabetes  - Coronary Artery Disease  - Heart failure  - Heart attack  - Stroke  - DVT/VTE  - Cardiac arrhythmia  - Respiratory Failure/COPD  - Renal failure  - Anemia  - Advanced Liver disease  Donia Ast 10/28/2022, 12:37 PM

## 2022-10-28 NOTE — Discharge Summary (Signed)
SPORTS MEDICINE & JOINT REPLACEMENT   Sonya Mulch, MD   Sonya Shadow, PA-C [place], [place],   [address]                             [phone]  PATIENT ID: Sonya Allen        MRN:  [id number]          DOB/AGE: [birth year] / 67 y.o.    DISCHARGE SUMMARY  ADMISSION DATE:    10/27/2022 DISCHARGE DATE:   10/28/2022   ADMISSION DIAGNOSIS: S/P total knee replacement [Z96.659]    DISCHARGE DIAGNOSIS:  Osteoarthritis of right knee M17.11    ADDITIONAL DIAGNOSIS: Principal Problem:   S/P total knee replacement  Past Medical History:  Diagnosis Date   Anxiety    Arthritis    Bilateral dry eyes    Takes Systane Drops   Bronchitis    hx of   Constipation due to pain medication    Depression    Edema of extremities    Takes Lasix   Fibromyalgia    History of kidney stones    Hypercholesteremia    PONV (postoperative nausea and vomiting)    PONV (postoperative nausea and vomiting) 02/07/2021   Restless leg syndrome    Takes Klonopin    PROCEDURE: Procedure(s): TOTAL KNEE ARTHROPLASTY on 10/27/2022  CONSULTS:    HISTORY:  See H&P in chart  HOSPITAL COURSE:  Sonya Allen is a 67 y.o. admitted on 10/27/2022 and found to have a diagnosis of Osteoarthritis of right knee M17.11.  After appropriate laboratory studies were obtained  they were taken to the operating room on 10/27/2022 and underwent Procedure(s): TOTAL KNEE ARTHROPLASTY.   They were given perioperative antibiotics:  Anti-infectives (From admission, onward)    Start     Dose/Rate Route Frequency Ordered Stop   10/27/22 0600  ceFAZolin (ANCEF) IVPB 2g/100 mL premix        2 g 200 mL/hr over 30 Minutes Intravenous On call to O.R. 10/27/22 [id number] 10/27/22 0758     .  Patient given tranexamic acid IV or topical and exparel intra-operatively.  Tolerated the procedure well.    POD# 1: Vital signs were stable.  Patient denied Chest pain, shortness of breath, or calf pain.  Patient was started on  Aspirin twice daily at 8am.  Consults to PT, OT, and care management were made.  The patient was weight bearing as tolerated.  CPM was placed on the operative leg 0-90 degrees for 6-8 hours a day. When out of the CPM, patient was placed in the foam block to achieve full extension. Incentive spirometry was taught.  Dressing was changed.       POD #2, Continued  PT for ambulation and exercise program.  IV saline locked.  O2 discontinued.    The remainder of the hospital course was dedicated to ambulation and strengthening.   The patient was discharged on 1 Day Post-Op in  Good condition.  Blood products given:none  DIAGNOSTIC STUDIES: Recent vital signs: Patient Vitals for the past 24 hrs:  BP Temp Temp src Pulse Resp SpO2  10/28/22 0949 (!) 95/57 98.4 F (36.9 C) Oral 64 16 99 %  10/28/22 0601 122/61 98.9 F (37.2 C) Oral 66 16 99 %  10/27/22 2134 120/70 98.7 F (37.1 C) Oral 64 18 98 %  10/27/22 1801 (!) 111/57 98 F (36.7 C) -- 69 17 98 %  10/27/22 1256 126/72 97.8 F (36.6 C) -- 71 18 100 %       Recent laboratory studies: Recent Labs    10/27/22 0600  WBC 8.4  HGB 11.9*  HCT 36.6  PLT 185   Recent Labs    10/27/22 0600  NA 141  K 3.6  CL 106  CO2 26  BUN 20  CREATININE 0.59  GLUCOSE 100*  CALCIUM 8.7*   Lab Results  Component Value Date   INR 1.04 07/15/2013     Recent Radiographic Studies :  No results found.  DISCHARGE INSTRUCTIONS: Discharge Instructions     Call MD / Call 911   Complete by: As directed    If you experience chest pain or shortness of breath, CALL 911 and be transported to the hospital emergency room.  If you develope a fever above 101 F, pus (white drainage) or increased drainage or redness at the wound, or calf pain, call your surgeon's office.   Constipation Prevention   Complete by: As directed    Drink plenty of fluids.  Prune juice may be helpful.  You may use a stool softener, such as Colace (over the counter) 100 mg twice a  day.  Use MiraLax (over the counter) for constipation as needed.   Diet - low sodium heart healthy   Complete by: As directed    Discharge instructions   Complete by: As directed    INSTRUCTIONS AFTER JOINT REPLACEMENT   Remove items at home which could result in a fall. This includes throw rugs or furniture in walking pathways ICE to the affected joint every three hours while awake for 30 minutes at a time, for at least the first 3-5 days, and then as needed for pain and swelling.  Continue to use ice for pain and swelling. You may notice swelling that will progress down to the foot and ankle.  This is normal after surgery.  Elevate your leg when you are not up walking on it.   Continue to use the breathing machine you got in the hospital (incentive spirometer) which will help keep your temperature down.  It is common for your temperature to cycle up and down following surgery, especially at night when you are not up moving around and exerting yourself.  The breathing machine keeps your lungs expanded and your temperature down.   DIET:  As you were doing prior to hospitalization, we recommend a well-balanced diet.  DRESSING / WOUND CARE / SHOWERING  Keep the surgical dressing until follow up.  The dressing is water proof, so you can shower without any extra covering.  IF THE DRESSING FALLS OFF or the wound gets wet inside, change the dressing with sterile gauze.  Please use good hand washing techniques before changing the dressing.  Do not use any lotions or creams on the incision until instructed by your surgeon.    ACTIVITY  Increase activity slowly as tolerated, but follow the weight bearing instructions below.   No driving for 6 weeks or until further direction given by your physician.  You cannot drive while taking narcotics.  No lifting or carrying greater than 10 lbs. until further directed by your surgeon. Avoid periods of inactivity such as sitting longer than an hour when not  asleep. This helps prevent blood clots.  You may return to work once you are authorized by your doctor.     WEIGHT BEARING   Weight bearing as tolerated with assist device (walker, cane, etc) as  directed, use it as long as suggested by your surgeon or therapist, typically at least 4-6 weeks.   EXERCISES  Results after joint replacement surgery are often greatly improved when you follow the exercise, range of motion and muscle strengthening exercises prescribed by your doctor. Safety measures are also important to protect the joint from further injury. Any time any of these exercises cause you to have increased pain or swelling, decrease what you are doing until you are comfortable again and then slowly increase them. If you have problems or questions, call your caregiver or physical therapist for advice.   Rehabilitation is important following a joint replacement. After just a few days of immobilization, the muscles of the leg can become weakened and shrink (atrophy).  These exercises are designed to build up the tone and strength of the thigh and leg muscles and to improve motion. Often times heat used for twenty to thirty minutes before working out will loosen up your tissues and help with improving the range of motion but do not use heat for the first two weeks following surgery (sometimes heat can increase post-operative swelling).   These exercises can be done on a training (exercise) mat, on the floor, on a table or on a bed. Use whatever works the best and is most comfortable for you.    Use music or television while you are exercising so that the exercises are a pleasant break in your day. This will make your life better with the exercises acting as a break in your routine that you can look forward to.   Perform all exercises about fifteen times, three times per day or as directed.  You should exercise both the operative leg and the other leg as well.  Exercises include:   Quad Sets -  Tighten up the muscle on the front of the thigh (Quad) and hold for 5-10 seconds.   Straight Leg Raises - With your knee straight (if you were given a brace, keep it on), lift the leg to 60 degrees, hold for 3 seconds, and slowly lower the leg.  Perform this exercise against resistance later as your leg gets stronger.  Leg Slides: Lying on your back, slowly slide your foot toward your buttocks, bending your knee up off the floor (only go as far as is comfortable). Then slowly slide your foot back down until your leg is flat on the floor again.  Angel Wings: Lying on your back spread your legs to the side as far apart as you can without causing discomfort.  Hamstring Strength:  Lying on your back, push your heel against the floor with your leg straight by tightening up the muscles of your buttocks.  Repeat, but this time bend your knee to a comfortable angle, and push your heel against the floor.  You may put a pillow under the heel to make it more comfortable if necessary.   A rehabilitation program following joint replacement surgery can speed recovery and prevent re-injury in the future due to weakened muscles. Contact your doctor or a physical therapist for more information on knee rehabilitation.    CONSTIPATION  Constipation is defined medically as fewer than three stools per week and severe constipation as less than one stool per week.  Even if you have a regular bowel pattern at home, your normal regimen is likely to be disrupted due to multiple reasons following surgery.  Combination of anesthesia, postoperative narcotics, change in appetite and fluid intake all can affect your bowels.  YOU MUST use at least one of the following options; they are listed in order of increasing strength to get the job done.  They are all available over the counter, and you may need to use some, POSSIBLY even all of these options:    Drink plenty of fluids (prune juice may be helpful) and high fiber  foods Colace 100 mg by mouth twice a day  Senokot for constipation as directed and as needed Dulcolax (bisacodyl), take with full glass of water  Miralax (polyethylene glycol) once or twice a day as needed.  If you have tried all these things and are unable to have a bowel movement in the first 3-4 days after surgery call either your surgeon or your primary doctor.    If you experience loose stools or diarrhea, hold the medications until you stool forms back up.  If your symptoms do not get better within 1 week or if they get worse, check with your doctor.  If you experience "the worst abdominal pain ever" or develop nausea or vomiting, please contact the office immediately for further recommendations for treatment.   ITCHING:  If you experience itching with your medications, try taking only a single pain pill, or even half a pain pill at a time.  You can also use Benadryl over the counter for itching or also to help with sleep.   TED HOSE STOCKINGS:  Use stockings on both legs until for at least 2 weeks or as directed by physician office. They may be removed at night for sleeping.  MEDICATIONS:  See your medication summary on the "After Visit Summary" that nursing will review with you.  You may have some home medications which will be placed on hold until you complete the course of blood thinner medication.  It is important for you to complete the blood thinner medication as prescribed.  PRECAUTIONS:  If you experience chest pain or shortness of breath - call 911 immediately for transfer to the hospital emergency department.   If you develop a fever greater that 101 F, purulent drainage from wound, increased redness or drainage from wound, foul odor from the wound/dressing, or calf pain - CONTACT YOUR SURGEON.                                                   FOLLOW-UP APPOINTMENTS:  If you do not already have a post-op appointment, please call the office for an appointment to be seen by your  surgeon.  Guidelines for how soon to be seen are listed in your "After Visit Summary", but are typically between 1-4 weeks after surgery.  OTHER INSTRUCTIONS:   Knee Replacement:  Do not place pillow under knee, focus on keeping the knee straight while resting. CPM instructions: 0-90 degrees, 2 hours in the morning, 2 hours in the afternoon, and 2 hours in the evening. Place foam block, curve side up under heel at all times except when in CPM or when walking.  DO NOT modify, tear, cut, or change the foam block in any way.  POST-OPERATIVE OPIOID TAPER INSTRUCTIONS: It is important to wean off of your opioid medication as soon as possible. If you do not need pain medication after your surgery it is ok to stop day one. Opioids include: Codeine, Hydrocodone(Norco, Vicodin), Oxycodone(Percocet, oxycontin) and hydromorphone amongst others.  Long term and  even short term use of opiods can cause: Increased pain response Dependence Constipation Depression Respiratory depression And more.  Withdrawal symptoms can include Flu like symptoms Nausea, vomiting And more Techniques to manage these symptoms Hydrate well Eat regular healthy meals Stay active Use relaxation techniques(deep breathing, meditating, yoga) Do Not substitute Alcohol to help with tapering If you have been on opioids for less than two weeks and do not have pain than it is ok to stop all together.  Plan to wean off of opioids This plan should start within one week post op of your joint replacement. Maintain the same interval or time between taking each dose and first decrease the dose.  Cut the total daily intake of opioids by one tablet each day Next start to increase the time between doses. The last dose that should be eliminated is the evening dose.     MAKE SURE YOU:  Understand these instructions.  Get help right away if you are not doing well or get worse.    Thank you for letting us be a part of your medical care  team.  It is a privilege we respect greatly.  We hope these instructions will help you stay on track for a fast and full recovery!   Increase activity slowly as tolerated   Complete by: As directed    Post-operative opioid taper instructions:   Complete by: As directed    POST-OPERATIVE OPIOID TAPER INSTRUCTIONS: It is important to wean off of your opioid medication as soon as possible. If you do not need pain medication after your surgery it is ok to stop day one. Opioids include: Codeine, Hydrocodone(Norco, Vicodin), Oxycodone(Percocet, oxycontin) and hydromorphone amongst others.  Long term and even short term use of opiods can cause: Increased pain response Dependence Constipation Depression Respiratory depression And more.  Withdrawal symptoms can include Flu like symptoms Nausea, vomiting And more Techniques to manage these symptoms Hydrate well Eat regular healthy meals Stay active Use relaxation techniques(deep breathing, meditating, yoga) Do Not substitute Alcohol to help with tapering If you have been on opioids for less than two weeks and do not have pain than it is ok to stop all together.  Plan to wean off of opioids This plan should start within one week post op of your joint replacement. Maintain the same interval or time between taking each dose and first decrease the dose.  Cut the total daily intake of opioids by one tablet each day Next start to increase the time between doses. The last dose that should be eliminated is the evening dose.          DISCHARGE MEDICATIONS:   Allergies as of 10/28/2022       Reactions   Pravachol [pravastatin Sodium] Other (See Comments)   Muscles  aches   Augmentin [amoxicillin-pot Clavulanate] Diarrhea   Benadryl Allergy [diphenhydramine Hcl] Other (See Comments)   leg jerking/restless legs   Codeine Nausea Only   Patient tolerates percocet (oxycodone-acetaminophen) with no issue        Medication List     TAKE  these medications    aspirin 81 MG chewable tablet Chew 1 tablet (81 mg total) by mouth 2 (two) times daily.   atorvastatin 10 MG tablet Commonly known as: LIPITOR Take 10 mg by mouth every evening.   B-12 PO Take 1 tablet by mouth daily.   baclofen 10 MG tablet Commonly known as: LIORESAL Take 10 mg by mouth 2 (two) times daily as needed for muscle  spasms.   beta carotene w/minerals tablet Take 1 tablet by mouth in the morning.   clonazePAM 1 MG tablet Commonly known as: KLONOPIN Take 1 mg by mouth at bedtime.   diclofenac Sodium 1 % Gel Commonly known as: VOLTAREN Apply 2 g topically 4 (four) times daily as needed (pain.).   docusate sodium 100 MG capsule Commonly known as: COLACE Take 100 mg by mouth daily as needed for mild constipation.   furosemide 20 MG tablet Commonly known as: LASIX Take 20 mg by mouth in the morning.   Goodys Extra Strength 520-260-32.5 MG Pack Generic drug: Aspirin-Acetaminophen-Caffeine Take 1 packet by mouth daily as needed (severe headache).   HYDROcodone-acetaminophen 10-325 MG tablet Commonly known as: NORCO Take 1 tablet by mouth in the morning, at noon, in the evening, and at bedtime.   ibuprofen 800 MG tablet Commonly known as: ADVIL Take 1 tablet (800 mg total) by mouth every 8 (eight) hours as needed. What changed: when to take this   methocarbamol 500 MG tablet Commonly known as: ROBAXIN Take 1-2 tablets (500-1,000 mg total) by mouth every 6 (six) hours as needed for muscle spasms.   oxyCODONE 5 MG immediate release tablet Commonly known as: Oxy IR/ROXICODONE Take 1-2 tablets (5-10 mg total) by mouth every 6 (six) hours as needed for severe pain or breakthrough pain (pain score 7-10).   pregabalin 100 MG capsule Commonly known as: LYRICA Take 100 mg by mouth at bedtime.   promethazine 25 MG tablet Commonly known as: PHENERGAN Take 25 mg by mouth every 6 (six) hours as needed for vomiting or nausea.   tiZANidine 4  MG tablet Commonly known as: ZANAFLEX Take 4 mg by mouth in the morning and at bedtime.   urea 40 % Crea Commonly known as: CARMOL Apply 1 Application topically daily.   venlafaxine XR 150 MG 24 hr capsule Commonly known as: EFFEXOR-XR Take 150 mg by mouth daily.   venlafaxine XR 75 MG 24 hr capsule Commonly known as: EFFEXOR-XR Take 75 mg by mouth daily.   VITAMIN C PO Take 1 tablet by mouth daily.   Zetia 10 MG tablet Generic drug: ezetimibe Take 10 mg by mouth in the morning.               Durable Medical Equipment  (From admission, onward)           Start     Ordered   10/27/22 1043  DME Walker rolling  Once       Question:  Patient needs a walker to treat with the following condition  Answer:  S/P total knee replacement   10/27/22 1042   10/27/22 1043  DME 3 n 1  Once        10/27/22 1042   10/27/22 1043  DME Bedside commode  Once       Question:  Patient needs a bedside commode to treat with the following condition  Answer:  S/P total knee replacement   10/27/22 1042            FOLLOW UP VISIT:    DISPOSITION: HOME VS. SNF  Dental Antibiotics:  In most cases prophylactic antibiotics for Dental procdeures after total joint surgery are not necessary.  Exceptions are as follows:  1. History of prior total joint infection  2. Severely immunocompromised (Organ Transplant, cancer chemotherapy, Rheumatoid biologic meds such as Colony)  3. Poorly controlled diabetes (A1C &gt; 8.0, blood glucose over 200)  If you have one of  these conditions, contact your surgeon for an antibiotic prescription, prior to your dental procedure.   CONDITION:  Good   Donia Ast 10/28/2022, 12:45 PM

## 2022-10-29 ENCOUNTER — Encounter (HOSPITAL_COMMUNITY): Payer: Self-pay | Admitting: Orthopedic Surgery

## 2022-10-31 DIAGNOSIS — G8191 Hemiplegia, unspecified affecting right dominant side: Secondary | ICD-10-CM | POA: Diagnosis not present

## 2022-10-31 DIAGNOSIS — I6523 Occlusion and stenosis of bilateral carotid arteries: Secondary | ICD-10-CM | POA: Diagnosis not present

## 2022-10-31 DIAGNOSIS — I63232 Cerebral infarction due to unspecified occlusion or stenosis of left carotid arteries: Secondary | ICD-10-CM

## 2022-10-31 DIAGNOSIS — R4701 Aphasia: Secondary | ICD-10-CM | POA: Diagnosis not present

## 2022-10-31 DIAGNOSIS — G936 Cerebral edema: Secondary | ICD-10-CM | POA: Diagnosis not present

## 2022-10-31 DIAGNOSIS — R531 Weakness: Secondary | ICD-10-CM | POA: Diagnosis not present

## 2022-10-31 DIAGNOSIS — G969 Disorder of central nervous system, unspecified: Secondary | ICD-10-CM | POA: Diagnosis not present

## 2022-10-31 DIAGNOSIS — I63512 Cerebral infarction due to unspecified occlusion or stenosis of left middle cerebral artery: Secondary | ICD-10-CM | POA: Diagnosis not present

## 2022-10-31 DIAGNOSIS — I679 Cerebrovascular disease, unspecified: Secondary | ICD-10-CM | POA: Diagnosis not present

## 2022-10-31 DIAGNOSIS — R29818 Other symptoms and signs involving the nervous system: Secondary | ICD-10-CM | POA: Diagnosis not present

## 2022-10-31 DIAGNOSIS — M797 Fibromyalgia: Secondary | ICD-10-CM | POA: Diagnosis not present

## 2022-10-31 DIAGNOSIS — R29716 NIHSS score 16: Secondary | ICD-10-CM | POA: Diagnosis not present

## 2022-10-31 DIAGNOSIS — Z87442 Personal history of urinary calculi: Secondary | ICD-10-CM | POA: Diagnosis not present

## 2022-10-31 DIAGNOSIS — F32A Depression, unspecified: Secondary | ICD-10-CM | POA: Diagnosis not present

## 2022-10-31 DIAGNOSIS — Z7982 Long term (current) use of aspirin: Secondary | ICD-10-CM | POA: Diagnosis not present

## 2022-10-31 DIAGNOSIS — G2581 Restless legs syndrome: Secondary | ICD-10-CM | POA: Diagnosis not present

## 2022-10-31 DIAGNOSIS — I4891 Unspecified atrial fibrillation: Secondary | ICD-10-CM | POA: Diagnosis not present

## 2022-10-31 DIAGNOSIS — N200 Calculus of kidney: Secondary | ICD-10-CM | POA: Diagnosis not present

## 2022-10-31 DIAGNOSIS — E876 Hypokalemia: Secondary | ICD-10-CM | POA: Diagnosis not present

## 2022-10-31 DIAGNOSIS — R29720 NIHSS score 20: Secondary | ICD-10-CM | POA: Diagnosis not present

## 2022-10-31 DIAGNOSIS — G459 Transient cerebral ischemic attack, unspecified: Secondary | ICD-10-CM | POA: Diagnosis not present

## 2022-10-31 DIAGNOSIS — Z96653 Presence of artificial knee joint, bilateral: Secondary | ICD-10-CM | POA: Diagnosis not present

## 2022-10-31 DIAGNOSIS — E785 Hyperlipidemia, unspecified: Secondary | ICD-10-CM | POA: Diagnosis not present

## 2022-10-31 DIAGNOSIS — R29810 Facial weakness: Secondary | ICD-10-CM | POA: Diagnosis not present

## 2022-10-31 DIAGNOSIS — I639 Cerebral infarction, unspecified: Secondary | ICD-10-CM | POA: Diagnosis not present

## 2022-10-31 DIAGNOSIS — F17200 Nicotine dependence, unspecified, uncomplicated: Secondary | ICD-10-CM | POA: Diagnosis not present

## 2022-10-31 HISTORY — DX: Cerebral infarction due to unspecified occlusion or stenosis of left carotid arteries: I63.232

## 2022-11-01 DIAGNOSIS — G936 Cerebral edema: Secondary | ICD-10-CM | POA: Insufficient documentation

## 2022-11-01 HISTORY — DX: Cerebral edema: G93.6

## 2022-11-02 DIAGNOSIS — E876 Hypokalemia: Secondary | ICD-10-CM | POA: Diagnosis not present

## 2022-11-02 DIAGNOSIS — G936 Cerebral edema: Secondary | ICD-10-CM | POA: Diagnosis not present

## 2022-11-02 DIAGNOSIS — M1990 Unspecified osteoarthritis, unspecified site: Secondary | ICD-10-CM | POA: Diagnosis not present

## 2022-11-02 DIAGNOSIS — R9089 Other abnormal findings on diagnostic imaging of central nervous system: Secondary | ICD-10-CM | POA: Diagnosis not present

## 2022-11-02 DIAGNOSIS — R509 Fever, unspecified: Secondary | ICD-10-CM | POA: Diagnosis not present

## 2022-11-02 DIAGNOSIS — I499 Cardiac arrhythmia, unspecified: Secondary | ICD-10-CM | POA: Diagnosis not present

## 2022-11-02 DIAGNOSIS — G8929 Other chronic pain: Secondary | ICD-10-CM | POA: Diagnosis not present

## 2022-11-02 DIAGNOSIS — R1310 Dysphagia, unspecified: Secondary | ICD-10-CM | POA: Diagnosis not present

## 2022-11-02 DIAGNOSIS — M545 Low back pain, unspecified: Secondary | ICD-10-CM | POA: Diagnosis not present

## 2022-11-02 DIAGNOSIS — R4701 Aphasia: Secondary | ICD-10-CM | POA: Diagnosis not present

## 2022-11-02 DIAGNOSIS — E785 Hyperlipidemia, unspecified: Secondary | ICD-10-CM | POA: Diagnosis not present

## 2022-11-02 DIAGNOSIS — I69320 Aphasia following cerebral infarction: Secondary | ICD-10-CM | POA: Diagnosis not present

## 2022-11-02 DIAGNOSIS — R7303 Prediabetes: Secondary | ICD-10-CM | POA: Diagnosis not present

## 2022-11-02 DIAGNOSIS — G2581 Restless legs syndrome: Secondary | ICD-10-CM | POA: Diagnosis not present

## 2022-11-02 DIAGNOSIS — Z7982 Long term (current) use of aspirin: Secondary | ICD-10-CM | POA: Diagnosis not present

## 2022-11-02 DIAGNOSIS — I4891 Unspecified atrial fibrillation: Secondary | ICD-10-CM | POA: Diagnosis not present

## 2022-11-02 DIAGNOSIS — M797 Fibromyalgia: Secondary | ICD-10-CM | POA: Diagnosis not present

## 2022-11-02 DIAGNOSIS — I491 Atrial premature depolarization: Secondary | ICD-10-CM | POA: Diagnosis not present

## 2022-11-02 DIAGNOSIS — R29713 NIHSS score 13: Secondary | ICD-10-CM | POA: Diagnosis not present

## 2022-11-02 DIAGNOSIS — I63512 Cerebral infarction due to unspecified occlusion or stenosis of left middle cerebral artery: Secondary | ICD-10-CM | POA: Diagnosis not present

## 2022-11-02 DIAGNOSIS — E0590 Thyrotoxicosis, unspecified without thyrotoxic crisis or storm: Secondary | ICD-10-CM | POA: Diagnosis not present

## 2022-11-02 DIAGNOSIS — R7989 Other specified abnormal findings of blood chemistry: Secondary | ICD-10-CM | POA: Diagnosis not present

## 2022-11-02 DIAGNOSIS — R197 Diarrhea, unspecified: Secondary | ICD-10-CM | POA: Diagnosis not present

## 2022-11-02 DIAGNOSIS — I482 Chronic atrial fibrillation, unspecified: Secondary | ICD-10-CM | POA: Diagnosis not present

## 2022-11-02 DIAGNOSIS — R29810 Facial weakness: Secondary | ICD-10-CM | POA: Diagnosis not present

## 2022-11-02 DIAGNOSIS — G935 Compression of brain: Secondary | ICD-10-CM | POA: Diagnosis not present

## 2022-11-02 DIAGNOSIS — R531 Weakness: Secondary | ICD-10-CM | POA: Diagnosis not present

## 2022-11-02 DIAGNOSIS — G43909 Migraine, unspecified, not intractable, without status migrainosus: Secondary | ICD-10-CM | POA: Diagnosis not present

## 2022-11-02 DIAGNOSIS — I6389 Other cerebral infarction: Secondary | ICD-10-CM | POA: Diagnosis not present

## 2022-11-02 DIAGNOSIS — I63232 Cerebral infarction due to unspecified occlusion or stenosis of left carotid arteries: Secondary | ICD-10-CM | POA: Diagnosis not present

## 2022-11-05 DIAGNOSIS — I69351 Hemiplegia and hemiparesis following cerebral infarction affecting right dominant side: Secondary | ICD-10-CM | POA: Diagnosis not present

## 2022-11-05 DIAGNOSIS — Z960 Presence of urogenital implants: Secondary | ICD-10-CM | POA: Diagnosis not present

## 2022-11-05 DIAGNOSIS — I69392 Facial weakness following cerebral infarction: Secondary | ICD-10-CM | POA: Diagnosis not present

## 2022-11-05 DIAGNOSIS — R197 Diarrhea, unspecified: Secondary | ICD-10-CM | POA: Diagnosis not present

## 2022-11-05 DIAGNOSIS — M797 Fibromyalgia: Secondary | ICD-10-CM | POA: Diagnosis not present

## 2022-11-05 DIAGNOSIS — R2689 Other abnormalities of gait and mobility: Secondary | ICD-10-CM | POA: Diagnosis not present

## 2022-11-05 DIAGNOSIS — Z9181 History of falling: Secondary | ICD-10-CM | POA: Diagnosis not present

## 2022-11-05 DIAGNOSIS — R9089 Other abnormal findings on diagnostic imaging of central nervous system: Secondary | ICD-10-CM | POA: Diagnosis not present

## 2022-11-05 DIAGNOSIS — Z96653 Presence of artificial knee joint, bilateral: Secondary | ICD-10-CM | POA: Diagnosis not present

## 2022-11-05 DIAGNOSIS — I63512 Cerebral infarction due to unspecified occlusion or stenosis of left middle cerebral artery: Secondary | ICD-10-CM

## 2022-11-05 DIAGNOSIS — G2581 Restless legs syndrome: Secondary | ICD-10-CM | POA: Diagnosis not present

## 2022-11-05 DIAGNOSIS — R1312 Dysphagia, oropharyngeal phase: Secondary | ICD-10-CM | POA: Diagnosis not present

## 2022-11-05 DIAGNOSIS — I63232 Cerebral infarction due to unspecified occlusion or stenosis of left carotid arteries: Secondary | ICD-10-CM | POA: Diagnosis not present

## 2022-11-05 DIAGNOSIS — I951 Orthostatic hypotension: Secondary | ICD-10-CM | POA: Diagnosis not present

## 2022-11-05 DIAGNOSIS — R633 Feeding difficulties, unspecified: Secondary | ICD-10-CM | POA: Diagnosis not present

## 2022-11-05 DIAGNOSIS — E785 Hyperlipidemia, unspecified: Secondary | ICD-10-CM | POA: Diagnosis not present

## 2022-11-05 DIAGNOSIS — I69320 Aphasia following cerebral infarction: Secondary | ICD-10-CM | POA: Diagnosis not present

## 2022-11-05 DIAGNOSIS — R531 Weakness: Secondary | ICD-10-CM | POA: Diagnosis not present

## 2022-11-05 DIAGNOSIS — R339 Retention of urine, unspecified: Secondary | ICD-10-CM | POA: Diagnosis not present

## 2022-11-05 DIAGNOSIS — E0590 Thyrotoxicosis, unspecified without thyrotoxic crisis or storm: Secondary | ICD-10-CM | POA: Diagnosis not present

## 2022-11-05 DIAGNOSIS — I69391 Dysphagia following cerebral infarction: Secondary | ICD-10-CM | POA: Diagnosis not present

## 2022-11-05 DIAGNOSIS — I4891 Unspecified atrial fibrillation: Secondary | ICD-10-CM | POA: Diagnosis not present

## 2022-11-05 DIAGNOSIS — D539 Nutritional anemia, unspecified: Secondary | ICD-10-CM | POA: Diagnosis not present

## 2022-11-05 HISTORY — DX: Cerebral infarction due to unspecified occlusion or stenosis of left middle cerebral artery: I63.512

## 2022-11-06 DIAGNOSIS — I63512 Cerebral infarction due to unspecified occlusion or stenosis of left middle cerebral artery: Secondary | ICD-10-CM | POA: Diagnosis not present

## 2022-11-10 DIAGNOSIS — I63512 Cerebral infarction due to unspecified occlusion or stenosis of left middle cerebral artery: Secondary | ICD-10-CM | POA: Diagnosis not present

## 2022-11-12 DIAGNOSIS — I63512 Cerebral infarction due to unspecified occlusion or stenosis of left middle cerebral artery: Secondary | ICD-10-CM | POA: Diagnosis not present

## 2022-11-12 DIAGNOSIS — R1312 Dysphagia, oropharyngeal phase: Secondary | ICD-10-CM | POA: Diagnosis not present

## 2022-11-12 DIAGNOSIS — R633 Feeding difficulties, unspecified: Secondary | ICD-10-CM | POA: Diagnosis not present

## 2022-11-13 DIAGNOSIS — I63512 Cerebral infarction due to unspecified occlusion or stenosis of left middle cerebral artery: Secondary | ICD-10-CM | POA: Diagnosis not present

## 2022-11-14 DIAGNOSIS — I63512 Cerebral infarction due to unspecified occlusion or stenosis of left middle cerebral artery: Secondary | ICD-10-CM | POA: Diagnosis not present

## 2022-11-19 DIAGNOSIS — I63512 Cerebral infarction due to unspecified occlusion or stenosis of left middle cerebral artery: Secondary | ICD-10-CM | POA: Diagnosis not present

## 2022-11-24 DIAGNOSIS — D539 Nutritional anemia, unspecified: Secondary | ICD-10-CM

## 2022-11-24 DIAGNOSIS — E0590 Thyrotoxicosis, unspecified without thyrotoxic crisis or storm: Secondary | ICD-10-CM | POA: Insufficient documentation

## 2022-11-24 DIAGNOSIS — I639 Cerebral infarction, unspecified: Secondary | ICD-10-CM

## 2022-11-24 DIAGNOSIS — R4701 Aphasia: Secondary | ICD-10-CM

## 2022-11-24 HISTORY — DX: Aphasia: R47.01

## 2022-11-24 HISTORY — DX: Nutritional anemia, unspecified: D53.9

## 2022-11-24 HISTORY — DX: Cerebral infarction, unspecified: I63.9

## 2022-11-24 HISTORY — DX: Thyrotoxicosis, unspecified without thyrotoxic crisis or storm: E05.90

## 2022-12-02 DIAGNOSIS — F5101 Primary insomnia: Secondary | ICD-10-CM | POA: Diagnosis not present

## 2022-12-02 DIAGNOSIS — I700 Atherosclerosis of aorta: Secondary | ICD-10-CM | POA: Diagnosis not present

## 2022-12-02 DIAGNOSIS — Z8673 Personal history of transient ischemic attack (TIA), and cerebral infarction without residual deficits: Secondary | ICD-10-CM | POA: Diagnosis not present

## 2022-12-02 DIAGNOSIS — I480 Paroxysmal atrial fibrillation: Secondary | ICD-10-CM | POA: Diagnosis not present

## 2022-12-02 DIAGNOSIS — D6859 Other primary thrombophilia: Secondary | ICD-10-CM | POA: Diagnosis not present

## 2022-12-02 DIAGNOSIS — M47816 Spondylosis without myelopathy or radiculopathy, lumbar region: Secondary | ICD-10-CM | POA: Diagnosis not present

## 2022-12-02 DIAGNOSIS — Z6823 Body mass index (BMI) 23.0-23.9, adult: Secondary | ICD-10-CM | POA: Diagnosis not present

## 2022-12-08 DIAGNOSIS — R278 Other lack of coordination: Secondary | ICD-10-CM | POA: Diagnosis not present

## 2022-12-08 DIAGNOSIS — M25661 Stiffness of right knee, not elsewhere classified: Secondary | ICD-10-CM | POA: Diagnosis not present

## 2022-12-08 DIAGNOSIS — M6281 Muscle weakness (generalized): Secondary | ICD-10-CM | POA: Diagnosis not present

## 2022-12-08 DIAGNOSIS — H538 Other visual disturbances: Secondary | ICD-10-CM | POA: Diagnosis not present

## 2022-12-08 DIAGNOSIS — Z8673 Personal history of transient ischemic attack (TIA), and cerebral infarction without residual deficits: Secondary | ICD-10-CM | POA: Diagnosis not present

## 2022-12-08 DIAGNOSIS — R2681 Unsteadiness on feet: Secondary | ICD-10-CM | POA: Diagnosis not present

## 2022-12-08 DIAGNOSIS — M25561 Pain in right knee: Secondary | ICD-10-CM | POA: Diagnosis not present

## 2022-12-08 DIAGNOSIS — R4701 Aphasia: Secondary | ICD-10-CM | POA: Diagnosis not present

## 2022-12-08 DIAGNOSIS — R482 Apraxia: Secondary | ICD-10-CM | POA: Diagnosis not present

## 2022-12-15 DIAGNOSIS — M25661 Stiffness of right knee, not elsewhere classified: Secondary | ICD-10-CM | POA: Diagnosis not present

## 2022-12-15 DIAGNOSIS — H538 Other visual disturbances: Secondary | ICD-10-CM | POA: Diagnosis not present

## 2022-12-15 DIAGNOSIS — R2681 Unsteadiness on feet: Secondary | ICD-10-CM | POA: Diagnosis not present

## 2022-12-15 DIAGNOSIS — R278 Other lack of coordination: Secondary | ICD-10-CM | POA: Diagnosis not present

## 2022-12-15 DIAGNOSIS — Z8673 Personal history of transient ischemic attack (TIA), and cerebral infarction without residual deficits: Secondary | ICD-10-CM | POA: Diagnosis not present

## 2022-12-15 DIAGNOSIS — M6281 Muscle weakness (generalized): Secondary | ICD-10-CM | POA: Diagnosis not present

## 2022-12-15 DIAGNOSIS — M25561 Pain in right knee: Secondary | ICD-10-CM | POA: Diagnosis not present

## 2022-12-15 DIAGNOSIS — R4701 Aphasia: Secondary | ICD-10-CM | POA: Diagnosis not present

## 2022-12-15 DIAGNOSIS — R482 Apraxia: Secondary | ICD-10-CM | POA: Diagnosis not present

## 2022-12-16 DIAGNOSIS — Z96651 Presence of right artificial knee joint: Secondary | ICD-10-CM | POA: Diagnosis not present

## 2022-12-17 DIAGNOSIS — F32A Depression, unspecified: Secondary | ICD-10-CM | POA: Insufficient documentation

## 2022-12-17 DIAGNOSIS — F419 Anxiety disorder, unspecified: Secondary | ICD-10-CM | POA: Insufficient documentation

## 2022-12-23 DIAGNOSIS — M25561 Pain in right knee: Secondary | ICD-10-CM | POA: Diagnosis not present

## 2022-12-23 DIAGNOSIS — R482 Apraxia: Secondary | ICD-10-CM | POA: Diagnosis not present

## 2022-12-23 DIAGNOSIS — Z8673 Personal history of transient ischemic attack (TIA), and cerebral infarction without residual deficits: Secondary | ICD-10-CM | POA: Diagnosis not present

## 2022-12-23 DIAGNOSIS — M6281 Muscle weakness (generalized): Secondary | ICD-10-CM | POA: Diagnosis not present

## 2022-12-23 DIAGNOSIS — H538 Other visual disturbances: Secondary | ICD-10-CM | POA: Diagnosis not present

## 2022-12-23 DIAGNOSIS — M25661 Stiffness of right knee, not elsewhere classified: Secondary | ICD-10-CM | POA: Diagnosis not present

## 2022-12-23 DIAGNOSIS — R278 Other lack of coordination: Secondary | ICD-10-CM | POA: Diagnosis not present

## 2022-12-23 DIAGNOSIS — R4701 Aphasia: Secondary | ICD-10-CM | POA: Diagnosis not present

## 2022-12-23 DIAGNOSIS — R2681 Unsteadiness on feet: Secondary | ICD-10-CM | POA: Diagnosis not present

## 2022-12-24 NOTE — Progress Notes (Deleted)
Cardiology Office Note:    Date:  12/24/2022   ID:  Sonya Allen, DOB [birth year], MRN [id number]  PCP:  Sonya Kingdom, MD   [facility] Providers Cardiologist:  None { Click to update primary MD,subspecialty MD or APP then REFRESH:1}    Referring MD: Sonya Kingdom, MD   No chief complaint on file. ***  History of Present Illness:    Sonya Allen is a 67 y.o. female with a hx of left MCA stroke, fibromyalgia, migraines, hypercholesteremia, anxiety, depression.  She initially established with Dr. Dulce Sellar in 2022 after being evaluated at [facility] ED with a fall and scalp laceration.  Her CBC at that time showed a hemoglobin of 12.8, BMP showed a creatinine 0.60, potassium 4.1, EKG showed sinus rhythm with right atrial enlargement.  She had a CTA of her chest which showed normal cardiovascular structures.  She was referred to cardiology for her abnormal EKG.  An echo was obtained on 03/19/2021 which revealed an EF of 60 to 65%, no valvular abnormalities.  She underwent a TKR in 2024, 5 days after this procedure she suffered a stroke.  She was admitted to [facility] on 11/02/2022 for left MCA stroke, felt to be cardioembolic in the setting of atrial fibrillation.  She was started on Eliquis 5 mg twice daily indefinitely for secondary stroke prevention.  She remained in ICU for some time as there was concern that she would need a hemicraniectomy however she was stable from a mental and neurological standpoint and this was not required.  She had a TTE with bubble study on 11/02/2022 which showed no right to left shunt, EF of 55 to 60%, no significant valvular stenosis or regurgitation.    Past Medical History:  Diagnosis Date   Acute ischemic left MCA stroke (HCC) 11/05/2022   Acute pain of left shoulder 08/13/2017   Anxiety    Arthritis    Arthritis of carpometacarpal (CMC) joint of left thumb 06/21/2020   Bilateral dry eyes    Takes Systane Drops    Bronchitis    hx of   Cerebral edema (HCC) 11/01/2022   Cerebrovascular accident (CVA) due to occlusion of left carotid artery (HCC) 10/31/2022   Chronic low back pain 04/15/2013   Constipation due to pain medication    Depression    Edema of extremities    Takes Lasix   Fibromyalgia    History of kidney stones    Hypercholesteremia    Knee pain, right 01/23/2014   Left carpal tunnel syndrome 06/21/2020   Lumbar radiculopathy 04/15/2013   Lumbosacral radiculitis 09/23/2013   Other specified postprocedural states 01/31/2013   Pain in thoracic spine 04/15/2013   PONV (postoperative nausea and vomiting)    PONV (postoperative nausea and vomiting) 02/07/2021   Prolapse of female pelvic organs 05/19/2017   Recurrent right knee instability 12/31/2017   Restless leg syndrome    Takes Klonopin   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   S/P total knee replacement 10/27/2022   Unilateral primary osteoarthritis, right knee 05/26/2013    Past Surgical History:  Procedure Laterality Date   ABDOMINAL HYSTERECTOMY     ANKLE FUSION Right    BACK SURGERY     x 6 lumbar & cervical   CARPAL TUNNEL RELEASE Bilateral    KNEE ARTHROSCOPY Bilateral    ROTATOR CUFF REPAIR Right    SPINAL CORD STIMULATOR REMOVAL N/A 07/29/2022   Procedure: REMOVAL OF SPINAL CORD STIMULATOR AND BATTERY;  Surgeon: Monia Pouch  C, DO;  Location: [facility] OR;  Service: Neurosurgery;  Laterality: N/A;  3C   TOTAL KNEE ARTHROPLASTY Left 07/25/2013   Procedure: TOTAL KNEE ARTHROPLASTY;  Surgeon: Dannielle Huh, MD;  Location: [facility] OR;  Service: Orthopedics;  Laterality: Left;   TOTAL KNEE ARTHROPLASTY Right 10/27/2022   Procedure: TOTAL KNEE ARTHROPLASTY;  Surgeon: Dannielle Huh, MD;  Location: [facility] ORS;  Service: Orthopedics;  Laterality: Right;    Current Medications: No outpatient medications have been marked as taking for the 12/25/22 encounter (Appointment) with Flossie Dibble, NP.     Allergies:   Pravachol [pravastatin  sodium], Augmentin [amoxicillin-pot clavulanate], Benadryl allergy [diphenhydramine hcl], and Codeine   Social History   Socioeconomic History   Marital status: Married    Spouse name: Not on file   Number of children: 3   Years of education: Not on file   Highest education level: Not on file  Occupational History   Not on file  Tobacco Use   Smoking status: Some Days    Packs/day: 0.25    Years: 29.00    Additional pack years: 0.00    Total pack years: 7.25    Types: Cigarettes   Smokeless tobacco: Never  Vaping Use   Vaping Use: Never used  Substance and Sexual Activity   Alcohol use: No   Drug use: Not Currently   Sexual activity: Not on file  Other Topics Concern   Not on file  Social History Narrative   Not on file   Social Determinants of Health   Financial Resource Strain: Not on file  Food Insecurity: No Food Insecurity (10/27/2022)   Hunger Vital Sign    Worried About Running Out of Food in the Last Year: Never true    Ran Out of Food in the Last Year: Never true  Transportation Needs: No Transportation Needs (10/27/2022)   PRAPARE - Administrator, Civil Service (Medical): No    Lack of Transportation (Non-Medical): No  Physical Activity: Not on file  Stress: Not on file  Social Connections: Not on file     Family History: The patient's ***family history includes Cancer in her mother; Diabetes in her sister; Heart Problems in her father and sister.  ROS:   Please see the history of present illness.    *** All other systems reviewed and are negative.  EKGs/Labs/Other Studies Reviewed:    The following studies were reviewed today: ***  EKG:  EKG is *** ordered today.  The ekg ordered today demonstrates ***  Recent Labs: 10/27/2022: ALT 20; BUN 20; Creatinine, Ser 0.59; Hemoglobin 11.9; Platelets 185; Potassium 3.6; Sodium 141  Recent Lipid Panel No results found for: "CHOL", "TRIG", "HDL", "CHOLHDL", "VLDL", "LDLCALC", "LDLDIRECT"   Risk  Assessment/Calculations:   {Does this patient have ATRIAL FIBRILLATION?[phone]}  No BP recorded.  {Refresh Note OR Click here to enter BP  :1}***         Physical Exam:    VS:  There were no vitals taken for this visit.    Wt Readings from Last 3 Encounters:  10/27/22 156 lb (70.8 kg)  10/15/22 156 lb (70.8 kg)  07/29/22 165 lb (74.8 kg)     GEN: *** Well nourished, well developed in no acute distress HEENT: Normal NECK: No JVD; No carotid bruits LYMPHATICS: No lymphadenopathy CARDIAC: ***RRR, no murmurs, rubs, gallops RESPIRATORY:  Clear to auscultation without rales, wheezing or rhonchi  ABDOMEN: Soft, non-tender, non-distended MUSCULOSKELETAL:  No edema; No deformity  SKIN: Warm  and dry NEUROLOGIC:  Alert and oriented x 3 PSYCHIATRIC:  Normal affect   ASSESSMENT:    No diagnosis found. PLAN:    In order of problems listed above:  ***      {Are you ordering a CV Procedure (e.g. stress test, cath, DCCV, TEE, etc)?   Press F2        :[id number]}    Medication Adjustments/Labs and Tests Ordered: Current medicines are reviewed at length with the patient today.  Concerns regarding medicines are outlined above.  No orders of the defined types were placed in this encounter.  No orders of the defined types were placed in this encounter.   There are no Patient Instructions on file for this visit.   Signed, Flossie Dibble, NP  12/24/2022 3:49 PM    [facility]

## 2022-12-25 ENCOUNTER — Ambulatory Visit: Payer: Medicare Other | Attending: Cardiology | Admitting: Cardiology

## 2022-12-25 DIAGNOSIS — I63512 Cerebral infarction due to unspecified occlusion or stenosis of left middle cerebral artery: Secondary | ICD-10-CM

## 2022-12-25 DIAGNOSIS — M25561 Pain in right knee: Secondary | ICD-10-CM | POA: Diagnosis not present

## 2022-12-25 DIAGNOSIS — R278 Other lack of coordination: Secondary | ICD-10-CM | POA: Diagnosis not present

## 2022-12-25 DIAGNOSIS — R482 Apraxia: Secondary | ICD-10-CM | POA: Diagnosis not present

## 2022-12-25 DIAGNOSIS — R2681 Unsteadiness on feet: Secondary | ICD-10-CM | POA: Diagnosis not present

## 2022-12-25 DIAGNOSIS — H538 Other visual disturbances: Secondary | ICD-10-CM | POA: Diagnosis not present

## 2022-12-25 DIAGNOSIS — M6281 Muscle weakness (generalized): Secondary | ICD-10-CM | POA: Diagnosis not present

## 2022-12-25 DIAGNOSIS — M25661 Stiffness of right knee, not elsewhere classified: Secondary | ICD-10-CM | POA: Diagnosis not present

## 2022-12-30 DIAGNOSIS — R278 Other lack of coordination: Secondary | ICD-10-CM | POA: Diagnosis not present

## 2022-12-30 DIAGNOSIS — M6281 Muscle weakness (generalized): Secondary | ICD-10-CM | POA: Diagnosis not present

## 2022-12-30 DIAGNOSIS — R2681 Unsteadiness on feet: Secondary | ICD-10-CM | POA: Diagnosis not present

## 2022-12-30 DIAGNOSIS — M25661 Stiffness of right knee, not elsewhere classified: Secondary | ICD-10-CM | POA: Diagnosis not present

## 2022-12-30 DIAGNOSIS — M25561 Pain in right knee: Secondary | ICD-10-CM | POA: Diagnosis not present

## 2022-12-30 DIAGNOSIS — H538 Other visual disturbances: Secondary | ICD-10-CM | POA: Diagnosis not present

## 2022-12-30 DIAGNOSIS — R482 Apraxia: Secondary | ICD-10-CM | POA: Diagnosis not present

## 2022-12-31 ENCOUNTER — Encounter: Payer: Self-pay | Admitting: Cardiology

## 2023-01-01 DIAGNOSIS — M25661 Stiffness of right knee, not elsewhere classified: Secondary | ICD-10-CM | POA: Diagnosis not present

## 2023-01-01 DIAGNOSIS — R2681 Unsteadiness on feet: Secondary | ICD-10-CM | POA: Diagnosis not present

## 2023-01-01 DIAGNOSIS — R482 Apraxia: Secondary | ICD-10-CM | POA: Diagnosis not present

## 2023-01-01 DIAGNOSIS — R278 Other lack of coordination: Secondary | ICD-10-CM | POA: Diagnosis not present

## 2023-01-01 DIAGNOSIS — H538 Other visual disturbances: Secondary | ICD-10-CM | POA: Diagnosis not present

## 2023-01-01 DIAGNOSIS — M6281 Muscle weakness (generalized): Secondary | ICD-10-CM | POA: Diagnosis not present

## 2023-01-01 DIAGNOSIS — M25561 Pain in right knee: Secondary | ICD-10-CM | POA: Diagnosis not present

## 2023-01-06 DIAGNOSIS — R482 Apraxia: Secondary | ICD-10-CM | POA: Diagnosis not present

## 2023-01-06 DIAGNOSIS — M25661 Stiffness of right knee, not elsewhere classified: Secondary | ICD-10-CM | POA: Diagnosis not present

## 2023-01-06 DIAGNOSIS — H538 Other visual disturbances: Secondary | ICD-10-CM | POA: Diagnosis not present

## 2023-01-06 DIAGNOSIS — M25561 Pain in right knee: Secondary | ICD-10-CM | POA: Diagnosis not present

## 2023-01-06 DIAGNOSIS — M6281 Muscle weakness (generalized): Secondary | ICD-10-CM | POA: Diagnosis not present

## 2023-01-06 DIAGNOSIS — R2681 Unsteadiness on feet: Secondary | ICD-10-CM | POA: Diagnosis not present

## 2023-01-06 DIAGNOSIS — R278 Other lack of coordination: Secondary | ICD-10-CM | POA: Diagnosis not present

## 2023-01-08 DIAGNOSIS — R482 Apraxia: Secondary | ICD-10-CM | POA: Diagnosis not present

## 2023-01-08 DIAGNOSIS — R278 Other lack of coordination: Secondary | ICD-10-CM | POA: Diagnosis not present

## 2023-01-08 DIAGNOSIS — R2681 Unsteadiness on feet: Secondary | ICD-10-CM | POA: Diagnosis not present

## 2023-01-08 DIAGNOSIS — M25661 Stiffness of right knee, not elsewhere classified: Secondary | ICD-10-CM | POA: Diagnosis not present

## 2023-01-08 DIAGNOSIS — M6281 Muscle weakness (generalized): Secondary | ICD-10-CM | POA: Diagnosis not present

## 2023-01-08 DIAGNOSIS — H538 Other visual disturbances: Secondary | ICD-10-CM | POA: Diagnosis not present

## 2023-01-08 DIAGNOSIS — M25561 Pain in right knee: Secondary | ICD-10-CM | POA: Diagnosis not present

## 2023-01-13 DIAGNOSIS — M25661 Stiffness of right knee, not elsewhere classified: Secondary | ICD-10-CM | POA: Diagnosis not present

## 2023-01-13 DIAGNOSIS — M6281 Muscle weakness (generalized): Secondary | ICD-10-CM | POA: Diagnosis not present

## 2023-01-13 DIAGNOSIS — H538 Other visual disturbances: Secondary | ICD-10-CM | POA: Diagnosis not present

## 2023-01-13 DIAGNOSIS — R2681 Unsteadiness on feet: Secondary | ICD-10-CM | POA: Diagnosis not present

## 2023-01-13 DIAGNOSIS — R278 Other lack of coordination: Secondary | ICD-10-CM | POA: Diagnosis not present

## 2023-01-13 DIAGNOSIS — M25561 Pain in right knee: Secondary | ICD-10-CM | POA: Diagnosis not present

## 2023-01-13 DIAGNOSIS — R482 Apraxia: Secondary | ICD-10-CM | POA: Diagnosis not present

## 2023-01-15 DIAGNOSIS — R482 Apraxia: Secondary | ICD-10-CM | POA: Diagnosis not present

## 2023-01-15 DIAGNOSIS — H538 Other visual disturbances: Secondary | ICD-10-CM | POA: Diagnosis not present

## 2023-01-15 DIAGNOSIS — M25661 Stiffness of right knee, not elsewhere classified: Secondary | ICD-10-CM | POA: Diagnosis not present

## 2023-01-15 DIAGNOSIS — M25561 Pain in right knee: Secondary | ICD-10-CM | POA: Diagnosis not present

## 2023-01-15 DIAGNOSIS — R278 Other lack of coordination: Secondary | ICD-10-CM | POA: Diagnosis not present

## 2023-01-15 DIAGNOSIS — R2681 Unsteadiness on feet: Secondary | ICD-10-CM | POA: Diagnosis not present

## 2023-01-15 DIAGNOSIS — M6281 Muscle weakness (generalized): Secondary | ICD-10-CM | POA: Diagnosis not present

## 2023-01-20 DIAGNOSIS — M6281 Muscle weakness (generalized): Secondary | ICD-10-CM | POA: Diagnosis not present

## 2023-01-20 DIAGNOSIS — R2681 Unsteadiness on feet: Secondary | ICD-10-CM | POA: Diagnosis not present

## 2023-01-20 DIAGNOSIS — H538 Other visual disturbances: Secondary | ICD-10-CM | POA: Diagnosis not present

## 2023-01-20 DIAGNOSIS — M25561 Pain in right knee: Secondary | ICD-10-CM | POA: Diagnosis not present

## 2023-01-20 DIAGNOSIS — R482 Apraxia: Secondary | ICD-10-CM | POA: Diagnosis not present

## 2023-01-20 DIAGNOSIS — M25661 Stiffness of right knee, not elsewhere classified: Secondary | ICD-10-CM | POA: Diagnosis not present

## 2023-01-20 DIAGNOSIS — R278 Other lack of coordination: Secondary | ICD-10-CM | POA: Diagnosis not present

## 2023-01-22 DIAGNOSIS — R278 Other lack of coordination: Secondary | ICD-10-CM | POA: Diagnosis not present

## 2023-01-22 DIAGNOSIS — H538 Other visual disturbances: Secondary | ICD-10-CM | POA: Diagnosis not present

## 2023-01-22 DIAGNOSIS — M25661 Stiffness of right knee, not elsewhere classified: Secondary | ICD-10-CM | POA: Diagnosis not present

## 2023-01-22 DIAGNOSIS — M6281 Muscle weakness (generalized): Secondary | ICD-10-CM | POA: Diagnosis not present

## 2023-01-22 DIAGNOSIS — M25561 Pain in right knee: Secondary | ICD-10-CM | POA: Diagnosis not present

## 2023-01-22 DIAGNOSIS — R2681 Unsteadiness on feet: Secondary | ICD-10-CM | POA: Diagnosis not present

## 2023-01-22 DIAGNOSIS — R482 Apraxia: Secondary | ICD-10-CM | POA: Diagnosis not present

## 2023-01-26 NOTE — Progress Notes (Unsigned)
Cardiology Office Note:    Date:  01/27/2023   ID:  EMPERATRIZ MEDWID, DOB 1955-11-23, MRN [id number]  PCP:  Philemon Kingdom, MD   [facility] Providers Cardiologist:  None     Referring MD: Philemon Kingdom, MD   Chief Complaint  Patient presents with   Hospitalization Follow-up   History of Present Illness:    VAYA GHERARDI is a 67 y.o. female with a hx of stroke, PAF, migraines, fibromyalgia, hyperlipidemia, anxiety, depression  She establish care with Dr. Dulce Sellar on 02/20/2021 at the behest of her PCP for evaluation of abnormal EKG.  She had been evaluated at [facility] emergency department s/p fall, EKG at that time showed sinus rhythm with right atrial enlargement.  She had a CTA of her chest which was unrevealing.  Echocardiogram was arranged and completed on 03/18/2021 which revealed an EF of 60 to 65%, no valvular abnormalities.  She was admitted on 11/02/2022 to [facility] for left MCA stroke secondary to cardioembolism in the setting of atrial fibrillation.  She was discharged on aspirin 81 mg daily until 11/07/2022, Eliquis 5 mg twice daily was started indefinitely for secondary stroke prevention.  She was ultimately discharged to inpatient rehab on 11/05/2022.  She presents today accompanied by her husband after hospitalization as outlined above.  She is doing well from a cardiac perspective.  She is dysarthric and her husband provides much of her history. She denies chest pain, palpitations, dyspnea, pnd, orthopnea, n, v, dizziness, syncope, edema, weight gain, or early satiety.     Past Medical History:  Diagnosis Date   Acute ischemic left MCA stroke (HCC) 11/05/2022   Acute pain of left shoulder 08/13/2017   Anxiety    Arthritis    Arthritis of carpometacarpal (CMC) joint of left thumb 06/21/2020   Bilateral dry eyes    Takes Systane Drops   Bronchitis    hx of   Cerebral edema (HCC) 11/01/2022   Cerebrovascular accident (CVA) due to occlusion  of left carotid artery (HCC) 10/31/2022   Chronic low back pain 04/15/2013   Constipation due to pain medication    Depression    Edema of extremities    Takes Lasix   Fibromyalgia    History of kidney stones    Hypercholesteremia    Knee pain, right 01/23/2014   Left carpal tunnel syndrome 06/21/2020   Lumbar radiculopathy 04/15/2013   Lumbosacral radiculitis 09/23/2013   Other specified postprocedural states 01/31/2013   Pain in thoracic spine 04/15/2013   PONV (postoperative nausea and vomiting)    PONV (postoperative nausea and vomiting) 02/07/2021   Prolapse of female pelvic organs 05/19/2017   Recurrent right knee instability 12/31/2017   Restless leg syndrome    Takes Klonopin   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   S/P total knee replacement 10/27/2022   Unilateral primary osteoarthritis, right knee 05/26/2013    Past Surgical History:  Procedure Laterality Date   ABDOMINAL HYSTERECTOMY     ANKLE FUSION Right    BACK SURGERY     x 6 lumbar & cervical   CARPAL TUNNEL RELEASE Bilateral    KNEE ARTHROSCOPY Bilateral    ROTATOR CUFF REPAIR Right    SPINAL CORD STIMULATOR REMOVAL N/A 07/29/2022   Procedure: REMOVAL OF SPINAL CORD STIMULATOR AND BATTERY;  Surgeon: Bethann Goo, DO;  Location: [facility] OR;  Service: Neurosurgery;  Laterality: N/A;  3C   TOTAL KNEE ARTHROPLASTY Left 07/25/2013   Procedure: TOTAL KNEE ARTHROPLASTY;  Surgeon:  Dannielle Huh, MD;  Location: [facility] OR;  Service: Orthopedics;  Laterality: Left;   TOTAL KNEE ARTHROPLASTY Right 10/27/2022   Procedure: TOTAL KNEE ARTHROPLASTY;  Surgeon: Dannielle Huh, MD;  Location: [facility];  Service: Orthopedics;  Laterality: Right;    Current Medications: Current Meds  Medication Sig   atorvastatin (LIPITOR) 10 MG tablet Take 10 mg by mouth every evening.   baclofen (LIORESAL) 10 MG tablet Take 10 mg by mouth 2 (two) times daily as needed for muscle spasms.   beta carotene w/minerals (OCUVITE) tablet Take 1 tablet by  mouth in the morning.   clonazePAM (KLONOPIN) 0.5 MG tablet Take 0.5 mg by mouth 2 (two) times daily as needed for anxiety.   Cyanocobalamin (B-12 PO) Take 1 tablet by mouth daily. 5000 mcg   ELIQUIS 5 MG TABS tablet Take 5 mg by mouth 2 (two) times daily.   ENDOCET 5-325 MG tablet Take 1 tablet by mouth 3 (three) times daily.   furosemide (LASIX) 20 MG tablet Take 20 mg by mouth in the morning.   metoprolol tartrate (LOPRESSOR) 25 MG tablet Take 25 mg by mouth 2 (two) times daily.   pregabalin (LYRICA) 100 MG capsule Take 100 mg by mouth at bedtime.   tiZANidine (ZANAFLEX) 4 MG tablet Take 4 mg by mouth in the morning and at bedtime.   venlafaxine (EFFEXOR) 75 MG tablet Take 75 mg by mouth 2 (two) times daily.     Allergies:   Pravachol [pravastatin sodium], Augmentin [amoxicillin-pot clavulanate], Benadryl allergy [diphenhydramine hcl], Clavulanic acid, and Codeine   Social History   Socioeconomic History   Marital status: Married    Spouse name: Not on file   Number of children: 3   Years of education: Not on file   Highest education level: Not on file  Occupational History   Not on file  Tobacco Use   Smoking status: Some Days    Packs/day: 0.25    Years: 29.00    Additional pack years: 0.00    Total pack years: 7.25    Types: Cigarettes   Smokeless tobacco: Never  Vaping Use   Vaping Use: Never used  Substance and Sexual Activity   Alcohol use: No   Drug use: Not Currently   Sexual activity: Not on file  Other Topics Concern   Not on file  Social History Narrative   Not on file   Social Determinants of Health   Financial Resource Strain: Not on file  Food Insecurity: No Food Insecurity (10/27/2022)   Hunger Vital Sign    Worried About Running Out of Food in the Last Year: Never true    Ran Out of Food in the Last Year: Never true  Transportation Needs: No Transportation Needs (10/27/2022)   PRAPARE - Administrator, Civil Service (Medical): No    Lack  of Transportation (Non-Medical): No  Physical Activity: Not on file  Stress: Not on file  Social Connections: Not on file     Family History: The patient's family history includes Cancer in her mother; Diabetes in her sister; Heart Problems in her father and sister.  ROS:   Please see the history of present illness.     All other systems reviewed and are negative.  EKGs/Labs/Other Studies Reviewed:    The following studies were reviewed today: Cardiac Studies & Procedures       ECHOCARDIOGRAM  ECHOCARDIOGRAM COMPLETE 03/19/2021  Narrative ECHOCARDIOGRAM REPORT    Patient Name:   EARLA DITTUS  Hillman Date of Exam: 03/18/2021 Medical Rec #:  [id number]     Height:       68.0 in Accession #:    [id number]    Weight:       190.8 lb Date of Birth:  January 31, 1956    BSA:          2.003 m Patient Age:    64 years      BP:           93/63 mmHg Patient Gender: F             HR:           61 bpm. Exam Location:  [place]  Procedure: 2D Echo  Indications:    Nonspecific abnormal electrocardiogram (ECG) (EKG) [R94.31 (ICD-10-CM)]  History:        Patient has no prior history of Echocardiogram examinations. Signs/Symptoms:Edema of extremities; Risk Factors:Dyslipidemia.  Sonographer:    Louie Boston Referring Phys: [phone] BRIAN J MUNLEY  IMPRESSIONS   1. Left ventricular ejection fraction, by estimation, is 60 to 65%. The left ventricle has normal function. The left ventricle has no regional wall motion abnormalities. Left ventricular diastolic parameters were normal. 2. Right ventricular systolic function is normal. The right ventricular size is normal. There is normal pulmonary artery systolic pressure. 3. The mitral valve is normal in structure. No evidence of mitral valve regurgitation. No evidence of mitral stenosis. 4. The aortic valve is normal in structure. Aortic valve regurgitation is not visualized. No aortic stenosis is present. 5. The inferior vena cava is normal in size with  greater than 50% respiratory variability, suggesting right atrial pressure of 3 mmHg.  FINDINGS Left Ventricle: Left ventricular ejection fraction, by estimation, is 60 to 65%. The left ventricle has normal function. The left ventricle has no regional wall motion abnormalities. The left ventricular internal cavity size was normal in size. There is no left ventricular hypertrophy. Left ventricular diastolic parameters were normal.  Right Ventricle: The right ventricular size is normal. No increase in right ventricular wall thickness. Right ventricular systolic function is normal. There is normal pulmonary artery systolic pressure. The tricuspid regurgitant velocity is 2.10 m/s, and with an assumed right atrial pressure of 3 mmHg, the estimated right ventricular systolic pressure is 20.6 mmHg.  Left [facility]: Left atrial size was normal in size.  Right [facility]: Right atrial size was normal in size.  Pericardium: There is no evidence of pericardial effusion.  Mitral Valve: The mitral valve is normal in structure. No evidence of mitral valve regurgitation. No evidence of mitral valve stenosis.  Tricuspid Valve: The tricuspid valve is normal in structure. Tricuspid valve regurgitation is mild . No evidence of tricuspid stenosis.  Aortic Valve: The aortic valve is normal in structure. Aortic valve regurgitation is not visualized. No aortic stenosis is present.  Pulmonic Valve: The pulmonic valve was normal in structure. Pulmonic valve regurgitation is not visualized. No evidence of pulmonic stenosis.  Aorta: The aortic root is normal in size and structure.  Venous: The inferior vena cava is normal in size with greater than 50% respiratory variability, suggesting right atrial pressure of 3 mmHg.  IAS/Shunts: No atrial level shunt detected by color flow Doppler.   LEFT VENTRICLE PLAX 2D LVIDd:         4.20 cm  Diastology LVIDs:         2.60 cm  LV e' medial:    7.94 cm/s LV PW:  1.00 cm   LV E/e' medial:  10.2 LV IVS:        1.00 cm  LV e' lateral:   12.10 cm/s LVOT diam:     1.90 cm  LV E/e' lateral: 6.7 LV SV:         54 LV SV Index:   27 LVOT Area:     2.84 cm   RIGHT VENTRICLE            IVC RV S prime:     9.57 cm/s  IVC diam: 1.60 cm TAPSE (M-mode): 2.2 cm  LEFT ATRIUM             Index       RIGHT ATRIUM           Index LA diam:        3.40 cm 1.70 cm/m  RA Area:     16.10 cm LA Vol (A2C):   37.8 ml 18.87 ml/m RA Volume:   41.20 ml  20.57 ml/m LA Vol (A4C):   34.2 ml 17.07 ml/m LA Biplane Vol: 37.1 ml 18.52 ml/m AORTIC VALVE LVOT Vmax:   73.10 cm/s LVOT Vmean:  54.800 cm/s LVOT VTI:    0.190 m  AORTA Ao Root diam: 2.80 cm Ao Asc diam:  3.00 cm Ao Desc diam: 2.40 cm  MITRAL VALVE               TRICUSPID VALVE MV Area (PHT): 5.84 cm    TR Peak grad:   17.6 mmHg MV Decel Time: 130 msec    TR Vmax:        210.00 cm/s MV E velocity: 81.00 cm/s MV A velocity: 72.40 cm/s  SHUNTS MV E/A ratio:  1.12        Systemic VTI:  0.19 m Systemic Diam: 1.90 cm  Gypsy Balsam MD Electronically signed by Gypsy Balsam MD Signature Date/Time: 03/19/2021/1:01:38 PM    Final              EKG:  EKG is  ordered today.  The ekg ordered today demonstrates sinus rhythm with PACs, heart rate 81 bpm.  Recent Labs: 10/27/2022: ALT 20; BUN 20; Creatinine, Ser 0.59; Hemoglobin 11.9; Platelets 185; Potassium 3.6; Sodium 141  Recent Lipid Panel No results found for: "CHOL", "TRIG", "HDL", "CHOLHDL", "VLDL", "LDLCALC", "LDLDIRECT"   Risk Assessment/Calculations:    CHA2DS2-VASc Score = 4   This indicates a 4.8% annual risk of stroke. The patient's score is based upon: CHF History: 0 HTN History: 0 Diabetes History: 0 Stroke History: 2 Vascular Disease History: 0 Age Score: 1 Gender Score: 1               Physical Exam:    VS:  BP 100/80 (BP Location: Left Arm, Patient Position: Sitting, Cuff Size: Normal)   Pulse 74   Ht 5\' 8"  (1.727 m)   Wt  145 lb (65.8 kg)   SpO2 96%   BMI 22.05 kg/m     Wt Readings from Last 3 Encounters:  01/27/23 145 lb (65.8 kg)  10/27/22 156 lb (70.8 kg)  10/15/22 156 lb (70.8 kg)     GEN:  Well nourished, well developed in no acute distress HEENT: Normal NECK: No JVD; No carotid bruits LYMPHATICS: No lymphadenopathy CARDIAC: RRR, no murmurs, rubs, gallops RESPIRATORY:  Clear to auscultation without rales, wheezing or rhonchi  ABDOMEN: Soft, non-tender, non-distended MUSCULOSKELETAL:  No edema; No deformity  SKIN: Warm and dry NEUROLOGIC:  Alert and  oriented x 3, dysarthric PSYCHIATRIC:  Normal affect   ASSESSMENT:    1. Acute ischemic left MCA stroke (HCC)   2. Cerebrovascular accident (CVA) due to occlusion of left carotid artery (HCC)   3. Mixed hyperlipidemia   4. Paroxysmal atrial fibrillation (HCC)    PLAN:    In order of problems listed above:  CVA-felt to be cardioembolism, she was s/p TKR 2 days prior to event, upon presented to the ED was noted to be in A-fib RVR.  Records from hospitalization at outside hospital were reviewed it appears she converted with Cardizem.  She is currently anticoagulated with Eliquis 5 mg twice daily. Paroxysmal atrial fibrillation-CHA2DS2-VASc score 4, she denies palpitations, she is in sinus rhythm today in office.  Will arrange for 2-week ZIO monitor to assess for atrial fibrillation burden.  Continue Eliquis 5 mg twice daily--no indication for dose reduction.  Continue metoprolol 25 mg twice daily. Hyperlipidemia-LDL on 11/01/2022 was well-controlled at 61, continue Lipitor 10 mg daily.     Disposition 14-day monitor for recurrence of atrial fibrillation. Return in 2 months.       Medication Adjustments/Labs and Tests Ordered: Current medicines are reviewed at length with the patient today.  Concerns regarding medicines are outlined above.  Orders Placed This Encounter  Procedures   LONG TERM MONITOR (3-14 DAYS)   EKG 12-Lead   No orders of  the defined types were placed in this encounter.   Patient Instructions  Medication Instructions:  Your physician recommends that you continue on your current medications as directed. Please refer to the Current Medication list given to you today.  *If you need a refill on your cardiac medications before your next appointment, please call your pharmacy*   Lab Work: None Ordered If you have labs (blood work) drawn today and your tests are completely normal, you will receive your results only by: MyChart Message (if you have MyChart) OR A paper copy in the mail If you have any lab test that is abnormal or we need to change your treatment, we will call you to review the results.   Testing/Procedures:  WHY IS MY DOCTOR PRESCRIBING ZIO? The Zio system is proven and trusted by physicians to detect and diagnose irregular heart rhythms -- and has been prescribed to hundreds of thousands of patients.  The FDA has cleared the Zio system to monitor for many different kinds of irregular heart rhythms. In a study, physicians were able to reach a diagnosis 90% of the time with the Zio system1.  You can wear the Zio monitor -- a small, discreet, comfortable patch -- during your normal day-to-day activity, including while you sleep, shower, and exercise, while it records every single heartbeat for analysis.  1Barrett, P., et al. Comparison of 24 Hour Holter Monitoring Versus 14 Day Novel Adhesive Patch Electrocardiographic Monitoring. American Journal of Medicine, 2014.  ZIO VS. HOLTER MONITORING The Zio monitor can be comfortably worn for up to 14 days. Holter monitors can be worn for 24 to 48 hours, limiting the time to record any irregular heart rhythms you may have. Zio is able to capture data for the 51% of patients who have their first symptom-triggered arrhythmia after 48 hours.1  LIVE WITHOUT RESTRICTIONS The Zio ambulatory cardiac monitor is a small, unobtrusive, and water-resistant  patch--you might even forget you're wearing it. The Zio monitor records and stores every beat of your heart, whether you're sleeping, working out, or showering.     Follow-Up: At [facility], you  and your health needs are our priority.  As part of our continuing mission to provide you with exceptional heart care, we have created designated Provider Care Teams.  These Care Teams include your primary Cardiologist (physician) and Advanced Practice Providers (APPs -  Physician Assistants and Nurse Practitioners) who all work together to provide you with the care you need, when you need it.  We recommend signing up for the patient portal called "MyChart".  Sign up information is provided on this After Visit Summary.  MyChart is used to connect with patients for Virtual Visits (Telemedicine).  Patients are able to view lab/test results, encounter notes, upcoming appointments, etc.  Non-urgent messages can be sent to your provider as well.   To learn more about what you can do with MyChart, go to [email or web address].    Your next appointment:   2 month(s)  The format for your next appointment:   In Person  Provider:   Gypsy Balsam, MD    Other Instructions NA    Signed, Flossie Dibble, NP  01/27/2023 4:36 PM    [facility]

## 2023-01-27 ENCOUNTER — Ambulatory Visit: Payer: Medicare Other | Attending: Cardiology

## 2023-01-27 ENCOUNTER — Encounter: Payer: Self-pay | Admitting: Cardiology

## 2023-01-27 ENCOUNTER — Ambulatory Visit: Payer: Medicare Other | Attending: Cardiology | Admitting: Cardiology

## 2023-01-27 VITALS — BP 100/80 | HR 74 | Ht 68.0 in | Wt 145.0 lb

## 2023-01-27 DIAGNOSIS — E782 Mixed hyperlipidemia: Secondary | ICD-10-CM

## 2023-01-27 DIAGNOSIS — I480 Paroxysmal atrial fibrillation: Secondary | ICD-10-CM | POA: Diagnosis not present

## 2023-01-27 DIAGNOSIS — I63512 Cerebral infarction due to unspecified occlusion or stenosis of left middle cerebral artery: Secondary | ICD-10-CM | POA: Diagnosis not present

## 2023-01-27 DIAGNOSIS — I63232 Cerebral infarction due to unspecified occlusion or stenosis of left carotid arteries: Secondary | ICD-10-CM

## 2023-01-27 NOTE — Patient Instructions (Addendum)
Medication Instructions:  Your physician recommends that you continue on your current medications as directed. Please refer to the Current Medication list given to you today.  *If you need a refill on your cardiac medications before your next appointment, please call your pharmacy*   Lab Work: None Ordered If you have labs (blood work) drawn today and your tests are completely normal, you will receive your results only by: MyChart Message (if you have MyChart) OR A paper copy in the mail If you have any lab test that is abnormal or we need to change your treatment, we will call you to review the results.   Testing/Procedures:  WHY IS MY DOCTOR PRESCRIBING ZIO? The Zio system is proven and trusted by physicians to detect and diagnose irregular heart rhythms -- and has been prescribed to hundreds of thousands of patients.  The FDA has cleared the Zio system to monitor for many different kinds of irregular heart rhythms. In a study, physicians were able to reach a diagnosis 90% of the time with the Zio system1.  You can wear the Zio monitor -- a small, discreet, comfortable patch -- during your normal day-to-day activity, including while you sleep, shower, and exercise, while it records every single heartbeat for analysis.  1Barrett, P., et al. Comparison of 24 Hour Holter Monitoring Versus 14 Day Novel Adhesive Patch Electrocardiographic Monitoring. American Journal of Medicine, 2014.  ZIO VS. HOLTER MONITORING The Zio monitor can be comfortably worn for up to 14 days. Holter monitors can be worn for 24 to 48 hours, limiting the time to record any irregular heart rhythms you may have. Zio is able to capture data for the 51% of patients who have their first symptom-triggered arrhythmia after 48 hours.1  LIVE WITHOUT RESTRICTIONS The Zio ambulatory cardiac monitor is a small, unobtrusive, and water-resistant patch--you might even forget you're wearing it. The Zio monitor records and stores  every beat of your heart, whether you're sleeping, working out, or showering.     Follow-Up: At [facility], you and your health needs are our priority.  As part of our continuing mission to provide you with exceptional heart care, we have created designated Provider Care Teams.  These Care Teams include your primary Cardiologist (physician) and Advanced Practice Providers (APPs -  Physician Assistants and Nurse Practitioners) who all work together to provide you with the care you need, when you need it.  We recommend signing up for the patient portal called "MyChart".  Sign up information is provided on this After Visit Summary.  MyChart is used to connect with patients for Virtual Visits (Telemedicine).  Patients are able to view lab/test results, encounter notes, upcoming appointments, etc.  Non-urgent messages can be sent to your provider as well.   To learn more about what you can do with MyChart, go to [email or web address].    Your next appointment:   2 month(s)  The format for your next appointment:   In Person  Provider:   Robert Krasowski, MD    Other Instructions NA  

## 2023-01-28 DIAGNOSIS — Z8673 Personal history of transient ischemic attack (TIA), and cerebral infarction without residual deficits: Secondary | ICD-10-CM | POA: Diagnosis not present

## 2023-01-28 DIAGNOSIS — I639 Cerebral infarction, unspecified: Secondary | ICD-10-CM | POA: Diagnosis not present

## 2023-01-28 DIAGNOSIS — I63512 Cerebral infarction due to unspecified occlusion or stenosis of left middle cerebral artery: Secondary | ICD-10-CM | POA: Diagnosis not present

## 2023-01-28 DIAGNOSIS — R4701 Aphasia: Secondary | ICD-10-CM | POA: Diagnosis not present

## 2023-01-29 DIAGNOSIS — M6281 Muscle weakness (generalized): Secondary | ICD-10-CM | POA: Diagnosis not present

## 2023-01-29 DIAGNOSIS — R482 Apraxia: Secondary | ICD-10-CM | POA: Diagnosis not present

## 2023-01-29 DIAGNOSIS — R278 Other lack of coordination: Secondary | ICD-10-CM | POA: Diagnosis not present

## 2023-01-29 DIAGNOSIS — H538 Other visual disturbances: Secondary | ICD-10-CM | POA: Diagnosis not present

## 2023-01-29 DIAGNOSIS — Z8673 Personal history of transient ischemic attack (TIA), and cerebral infarction without residual deficits: Secondary | ICD-10-CM | POA: Diagnosis not present

## 2023-02-03 DIAGNOSIS — Z8673 Personal history of transient ischemic attack (TIA), and cerebral infarction without residual deficits: Secondary | ICD-10-CM | POA: Diagnosis not present

## 2023-02-03 DIAGNOSIS — M6281 Muscle weakness (generalized): Secondary | ICD-10-CM | POA: Diagnosis not present

## 2023-02-03 DIAGNOSIS — R278 Other lack of coordination: Secondary | ICD-10-CM | POA: Diagnosis not present

## 2023-02-03 DIAGNOSIS — R482 Apraxia: Secondary | ICD-10-CM | POA: Diagnosis not present

## 2023-02-03 DIAGNOSIS — H538 Other visual disturbances: Secondary | ICD-10-CM | POA: Diagnosis not present

## 2023-02-10 DIAGNOSIS — M6281 Muscle weakness (generalized): Secondary | ICD-10-CM | POA: Diagnosis not present

## 2023-02-10 DIAGNOSIS — H538 Other visual disturbances: Secondary | ICD-10-CM | POA: Diagnosis not present

## 2023-02-10 DIAGNOSIS — Z8673 Personal history of transient ischemic attack (TIA), and cerebral infarction without residual deficits: Secondary | ICD-10-CM | POA: Diagnosis not present

## 2023-02-10 DIAGNOSIS — R482 Apraxia: Secondary | ICD-10-CM | POA: Diagnosis not present

## 2023-02-10 DIAGNOSIS — R278 Other lack of coordination: Secondary | ICD-10-CM | POA: Diagnosis not present

## 2023-02-14 DIAGNOSIS — I480 Paroxysmal atrial fibrillation: Secondary | ICD-10-CM | POA: Diagnosis not present

## 2023-02-17 DIAGNOSIS — M6281 Muscle weakness (generalized): Secondary | ICD-10-CM | POA: Diagnosis not present

## 2023-02-17 DIAGNOSIS — R278 Other lack of coordination: Secondary | ICD-10-CM | POA: Diagnosis not present

## 2023-02-17 DIAGNOSIS — Z8673 Personal history of transient ischemic attack (TIA), and cerebral infarction without residual deficits: Secondary | ICD-10-CM | POA: Diagnosis not present

## 2023-02-17 DIAGNOSIS — H538 Other visual disturbances: Secondary | ICD-10-CM | POA: Diagnosis not present

## 2023-02-17 DIAGNOSIS — R482 Apraxia: Secondary | ICD-10-CM | POA: Diagnosis not present

## 2023-02-19 ENCOUNTER — Telehealth: Payer: Self-pay | Admitting: *Deleted

## 2023-02-19 NOTE — Telephone Encounter (Signed)
Spoke with pt's husband Ron Risk analyst) and he stated she checks her BP and Heart Rate every day and if they see anything worrisome will let us know. No changes at this time.

## 2023-02-19 NOTE — Telephone Encounter (Signed)
-----   Message from Flossie Dibble, NP sent at 02/19/2023  9:10 AM EDT ----- Ms. Blackard, the results of your monitor revealed that you are in sinus rhythm, your average heart rate is 64 bpm.  No episodes of atrial fibrillation or flutter, no worrisome pauses.  You did have several episodes where your heart rate speeds up, which she likely would feel, and also a lot of extra beats occurring from the top part of your heart.  This is not worrisome, but you may feel it.  I know you are still recovering from your stroke and do not necessarily want to adjust any of your medications if this is not overly bothersome for you.  If it is bothersome, we could increase her metoprolol to 50 mg twice daily, otherwise you can discuss it with Dr. Dulce Sellar when you see him in August.  De Nurse

## 2023-02-19 NOTE — Telephone Encounter (Signed)
Left message for pt to call back  °

## 2023-02-24 DIAGNOSIS — I63512 Cerebral infarction due to unspecified occlusion or stenosis of left middle cerebral artery: Secondary | ICD-10-CM | POA: Diagnosis not present

## 2023-02-24 DIAGNOSIS — R4701 Aphasia: Secondary | ICD-10-CM | POA: Diagnosis not present

## 2023-03-03 DIAGNOSIS — R4701 Aphasia: Secondary | ICD-10-CM | POA: Diagnosis not present

## 2023-03-03 DIAGNOSIS — I63512 Cerebral infarction due to unspecified occlusion or stenosis of left middle cerebral artery: Secondary | ICD-10-CM | POA: Diagnosis not present

## 2023-03-10 DIAGNOSIS — R4701 Aphasia: Secondary | ICD-10-CM | POA: Diagnosis not present

## 2023-03-10 DIAGNOSIS — I63512 Cerebral infarction due to unspecified occlusion or stenosis of left middle cerebral artery: Secondary | ICD-10-CM | POA: Diagnosis not present

## 2023-03-17 DIAGNOSIS — R4701 Aphasia: Secondary | ICD-10-CM | POA: Diagnosis not present

## 2023-03-17 DIAGNOSIS — I63512 Cerebral infarction due to unspecified occlusion or stenosis of left middle cerebral artery: Secondary | ICD-10-CM | POA: Diagnosis not present

## 2023-03-24 DIAGNOSIS — R4701 Aphasia: Secondary | ICD-10-CM | POA: Diagnosis not present

## 2023-03-24 DIAGNOSIS — I63512 Cerebral infarction due to unspecified occlusion or stenosis of left middle cerebral artery: Secondary | ICD-10-CM | POA: Diagnosis not present

## 2023-04-07 DIAGNOSIS — R4701 Aphasia: Secondary | ICD-10-CM | POA: Diagnosis not present

## 2023-04-07 DIAGNOSIS — Z8673 Personal history of transient ischemic attack (TIA), and cerebral infarction without residual deficits: Secondary | ICD-10-CM | POA: Diagnosis not present

## 2023-04-14 DIAGNOSIS — R4701 Aphasia: Secondary | ICD-10-CM | POA: Diagnosis not present

## 2023-04-14 DIAGNOSIS — Z8673 Personal history of transient ischemic attack (TIA), and cerebral infarction without residual deficits: Secondary | ICD-10-CM | POA: Diagnosis not present

## 2023-04-15 NOTE — Progress Notes (Unsigned)
Cardiology Office Note:    Date:  04/16/2023   ID:  Sonya Allen, DOB 03/19/1956, MRN [id number]  PCP:  Philemon Kingdom, MD  Cardiologist:  Norman Herrlich, MD    Referring MD: Philemon Kingdom, MD    ASSESSMENT:    1. Paroxysmal atrial fibrillation (HCC)   2. PAT (paroxysmal atrial tachycardia)   3. Cerebrovascular accident (CVA) due to occlusion of left carotid artery (HCC)   4. Mixed hyperlipidemia    PLAN:    In order of problems listed above:  Fortunately maintaining sinus rhythm not on antiarrhythmic drug continue her anticoagulant with stroke and her beta-blocker Continue statin   Next appointment: 6 months   Medication Adjustments/Labs and Tests Ordered: Current medicines are reviewed at length with the patient today.  Concerns regarding medicines are outlined above.  No orders of the defined types were placed in this encounter.  No orders of the defined types were placed in this encounter.    History of Present Illness:    Sonya Allen is a 67 y.o. female with a hx of previous abnormal EKG with right atrial enlargement lower extremity edema and more recently paroxysmal atrial tachycardia last seen by me 02/20/2021 and most recently by Wallis Bamberg nurse practitioner in the office.  She had an echocardiogram performed  The echocardiogram 03/18/2021 showed both the left and right atria are normal in size right ventricle normal size and function artery pressures left ventricle normal size function systolic and diastolic and no valvular abnormality  She had an event monitor reported 02/18/2023 showing frequent supraventricular ectopy 77 episodes of atrial tachycardia the longest episode 10 seconds rate of 174 bpm.  Compliance with diet, lifestyle and medications: Yes  I reviewed her medication she is not taking aspirin and is taking her anticoagulant She continues in speech therapy Her monitor showed no atrial fibrillation but frequent episodes of SVT on a  beta-blocker and is not having symptoms of palpitation I encouraged her and her husband to purchase a Fitbit to monitor for high low rates and also screen her heart rhythm I do not think she requires an antiarrhythmic drug or other suppressant treatment She is on lipid-lowering therapy with a statin following stroke Past Medical History:  Diagnosis Date   Acute ischemic left MCA stroke (HCC) 11/05/2022   Acute pain of left shoulder 08/13/2017   Anxiety    Arthritis    Arthritis of carpometacarpal (CMC) joint of left thumb 06/21/2020   Bilateral dry eyes    Takes Systane Drops   Bronchitis    hx of   Cerebral edema (HCC) 11/01/2022   Cerebrovascular accident (CVA) due to occlusion of left carotid artery (HCC) 10/31/2022   Chronic low back pain 04/15/2013   Constipation due to pain medication    Depression    Edema of extremities    Takes Lasix   Fibromyalgia    History of kidney stones    Hypercholesteremia    Knee pain, right 01/23/2014   Left carpal tunnel syndrome 06/21/2020   Lumbar radiculopathy 04/15/2013   Lumbosacral radiculitis 09/23/2013   Other specified postprocedural states 01/31/2013   Pain in thoracic spine 04/15/2013   PONV (postoperative nausea and vomiting)    PONV (postoperative nausea and vomiting) 02/07/2021   Prolapse of female pelvic organs 05/19/2017   Recurrent right knee instability 12/31/2017   Restless leg syndrome    Takes Klonopin   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   baclofen (LIORESAL) 10 MG tablet Take 10 mg by mouth 2 (two) times daily as needed for muscle spasms.   beta carotene w/minerals (OCUVITE) tablet Take 1 tablet by mouth in the morning.   clonazePAM (KLONOPIN) 1 MG tablet Take 1 mg by mouth at bedtime  as needed.   Cyanocobalamin (B-12 PO) Take 1 tablet by mouth daily. 5000 mcg   ELIQUIS 5 MG TABS tablet Take 5 mg by mouth 2 (two) times daily.   ENDOCET 5-325 MG tablet Take 1 tablet by mouth 3 (three) times daily.   furosemide (LASIX) 20 MG tablet Take 20 mg by mouth in the morning.   metoprolol tartrate (LOPRESSOR) 25 MG tablet Take 25 mg by mouth 2 (two) times daily.   pregabalin (LYRICA) 100 MG capsule Take 100 mg by mouth at bedtime.   tiZANidine (ZANAFLEX) 4 MG tablet Take 4 mg by mouth in the morning and at bedtime.   venlafaxine (EFFEXOR) 75 MG tablet Take 75 mg by mouth 2 (two) times daily.   [DISCONTINUED] clonazePAM (KLONOPIN) 0.5 MG tablet Take 0.5 mg by mouth 2 (two) times daily as needed for anxiety.      EKGs/Labs/Other Studies Reviewed:    The following studies were reviewed today:  Cardiac Studies & Procedures       ECHOCARDIOGRAM  ECHOCARDIOGRAM COMPLETE 03/18/2021  Narrative ECHOCARDIOGRAM REPORT    Patient Name:   Sonya Allen Date of Exam: 03/18/2021 Medical Rec #:  [id number]     Height:       68.0 in Accession #:    [id number]    Weight:       190.8 lb Date of Birth:  1956/07/28    BSA:          2.003 m Patient Age:    64 years      BP:           93/63 mmHg Patient Gender: F             HR:           61 bpm. Exam Location:  [place]  Procedure: 2D Echo  Indications:    Nonspecific abnormal electrocardiogram (ECG) (EKG) [R94.31 (ICD-10-CM)]  History:        Patient has no prior history of Echocardiogram examinations. Signs/Symptoms:Edema of extremities; Risk Factors:Dyslipidemia.  Sonographer:    Louie Boston Referring Phys: [id number] Kimmie Doren J Nedda Gains  IMPRESSIONS   1. Left ventricular ejection fraction, by estimation, is 60 to 65%. The left ventricle has normal function. The left ventricle has no regional wall motion abnormalities. Left ventricular diastolic parameters were normal. 2. Right ventricular systolic function is normal. The right  ventricular size is normal. There is normal pulmonary artery systolic pressure. 3. The mitral valve is normal in structure. No evidence of mitral valve regurgitation. No evidence of mitral stenosis. 4. The aortic valve is normal in structure. Aortic valve regurgitation is not visualized. No aortic stenosis is present. 5. The inferior vena cava is normal in size with greater than 50% respiratory variability, suggesting right atrial pressure of 3 mmHg.  FINDINGS Left Ventricle: Left ventricular ejection fraction, by estimation, is 60 to 65%. The left ventricle has normal function. The left ventricle has no regional wall motion abnormalities. The left ventricular internal cavity size was normal in size. There is  no left ventricular hypertrophy. Left ventricular diastolic parameters were normal.  Right Ventricle: The right ventricular size is normal. No increase in right ventricular wall thickness. Right ventricular systolic function is normal. There is normal pulmonary artery systolic pressure. The tricuspid regurgitant velocity is 2.10 m/s, and with an assumed right atrial pressure of 3 mmHg, the estimated right ventricular systolic pressure is 20.6 mmHg.  Left Atrium: Left atrial size was normal in size.  Right Atrium: Right atrial size was normal in size.  Pericardium: There is no evidence of pericardial effusion.  Mitral Valve: The mitral valve is normal in structure. No evidence of mitral valve regurgitation. No evidence of mitral valve stenosis.  Tricuspid Valve: The tricuspid valve is normal in structure. Tricuspid valve regurgitation is mild . No evidence of tricuspid stenosis.  Aortic Valve: The aortic valve is normal in structure. Aortic valve regurgitation is not visualized. No aortic stenosis is present.  Pulmonic Valve: The pulmonic valve was normal in structure. Pulmonic valve regurgitation is not visualized. No evidence of pulmonic stenosis.  Aorta: The aortic root is normal in  size and structure.  Venous: The inferior vena cava is normal in size with greater than 50% respiratory variability, suggesting right atrial pressure of 3 mmHg.  IAS/Shunts: No atrial level shunt detected by color flow Doppler.   LEFT VENTRICLE PLAX 2D LVIDd:         4.20 cm  Diastology LVIDs:         2.60 cm  LV e' medial:    7.94 cm/s LV PW:         1.00 cm  LV E/e' medial:  10.2 LV IVS:        1.00 cm  LV e' lateral:   12.10 cm/s LVOT diam:     1.90 cm  LV E/e' lateral: 6.7 LV SV:         54 LV SV Index:   27 LVOT Area:     2.84 cm   RIGHT VENTRICLE            IVC RV S prime:     9.57 cm/s  IVC diam: 1.60 cm TAPSE (M-mode): 2.2 cm  LEFT ATRIUM             Index       RIGHT ATRIUM           Index LA diam:        3.40 cm 1.70 cm/m  RA Area:     16.10 cm LA Vol (A2C):   37.8 ml 18.87 ml/m RA Volume:   41.20 ml  20.57 ml/m LA Vol (A4C):   34.2 ml 17.07 ml/m LA Biplane Vol: 37.1 ml 18.52 ml/m AORTIC VALVE LVOT Vmax:   73.10 cm/s LVOT Vmean:  54.800 cm/s LVOT VTI:    0.190 m  AORTA Ao Root diam: 2.80 cm Ao Asc diam:  3.00 cm Ao Desc diam: 2.40 cm  MITRAL VALVE               TRICUSPID VALVE MV Area (PHT): 5.84 cm    TR Peak grad:   17.6 mmHg MV Decel Time: 130 msec    TR Vmax:        210.00 cm/s MV E velocity: 81.00 cm/s MV A velocity: 72.40 cm/s  SHUNTS MV E/A ratio:  1.12        Systemic VTI:  0.19 m Systemic Diam: 1.90 cm  Gypsy Balsam MD Electronically signed by Gypsy Balsam MD Signature Date/Time:  03/19/2021/1:01:38 PM    Final    MONITORS  LONG TERM MONITOR (3-14 DAYS) 02/18/2023  Narrative Patch Wear Time:  13 days and 17 hours (2024-06-04T14:59:47-0400 to 2024-06-18T08:56:54-0400)  Patient had a min HR of 46 bpm, max HR of 174 bpm, and avg HR of 64 bpm. Predominant underlying rhythm was Sinus Rhythm.  There were no triggered or diary events.  No no sinus pauses 3 seconds or greater and no episodes of second or third-degree AV nodal  block.  There were no episodes of atrial fibrillation or flutter.  77 Supraventricular Tachycardia/PAT runs occurred, the run with the fastest interval lasting 10.4 secs with a max rate of 174 bpm (avg 145 bpm); the run with the fastest interval was also the longest.  Isolated SVEs were frequent (6.0%, [id number]), SVE Couplets were occasional (2.1%, [id number]), and SVE Triplets were occasional (2.5%, [id number]).  Isolated VEs were rare (<1.0%, 60), VE Triplets were rare (<1.0%, 1), and no VE Couplets were present.               Recent Labs: 10/27/2022: ALT 20; BUN 20; Creatinine, Ser 0.59; Hemoglobin 11.9; Platelets 185; Potassium 3.6; Sodium 141  Recent Lipid Panel No results found for: "CHOL", "TRIG", "HDL", "CHOLHDL", "VLDL", "LDLCALC", "LDLDIRECT"  Physical Exam:    VS:  BP (!) 80/60 (BP Location: Right Arm, Patient Position: Sitting, Cuff Size: Normal)   Pulse (!) 52   Ht 5\' 8"  (1.727 m)   Wt 137 lb (62.1 kg)   SpO2 98%   BMI 20.83 kg/m     Wt Readings from Last 3 Encounters:  04/16/23 137 lb (62.1 kg)  01/27/23 145 lb (65.8 kg)  10/27/22 156 lb (70.8 kg)     GEN:  Well nourished, well developed in no acute distress HEENT: Normal NECK: No JVD; No carotid bruits LYMPHATICS: No lymphadenopathy CARDIAC: RRR, no murmurs, rubs, gallops RESPIRATORY:  Clear to auscultation without rales, wheezing or rhonchi  ABDOMEN: Soft, non-tender, non-distended MUSCULOSKELETAL:  No edema; No deformity  SKIN: Warm and dry NEUROLOGIC:  Alert and oriented x 3 PSYCHIATRIC:  Normal affect    Signed, Norman Herrlich, MD  04/16/2023 3:29 PM    [facility]

## 2023-04-16 ENCOUNTER — Encounter: Payer: Self-pay | Admitting: Cardiology

## 2023-04-16 ENCOUNTER — Ambulatory Visit: Payer: Medicare Other | Attending: Cardiology | Admitting: Cardiology

## 2023-04-16 VITALS — BP 80/58 | HR 52 | Ht 68.0 in | Wt 137.0 lb

## 2023-04-16 DIAGNOSIS — E782 Mixed hyperlipidemia: Secondary | ICD-10-CM | POA: Diagnosis not present

## 2023-04-16 DIAGNOSIS — I4719 Other supraventricular tachycardia: Secondary | ICD-10-CM

## 2023-04-16 DIAGNOSIS — I63232 Cerebral infarction due to unspecified occlusion or stenosis of left carotid arteries: Secondary | ICD-10-CM

## 2023-04-16 DIAGNOSIS — I480 Paroxysmal atrial fibrillation: Secondary | ICD-10-CM | POA: Diagnosis not present

## 2023-04-16 NOTE — Patient Instructions (Signed)
Medication Instructions:  Your physician recommends that you continue on your current medications as directed. Please refer to the Current Medication list given to you today.  *If you need a refill on your cardiac medications before your next appointment, please call your pharmacy*   Lab Work: NONE If you have labs (blood work) drawn today and your tests are completely normal, you will receive your results only by: MyChart Message (if you have MyChart) OR A paper copy in the mail If you have any lab test that is abnormal or we need to change your treatment, we will call you to review the results.   Testing/Procedures: NONE   Follow-Up: At [facility], you and your health needs are our priority.  As part of our continuing mission to provide you with exceptional heart care, we have created designated Provider Care Teams.  These Care Teams include your primary Cardiologist (physician) and Advanced Practice Providers (APPs -  Physician Assistants and Nurse Practitioners) who all work together to provide you with the care you need, when you need it.  We recommend signing up for the patient portal called "MyChart".  Sign up information is provided on this After Visit Summary.  MyChart is used to connect with patients for Virtual Visits (Telemedicine).  Patients are able to view lab/test results, encounter notes, upcoming appointments, etc.  Non-urgent messages can be sent to your provider as well.   To learn more about what you can do with MyChart, go to [email or web address].    Your next appointment:   6 month(s)  Provider:   Norman Herrlich, MD    Other Instructions Get and use FitBit Check BP daily

## 2023-04-16 NOTE — Addendum Note (Signed)
Addended by: Roxanne Mins I on: 04/16/2023 04:03 PM   Modules accepted: Orders

## 2023-04-17 ENCOUNTER — Telehealth: Payer: Self-pay

## 2023-04-17 DIAGNOSIS — I4719 Other supraventricular tachycardia: Secondary | ICD-10-CM | POA: Diagnosis not present

## 2023-04-17 DIAGNOSIS — E782 Mixed hyperlipidemia: Secondary | ICD-10-CM | POA: Diagnosis not present

## 2023-04-17 DIAGNOSIS — I480 Paroxysmal atrial fibrillation: Secondary | ICD-10-CM | POA: Diagnosis not present

## 2023-04-17 DIAGNOSIS — I63232 Cerebral infarction due to unspecified occlusion or stenosis of left carotid arteries: Secondary | ICD-10-CM | POA: Diagnosis not present

## 2023-04-17 NOTE — Telephone Encounter (Signed)
Patient left her office visit on 04/16/23 without having lab work drawn. Called patient to inform her that Dr. Dulce Sellar would like her to have labs drawn. Patient did not answer the phone and a message was left for her to call the office back.

## 2023-04-18 LAB — COMPREHENSIVE METABOLIC PANEL
ALT: 10 IU/L (ref 0–32)
AST: 17 IU/L (ref 0–40)
Albumin: 4.3 g/dL (ref 3.9–4.9)
Alkaline Phosphatase: 79 IU/L (ref 44–121)
BUN/Creatinine Ratio: 20 (ref 12–28)
BUN: 13 mg/dL (ref 8–27)
Bilirubin Total: 0.3 mg/dL (ref 0.0–1.2)
CO2: 27 mmol/L (ref 20–29)
Calcium: 8.9 mg/dL (ref 8.7–10.3)
Chloride: 105 mmol/L (ref 96–106)
Creatinine, Ser: 0.64 mg/dL (ref 0.57–1.00)
Globulin, Total: 1.7 g/dL (ref 1.5–4.5)
Glucose: 87 mg/dL (ref 70–99)
Potassium: 4.6 mmol/L (ref 3.5–5.2)
Sodium: 145 mmol/L — ABNORMAL HIGH (ref 134–144)
Total Protein: 6 g/dL (ref 6.0–8.5)
eGFR: 97 mL/min/{1.73_m2} (ref >59)

## 2023-04-18 LAB — CBC
Hematocrit: 34.7 % (ref 34.0–46.6)
Hemoglobin: 11.2 g/dL (ref 11.1–15.9)
MCH: 30.9 pg (ref 26.6–33.0)
MCHC: 32.3 g/dL (ref 31.5–35.7)
MCV: 96 fL (ref 79–97)
Platelets: 174 10*3/uL (ref 150–450)
RBC: 3.62 x10E6/uL — ABNORMAL LOW (ref 3.77–5.28)
RDW: 13.8 % (ref 11.7–15.4)
WBC: 5.9 10*3/uL (ref 3.4–10.8)

## 2023-04-20 ENCOUNTER — Telehealth: Payer: Self-pay | Admitting: Cardiology

## 2023-04-20 NOTE — Telephone Encounter (Signed)
Patient's husband informed of results.

## 2023-04-20 NOTE — Telephone Encounter (Signed)
Husband Windy Fast) called to follow-up on patient's lab results.

## 2023-04-28 DIAGNOSIS — Z7189 Other specified counseling: Secondary | ICD-10-CM | POA: Diagnosis not present

## 2023-04-28 DIAGNOSIS — D6859 Other primary thrombophilia: Secondary | ICD-10-CM | POA: Diagnosis not present

## 2023-04-28 DIAGNOSIS — Z6821 Body mass index (BMI) 21.0-21.9, adult: Secondary | ICD-10-CM | POA: Diagnosis not present

## 2023-04-28 DIAGNOSIS — E785 Hyperlipidemia, unspecified: Secondary | ICD-10-CM | POA: Diagnosis not present

## 2023-04-28 DIAGNOSIS — Z8673 Personal history of transient ischemic attack (TIA), and cerebral infarction without residual deficits: Secondary | ICD-10-CM | POA: Diagnosis not present

## 2023-04-28 DIAGNOSIS — R4701 Aphasia: Secondary | ICD-10-CM | POA: Diagnosis not present

## 2023-04-28 DIAGNOSIS — I480 Paroxysmal atrial fibrillation: Secondary | ICD-10-CM | POA: Diagnosis not present

## 2023-04-28 DIAGNOSIS — M47816 Spondylosis without myelopathy or radiculopathy, lumbar region: Secondary | ICD-10-CM | POA: Diagnosis not present

## 2023-04-28 DIAGNOSIS — Z79899 Other long term (current) drug therapy: Secondary | ICD-10-CM | POA: Diagnosis not present

## 2023-04-28 DIAGNOSIS — Z0000 Encounter for general adult medical examination without abnormal findings: Secondary | ICD-10-CM | POA: Diagnosis not present

## 2023-04-28 DIAGNOSIS — Z1231 Encounter for screening mammogram for malignant neoplasm of breast: Secondary | ICD-10-CM | POA: Diagnosis not present

## 2023-05-05 DIAGNOSIS — R4701 Aphasia: Secondary | ICD-10-CM | POA: Diagnosis not present

## 2023-05-05 DIAGNOSIS — Z8673 Personal history of transient ischemic attack (TIA), and cerebral infarction without residual deficits: Secondary | ICD-10-CM | POA: Diagnosis not present

## 2023-05-12 DIAGNOSIS — Z8673 Personal history of transient ischemic attack (TIA), and cerebral infarction without residual deficits: Secondary | ICD-10-CM | POA: Diagnosis not present

## 2023-05-12 DIAGNOSIS — R4701 Aphasia: Secondary | ICD-10-CM | POA: Diagnosis not present

## 2023-05-19 DIAGNOSIS — R4701 Aphasia: Secondary | ICD-10-CM | POA: Diagnosis not present

## 2023-05-19 DIAGNOSIS — Z8673 Personal history of transient ischemic attack (TIA), and cerebral infarction without residual deficits: Secondary | ICD-10-CM | POA: Diagnosis not present

## 2023-05-21 DIAGNOSIS — Z1231 Encounter for screening mammogram for malignant neoplasm of breast: Secondary | ICD-10-CM | POA: Diagnosis not present

## 2023-05-26 DIAGNOSIS — R4701 Aphasia: Secondary | ICD-10-CM | POA: Diagnosis not present

## 2023-05-26 DIAGNOSIS — Z8673 Personal history of transient ischemic attack (TIA), and cerebral infarction without residual deficits: Secondary | ICD-10-CM | POA: Diagnosis not present

## 2023-06-02 DIAGNOSIS — R4701 Aphasia: Secondary | ICD-10-CM | POA: Diagnosis not present

## 2023-06-02 DIAGNOSIS — Z8673 Personal history of transient ischemic attack (TIA), and cerebral infarction without residual deficits: Secondary | ICD-10-CM | POA: Diagnosis not present

## 2023-06-16 DIAGNOSIS — Z8673 Personal history of transient ischemic attack (TIA), and cerebral infarction without residual deficits: Secondary | ICD-10-CM | POA: Diagnosis not present

## 2023-06-16 DIAGNOSIS — R4701 Aphasia: Secondary | ICD-10-CM | POA: Diagnosis not present

## 2023-06-30 DIAGNOSIS — R4701 Aphasia: Secondary | ICD-10-CM | POA: Diagnosis not present

## 2023-06-30 DIAGNOSIS — Z8673 Personal history of transient ischemic attack (TIA), and cerebral infarction without residual deficits: Secondary | ICD-10-CM | POA: Diagnosis not present

## 2023-07-07 DIAGNOSIS — Z8673 Personal history of transient ischemic attack (TIA), and cerebral infarction without residual deficits: Secondary | ICD-10-CM | POA: Diagnosis not present

## 2023-07-07 DIAGNOSIS — R4701 Aphasia: Secondary | ICD-10-CM | POA: Diagnosis not present

## 2023-07-14 DIAGNOSIS — Z8673 Personal history of transient ischemic attack (TIA), and cerebral infarction without residual deficits: Secondary | ICD-10-CM | POA: Diagnosis not present

## 2023-07-14 DIAGNOSIS — R4701 Aphasia: Secondary | ICD-10-CM | POA: Diagnosis not present

## 2023-07-28 DIAGNOSIS — Z8673 Personal history of transient ischemic attack (TIA), and cerebral infarction without residual deficits: Secondary | ICD-10-CM | POA: Diagnosis not present

## 2023-07-28 DIAGNOSIS — M47816 Spondylosis without myelopathy or radiculopathy, lumbar region: Secondary | ICD-10-CM | POA: Diagnosis not present

## 2023-07-28 DIAGNOSIS — I480 Paroxysmal atrial fibrillation: Secondary | ICD-10-CM | POA: Diagnosis not present

## 2023-07-28 DIAGNOSIS — R4701 Aphasia: Secondary | ICD-10-CM | POA: Diagnosis not present

## 2023-07-28 DIAGNOSIS — E785 Hyperlipidemia, unspecified: Secondary | ICD-10-CM | POA: Diagnosis not present

## 2023-07-28 DIAGNOSIS — Z6821 Body mass index (BMI) 21.0-21.9, adult: Secondary | ICD-10-CM | POA: Diagnosis not present

## 2023-08-11 DIAGNOSIS — R4701 Aphasia: Secondary | ICD-10-CM | POA: Diagnosis not present

## 2023-08-11 DIAGNOSIS — Z8673 Personal history of transient ischemic attack (TIA), and cerebral infarction without residual deficits: Secondary | ICD-10-CM | POA: Diagnosis not present

## 2023-09-01 DIAGNOSIS — Z8673 Personal history of transient ischemic attack (TIA), and cerebral infarction without residual deficits: Secondary | ICD-10-CM | POA: Diagnosis not present

## 2023-09-01 DIAGNOSIS — R4701 Aphasia: Secondary | ICD-10-CM | POA: Diagnosis not present

## 2023-10-13 DIAGNOSIS — Z8673 Personal history of transient ischemic attack (TIA), and cerebral infarction without residual deficits: Secondary | ICD-10-CM | POA: Diagnosis not present

## 2023-10-13 DIAGNOSIS — R4701 Aphasia: Secondary | ICD-10-CM | POA: Diagnosis not present

## 2023-10-15 ENCOUNTER — Other Ambulatory Visit: Payer: Self-pay

## 2023-10-15 NOTE — Progress Notes (Signed)
 Cardiology Office Note:    Date:  10/19/2023   ID:  Sonya Allen, DOB Jan 09, 1956, MRN [id number]  PCP:  Sonya Kingdom, MD  Cardiologist:  Sonya Herrlich, MD    Referring MD: Sonya Kingdom, MD    ASSESSMENT:    1. Paroxysmal atrial fibrillation (HCC)   2. Chronic anticoagulation   3. PAT (paroxysmal atrial tachycardia) (HCC)   4. Mixed hyperlipidemia    PLAN:    In order of problems listed above:  Clinically she has had no recurrent atrial fibrillation quiring antiarrhythmic drug she will continue her current low-dose beta-blocker raters and anticoagulant Xarelto full dose without bleeding complication Stable atrial arrhythmia continue her beta-blocker Following stroke she will continue her statin labs are followed in her PCP office   Next appointment: 1 year   Medication Adjustments/Labs and Tests Ordered: Current medicines are reviewed at length with the patient today.  Concerns regarding medicines are outlined above.  No orders of the defined types were placed in this encounter.  No orders of the defined types were placed in this encounter.    History of Present Illness:    Sonya Allen is a 68 y.o. female with a hx of paroxysmal atrial fibrillation and paroxysmal atrial tachycardia history of stroke with chronic anticoagulation and hyperlipidemia last seen 04/16/2023.   Compliance with diet, lifestyle and medications: Yes  Her spouse is present participates in the evaluation decision making she continues to have difficulty finding words that seems to be improved She has had no bleeding complication her anticoagulant and no muscle pain or weakness with her statin. Home blood pressure tends to run in the range of 100/70 No lightheadedness edema shortness of breath chest pain or syncope Past Medical History:  Diagnosis Date   Acute ischemic left MCA stroke (HCC) 11/05/2022   Acute pain of left shoulder 08/13/2017   Anxiety    Aphasia due to acute  cerebrovascular accident (CVA) (HCC) 11/24/2022   Arthritis    Arthritis of carpometacarpal (CMC) joint of left thumb 06/21/2020   Bilateral dry eyes    Takes Systane Drops   Bronchitis    hx of   Cerebral edema (HCC) 11/01/2022   Cerebrovascular accident (CVA) due to occlusion of left carotid artery (HCC) 10/31/2022   Chronic low back pain 04/15/2013   Constipation due to pain medication    Depression    Edema of extremities    Takes Lasix   Fibromyalgia    History of kidney stones    Hypercholesteremia    Knee pain, right 01/23/2014   Left carpal tunnel syndrome 06/21/2020   Lumbar radiculopathy 04/15/2013   Lumbosacral radiculitis 09/23/2013   Macrocytic anemia 11/24/2022   Other specified postprocedural states 01/31/2013   Pain in thoracic spine 04/15/2013   PONV (postoperative nausea and vomiting)    PONV (postoperative nausea and vomiting) 02/07/2021   Prolapse of female pelvic organs 05/19/2017   Recurrent right knee instability 12/31/2017   Restless leg syndrome    Takes Klonopin   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   baclofen (LIORESAL) 10 MG tablet Take 10 mg by mouth 2 (two) times daily as needed for muscle spasms.   beta carotene w/minerals (OCUVITE) tablet Take 1 tablet by mouth in  the morning.   clonazePAM (KLONOPIN) 1 MG tablet Take 1 mg by mouth at bedtime as needed for anxiety.   Cyanocobalamin (B-12 PO) Take 1 tablet by mouth daily. 5000 mcg   furosemide (LASIX) 20 MG tablet Take 20 mg by mouth in the morning.   metoprolol tartrate (LOPRESSOR) 25 MG tablet Take 25 mg by mouth 2 (two) times daily.   oxyCODONE-acetaminophen (PERCOCET) 10-325 MG tablet Take 1 tablet by mouth 3 (three) times  daily as needed.   pregabalin (LYRICA) 100 MG capsule Take 100 mg by mouth at bedtime.   tiZANidine (ZANAFLEX) 4 MG tablet Take 4 mg by mouth in the morning and at bedtime.   venlafaxine (EFFEXOR) 75 MG tablet Take 75 mg by mouth 2 (two) times daily.   XARELTO 20 MG TABS tablet Take 20 mg by mouth daily.      EKGs/Labs/Other Studies Reviewed:    The following studies were reviewed today:  Cardiac Studies & Procedures   ______________________________________________________________________________________________     ECHOCARDIOGRAM  ECHOCARDIOGRAM COMPLETE 03/18/2021  Narrative ECHOCARDIOGRAM REPORT    Patient Name:   Sonya Allen Date of Exam: 03/18/2021 Medical Rec #:  [id number]     Height:       68.0 in Accession #:    [id number]    Weight:       190.8 lb Date of Birth:  August 07, 1956    BSA:          2.003 m Patient Age:    64 years      BP:           93/63 mmHg Patient Gender: F             HR:           61 bpm. Exam Location:  [facility]  Procedure: 2D Echo  Indications:    Nonspecific abnormal electrocardiogram (ECG) (EKG) [R94.31 (ICD-10-CM)]  History:        Patient has no prior history of Echocardiogram examinations. Signs/Symptoms:Edema of extremities; Risk Factors:Dyslipidemia.  Sonographer:    Sonya Allen Referring Phys: [phone] Sonya Allen Sonya Allen  IMPRESSIONS   1. Left ventricular ejection fraction, by estimation, is 60 to 65%. The left ventricle has normal function. The left ventricle has no regional wall motion abnormalities. Left ventricular diastolic parameters were normal. 2. Right ventricular systolic function is normal. The right ventricular size is normal. There is normal pulmonary artery systolic pressure. 3. The mitral valve is normal in structure. No evidence of mitral valve regurgitation. No evidence of mitral stenosis. 4. The aortic valve is normal in structure. Aortic valve regurgitation is not visualized. No aortic stenosis is present. 5. The  inferior vena cava is normal in size with greater than 50% respiratory variability, suggesting right atrial pressure of 3 mmHg.  FINDINGS Left Ventricle: Left ventricular ejection fraction, by estimation, is 60 to 65%. The left ventricle has normal function. The left ventricle has no regional wall motion abnormalities. The left ventricular internal cavity size was normal in size. There is no left ventricular hypertrophy. Left ventricular diastolic parameters were normal.  Right Ventricle: The right ventricular size is normal. No increase in right ventricular wall thickness. Right ventricular systolic function is normal. There is normal pulmonary artery systolic pressure. The tricuspid regurgitant velocity is 2.10 m/s, and with an assumed right atrial pressure of 3 mmHg, the estimated right ventricular systolic pressure is 20.6 mmHg.  Left Atrium: Left atrial size was normal in size.  Right Atrium: Right atrial size  was normal in size.  Pericardium: There is no evidence of pericardial effusion.  Mitral Valve: The mitral valve is normal in structure. No evidence of mitral valve regurgitation. No evidence of mitral valve stenosis.  Tricuspid Valve: The tricuspid valve is normal in structure. Tricuspid valve regurgitation is mild . No evidence of tricuspid stenosis.  Aortic Valve: The aortic valve is normal in structure. Aortic valve regurgitation is not visualized. No aortic stenosis is present.  Pulmonic Valve: The pulmonic valve was normal in structure. Pulmonic valve regurgitation is not visualized. No evidence of pulmonic stenosis.  Aorta: The aortic root is normal in size and structure.  Venous: The inferior vena cava is normal in size with greater than 50% respiratory variability, suggesting right atrial pressure of 3 mmHg.  IAS/Shunts: No atrial level shunt detected by color flow Doppler.   LEFT VENTRICLE PLAX 2D LVIDd:         4.20 cm  Diastology LVIDs:         2.60 cm  LV e'  medial:    7.94 cm/s LV PW:         1.00 cm  LV E/e' medial:  10.2 LV IVS:        1.00 cm  LV e' lateral:   12.10 cm/s LVOT diam:     1.90 cm  LV E/e' lateral: 6.7 LV SV:         54 LV SV Index:   27 LVOT Area:     2.84 cm   RIGHT VENTRICLE            IVC RV S prime:     9.57 cm/s  IVC diam: 1.60 cm TAPSE (M-mode): 2.2 cm  LEFT ATRIUM             Index       RIGHT ATRIUM           Index LA diam:        3.40 cm 1.70 cm/m  RA Area:     16.10 cm LA Vol (A2C):   37.8 ml 18.87 ml/m RA Volume:   41.20 ml  20.57 ml/m LA Vol (A4C):   34.2 ml 17.07 ml/m LA Biplane Vol: 37.1 ml 18.52 ml/m AORTIC VALVE LVOT Vmax:   73.10 cm/s LVOT Vmean:  54.800 cm/s LVOT VTI:    0.190 m  AORTA Ao Root diam: 2.80 cm Ao Asc diam:  3.00 cm Ao Desc diam: 2.40 cm  MITRAL VALVE               TRICUSPID VALVE MV Area (PHT): 5.84 cm    TR Peak grad:   17.6 mmHg MV Decel Time: 130 msec    TR Vmax:        210.00 cm/s MV E velocity: 81.00 cm/s MV A velocity: 72.40 cm/s  SHUNTS MV E/A ratio:  1.12        Systemic VTI:  0.19 m Systemic Diam: 1.90 cm  Gypsy Balsam MD Electronically signed by Gypsy Balsam MD Signature Date/Time: 03/19/2021/1:01:38 PM    Final    MONITORS  LONG TERM MONITOR (3-14 DAYS) 02/18/2023  Narrative Patch Wear Time:  13 days and 17 hours (2024-06-04T14:59:47-0400 to 2024-06-18T08:56:54-0400)  Patient had a min HR of 46 bpm, max HR of 174 bpm, and avg HR of 64 bpm. Predominant underlying rhythm was Sinus Rhythm.  There were no triggered or diary events.  No no sinus pauses 3 seconds or greater and no episodes of second or third-degree  AV nodal block.  There were no episodes of atrial fibrillation or flutter.  77 Supraventricular Tachycardia/PAT runs occurred, the run with the fastest interval lasting 10.4 secs with a max rate of 174 bpm (avg 145 bpm); the run with the fastest interval was also the longest.  Isolated SVEs were frequent (6.0%, [id number]), SVE  Couplets were occasional (2.1%, 13312), and SVE Triplets were occasional (2.5%, 10723).  Isolated VEs were rare (<1.0%, 60), VE Triplets were rare (<1.0%, 1), and no VE Couplets were present.       ______________________________________________________________________________________________          Recent Labs: 04/17/2023: ALT 10; BUN 13; Creatinine, Ser 0.64; Hemoglobin 11.2; Platelets 174; Potassium 4.6; Sodium 145  Recent Lipid Panel 09/18/2022 cholesterol 157 LDL 99  Physical Exam:    VS:  BP 96/65   Pulse 69   Ht 5\' 8"  (1.727 m)   Wt 134 lb (60.8 kg)   SpO2 98%   BMI 20.37 kg/m     Wt Readings from Last 3 Encounters:  10/19/23 134 lb (60.8 kg)  04/16/23 137 lb (62.1 kg)  01/27/23 145 lb (65.8 kg)     GEN:  Well nourished, well developed in no acute distress HEENT: Normal NECK: No JVD; No carotid bruits LYMPHATICS: No lymphadenopathy CARDIAC: RRR, no murmurs, rubs, gallops RESPIRATORY:  Clear to auscultation without rales, wheezing or rhonchi  ABDOMEN: Soft, non-tender, non-distended MUSCULOSKELETAL:  No edema; No deformity  SKIN: Warm and dry NEUROLOGIC:  Alert and oriented x 3 PSYCHIATRIC:  Normal affect    Signed, Sonya Herrlich, MD  10/19/2023 2:55 PM    [facility]

## 2023-10-19 ENCOUNTER — Encounter: Payer: Self-pay | Admitting: Cardiology

## 2023-10-19 ENCOUNTER — Ambulatory Visit: Payer: HMO | Attending: Cardiology | Admitting: Cardiology

## 2023-10-19 VITALS — BP 96/65 | HR 69 | Ht 68.0 in | Wt 134.0 lb

## 2023-10-19 DIAGNOSIS — I480 Paroxysmal atrial fibrillation: Secondary | ICD-10-CM

## 2023-10-19 DIAGNOSIS — I4719 Other supraventricular tachycardia: Secondary | ICD-10-CM

## 2023-10-19 DIAGNOSIS — Z7901 Long term (current) use of anticoagulants: Secondary | ICD-10-CM

## 2023-10-19 DIAGNOSIS — E782 Mixed hyperlipidemia: Secondary | ICD-10-CM | POA: Diagnosis not present

## 2023-10-19 NOTE — Patient Instructions (Signed)
 Medication Instructions:  Your physician recommends that you continue on your current medications as directed. Please refer to the Current Medication list given to you today.  *If you need a refill on your cardiac medications before your next appointment, please call your pharmacy*   Lab Work: None If you have labs (blood work) drawn today and your tests are completely normal, you will receive your results only by: MyChart Message (if you have MyChart) OR A paper copy in the mail If you have any lab test that is abnormal or we need to change your treatment, we will call you to review the results.   Testing/Procedures: None   Follow-Up: At [facility], you and your health needs are our priority.  As part of our continuing mission to provide you with exceptional heart care, we have created designated Provider Care Teams.  These Care Teams include your primary Cardiologist (physician) and Advanced Practice Providers (APPs -  Physician Assistants and Nurse Practitioners) who all work together to provide you with the care you need, when you need it.  We recommend signing up for the patient portal called "MyChart".  Sign up information is provided on this After Visit Summary.  MyChart is used to connect with patients for Virtual Visits (Telemedicine).  Patients are able to view lab/test results, encounter notes, upcoming appointments, etc.  Non-urgent messages can be sent to your provider as well.   To learn more about what you can do with MyChart, go to [email or web address].    Your next appointment:   1 year(s)  Provider:   Norman Herrlich, MD    Other Instructions None

## 2023-10-26 DIAGNOSIS — Z5181 Encounter for therapeutic drug level monitoring: Secondary | ICD-10-CM | POA: Diagnosis not present

## 2023-10-26 DIAGNOSIS — E785 Hyperlipidemia, unspecified: Secondary | ICD-10-CM | POA: Diagnosis not present

## 2023-10-26 DIAGNOSIS — Z6821 Body mass index (BMI) 21.0-21.9, adult: Secondary | ICD-10-CM | POA: Diagnosis not present

## 2023-10-26 DIAGNOSIS — F3342 Major depressive disorder, recurrent, in full remission: Secondary | ICD-10-CM | POA: Diagnosis not present

## 2023-10-26 DIAGNOSIS — Z79891 Long term (current) use of opiate analgesic: Secondary | ICD-10-CM | POA: Diagnosis not present

## 2023-10-26 DIAGNOSIS — F5101 Primary insomnia: Secondary | ICD-10-CM | POA: Diagnosis not present

## 2023-10-26 DIAGNOSIS — Z79899 Other long term (current) drug therapy: Secondary | ICD-10-CM | POA: Diagnosis not present

## 2023-10-26 DIAGNOSIS — N390 Urinary tract infection, site not specified: Secondary | ICD-10-CM | POA: Diagnosis not present

## 2023-10-26 DIAGNOSIS — D6859 Other primary thrombophilia: Secondary | ICD-10-CM | POA: Diagnosis not present

## 2023-10-26 DIAGNOSIS — I480 Paroxysmal atrial fibrillation: Secondary | ICD-10-CM | POA: Diagnosis not present

## 2023-10-27 DIAGNOSIS — R4701 Aphasia: Secondary | ICD-10-CM | POA: Diagnosis not present

## 2023-10-27 DIAGNOSIS — Z8673 Personal history of transient ischemic attack (TIA), and cerebral infarction without residual deficits: Secondary | ICD-10-CM | POA: Diagnosis not present

## 2023-11-12 DIAGNOSIS — Z96651 Presence of right artificial knee joint: Secondary | ICD-10-CM | POA: Diagnosis not present

## 2024-02-01 DIAGNOSIS — I480 Paroxysmal atrial fibrillation: Secondary | ICD-10-CM | POA: Diagnosis not present

## 2024-02-01 DIAGNOSIS — Z6821 Body mass index (BMI) 21.0-21.9, adult: Secondary | ICD-10-CM | POA: Diagnosis not present

## 2024-02-01 DIAGNOSIS — F5101 Primary insomnia: Secondary | ICD-10-CM | POA: Diagnosis not present

## 2024-02-01 DIAGNOSIS — E785 Hyperlipidemia, unspecified: Secondary | ICD-10-CM | POA: Diagnosis not present

## 2024-02-01 DIAGNOSIS — F3342 Major depressive disorder, recurrent, in full remission: Secondary | ICD-10-CM | POA: Diagnosis not present

## 2024-02-01 DIAGNOSIS — M47816 Spondylosis without myelopathy or radiculopathy, lumbar region: Secondary | ICD-10-CM | POA: Diagnosis not present

## 2024-03-10 DIAGNOSIS — I69320 Aphasia following cerebral infarction: Secondary | ICD-10-CM | POA: Diagnosis not present

## 2024-03-23 DIAGNOSIS — I63512 Cerebral infarction due to unspecified occlusion or stenosis of left middle cerebral artery: Secondary | ICD-10-CM | POA: Diagnosis not present

## 2024-03-23 DIAGNOSIS — R479 Unspecified speech disturbances: Secondary | ICD-10-CM | POA: Diagnosis not present

## 2024-03-23 DIAGNOSIS — M542 Cervicalgia: Secondary | ICD-10-CM | POA: Diagnosis not present

## 2024-03-23 DIAGNOSIS — R9082 White matter disease, unspecified: Secondary | ICD-10-CM | POA: Diagnosis not present

## 2024-05-04 DIAGNOSIS — Z79899 Other long term (current) drug therapy: Secondary | ICD-10-CM | POA: Diagnosis not present

## 2024-05-04 DIAGNOSIS — Z0000 Encounter for general adult medical examination without abnormal findings: Secondary | ICD-10-CM | POA: Diagnosis not present

## 2024-05-04 DIAGNOSIS — I480 Paroxysmal atrial fibrillation: Secondary | ICD-10-CM | POA: Diagnosis not present

## 2024-05-04 DIAGNOSIS — Z1231 Encounter for screening mammogram for malignant neoplasm of breast: Secondary | ICD-10-CM | POA: Diagnosis not present

## 2024-05-04 DIAGNOSIS — Z7189 Other specified counseling: Secondary | ICD-10-CM | POA: Diagnosis not present

## 2024-05-04 DIAGNOSIS — Z1211 Encounter for screening for malignant neoplasm of colon: Secondary | ICD-10-CM | POA: Diagnosis not present

## 2024-05-04 DIAGNOSIS — Z6821 Body mass index (BMI) 21.0-21.9, adult: Secondary | ICD-10-CM | POA: Diagnosis not present

## 2024-05-04 DIAGNOSIS — E2839 Other primary ovarian failure: Secondary | ICD-10-CM | POA: Diagnosis not present

## 2024-05-04 DIAGNOSIS — M47816 Spondylosis without myelopathy or radiculopathy, lumbar region: Secondary | ICD-10-CM | POA: Diagnosis not present

## 2024-05-04 DIAGNOSIS — E785 Hyperlipidemia, unspecified: Secondary | ICD-10-CM | POA: Diagnosis not present

## 2024-05-04 DIAGNOSIS — E559 Vitamin D deficiency, unspecified: Secondary | ICD-10-CM | POA: Diagnosis not present

## 2024-05-04 DIAGNOSIS — Z1331 Encounter for screening for depression: Secondary | ICD-10-CM | POA: Diagnosis not present

## 2024-06-23 ENCOUNTER — Ambulatory Visit (INDEPENDENT_AMBULATORY_CARE_PROVIDER_SITE_OTHER)

## 2024-06-23 ENCOUNTER — Ambulatory Visit: Admitting: Podiatry

## 2024-06-23 DIAGNOSIS — M79674 Pain in right toe(s): Secondary | ICD-10-CM | POA: Diagnosis not present

## 2024-06-23 DIAGNOSIS — M7751 Other enthesopathy of right foot: Secondary | ICD-10-CM

## 2024-06-23 DIAGNOSIS — M778 Other enthesopathies, not elsewhere classified: Secondary | ICD-10-CM | POA: Diagnosis not present

## 2024-06-23 DIAGNOSIS — Z7901 Long term (current) use of anticoagulants: Secondary | ICD-10-CM

## 2024-06-23 DIAGNOSIS — I739 Peripheral vascular disease, unspecified: Secondary | ICD-10-CM

## 2024-06-23 DIAGNOSIS — B351 Tinea unguium: Secondary | ICD-10-CM | POA: Diagnosis not present

## 2024-06-23 DIAGNOSIS — M79675 Pain in left toe(s): Secondary | ICD-10-CM

## 2024-06-23 DIAGNOSIS — M25671 Stiffness of right ankle, not elsewhere classified: Secondary | ICD-10-CM

## 2024-06-23 DIAGNOSIS — L84 Corns and callosities: Secondary | ICD-10-CM | POA: Diagnosis not present

## 2024-06-23 NOTE — Progress Notes (Signed)
 Subjective:  Patient ID: Sonya Allen, female    DOB: 1956/08/13,  MRN: [id number]  LENNYN GANGE presents to clinic today for:  Chief Complaint  Patient presents with   [facility]    RFC with callous Not diabetic and takes Xarelto.    Patient notes nails are thick and elongated, causing pain in shoe gear when ambulating.  She has painful calluses right submet 1 and 2, and left submet 1.  She suffered a stroke 2 days after a total knee replacement.  She stated that she was not immediately placed on any anticoagulant after the surgery, but is now on Xarelto since experiencing the stroke.  She does have some speech deficits, but otherwise notes that she is doing pretty well.  She has underwent a previous ankle fusion on the right with intramedullary rod as well as first tarsometatarsal arthrodesis in the past.  PCP is Jefferey Fitch, MD. last seen 03/10/2024  Past Medical History:  Diagnosis Date   Acute ischemic left MCA stroke (HCC) 11/05/2022   Acute pain of left shoulder 08/13/2017   Anxiety    Aphasia due to acute cerebrovascular accident (CVA) (HCC) 11/24/2022   Arthritis    Arthritis of carpometacarpal (CMC) joint of left thumb 06/21/2020   Bilateral dry eyes    Takes Systane Drops   Bronchitis    hx of   Cerebral edema (HCC) 11/01/2022   Cerebrovascular accident (CVA) due to occlusion of left carotid artery (HCC) 10/31/2022   Chronic low back pain 04/15/2013   Constipation due to pain medication    Depression    Edema of extremities    Takes Lasix    Fibromyalgia    History of kidney stones    Hypercholesteremia    Knee pain, right 01/23/2014   Left carpal tunnel syndrome 06/21/2020   Lumbar radiculopathy 04/15/2013   Lumbosacral radiculitis 09/23/2013   Macrocytic anemia 11/24/2022   Other specified postprocedural states 01/31/2013   Pain in thoracic spine 04/15/2013   PONV (postoperative nausea and vomiting)    PONV (postoperative nausea and vomiting) 02/07/2021    Prolapse of female pelvic organs 05/19/2017   Recurrent right knee instability 12/31/2017   Restless leg syndrome    Takes Klonopin    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   S/P total knee replacement 10/27/2022   Subclinical hyperthyroidism 11/24/2022   Unilateral primary osteoarthritis, right knee 05/26/2013   Allergies  Allergen Reactions   Pravachol [Pravastatin Sodium] Other (See Comments)    Muscles  aches     Augmentin [Amoxicillin-Pot Clavulanate] Diarrhea   Benadryl  Allergy [Diphenhydramine  Hcl] Other (See Comments)    leg jerking/restless legs   Clavulanic Acid    Codeine Nausea Only    Patient tolerates percocet (oxycodone -acetaminophen ) with no issue    Objective:  GALADRIEL SHROFF is a pleasant 68 y.o. female in NAD. AAO x 3.  Vascular Examination: Patient has palpable DP pulse, absent PT pulse bilateral.  Delayed capillary refill bilateral toes.  Sparse digital hair bilateral.  Proximal to distal cooling WNL bilateral.    Dermatological Examination: Interspaces are clear with no open lesions noted bilateral.  Skin is shiny and atrophic bilateral.  Nails are 3-75mm thick, with yellowish/brown discoloration, subungual debris and distal onycholysis x10.  There is pain with compression of nails x10.  There are hyperkeratotic lesions noted right submet 1, right submet 2, and left submet 1.  Radiographic examination (right foot, 3 weightbearing views, 06/23/2024): Decreased osseous mineralization to the foot.  There is a intramedullary rod extending from the calcaneus into the tibia.  There is some spurring noted plantar to the calcaneus at the head of the intramedullary rod.  There are 2 cancellous screws crossing the first metatarsal-medial cuneiform joint.  There is increased met adductus angle.  No fracture seen.  Patient qualifies for at-risk foot care because of PVD.  Assessment/Plan: 1. Callus of foot   2. Capsulitis of right foot   3. Pain due to onychomycosis  of toenails of both feet   4. PVD (peripheral vascular disease)   5. Stiffness of ankle joint, right   6. Long term current use of anticoagulant     Discussed clinical and radiographic findings with patient and her husband today.  Mycotic nails x10 were sharply debrided with sterile nail nippers and power debriding burr to decrease bulk and length.  Hyperkeratotic lesions x 3 were shaved with #312 blade.  These lesions were proximal to the toes.  Return in about 3 months (around 09/23/2024) for RFC.   Awanda CHARM Imperial, DPM, FACFAS [facility]     [address] [place], [place] [address]                Office [phone]  Fax [phone]

## 2024-07-08 DIAGNOSIS — M810 Age-related osteoporosis without current pathological fracture: Secondary | ICD-10-CM | POA: Diagnosis not present

## 2024-07-08 DIAGNOSIS — Z1231 Encounter for screening mammogram for malignant neoplasm of breast: Secondary | ICD-10-CM | POA: Diagnosis not present

## 2024-07-08 DIAGNOSIS — E2839 Other primary ovarian failure: Secondary | ICD-10-CM | POA: Diagnosis not present

## 2024-07-11 DIAGNOSIS — Z13820 Encounter for screening for osteoporosis: Secondary | ICD-10-CM | POA: Diagnosis not present

## 2024-07-11 DIAGNOSIS — E2839 Other primary ovarian failure: Secondary | ICD-10-CM | POA: Diagnosis not present

## 2024-08-03 DIAGNOSIS — M810 Age-related osteoporosis without current pathological fracture: Secondary | ICD-10-CM | POA: Diagnosis not present

## 2024-08-03 DIAGNOSIS — E785 Hyperlipidemia, unspecified: Secondary | ICD-10-CM | POA: Diagnosis not present

## 2024-08-03 DIAGNOSIS — Z6822 Body mass index (BMI) 22.0-22.9, adult: Secondary | ICD-10-CM | POA: Diagnosis not present

## 2024-08-03 DIAGNOSIS — I480 Paroxysmal atrial fibrillation: Secondary | ICD-10-CM | POA: Diagnosis not present

## 2024-08-03 DIAGNOSIS — M47816 Spondylosis without myelopathy or radiculopathy, lumbar region: Secondary | ICD-10-CM | POA: Diagnosis not present

## 2024-08-03 DIAGNOSIS — H02843 Edema of right eye, unspecified eyelid: Secondary | ICD-10-CM | POA: Diagnosis not present

## 2024-09-22 ENCOUNTER — Ambulatory Visit: Admitting: Podiatry

## 2024-09-22 DIAGNOSIS — M79675 Pain in left toe(s): Secondary | ICD-10-CM

## 2024-09-22 DIAGNOSIS — B351 Tinea unguium: Secondary | ICD-10-CM | POA: Diagnosis not present

## 2024-09-22 DIAGNOSIS — M79674 Pain in right toe(s): Secondary | ICD-10-CM

## 2024-09-22 DIAGNOSIS — I739 Peripheral vascular disease, unspecified: Secondary | ICD-10-CM | POA: Diagnosis not present

## 2024-09-22 DIAGNOSIS — L84 Corns and callosities: Secondary | ICD-10-CM

## 2024-09-22 NOTE — Progress Notes (Signed)
 "      Subjective:  Patient ID: Sonya Allen, female    DOB: May 11, 1956,  MRN: [id number]  Sonya Allen presents to clinic today for:  Chief Complaint  Patient presents with   [facility]    RFC with callus, Not diabetic and takes Xarelto   Patient notes nails are thick and elongated, causing pain in shoe gear when ambulating.  She has painful calluses bilateral submet 1 and right submet 2  PCP is Jefferey Fitch, MD. date last seen was 08/03/2024  Past Medical History:  Diagnosis Date   Acute ischemic left MCA stroke (HCC) 11/05/2022   Acute pain of left shoulder 08/13/2017   Anxiety    Aphasia due to acute cerebrovascular accident (CVA) (HCC) 11/24/2022   Arthritis    Arthritis of carpometacarpal (CMC) joint of left thumb 06/21/2020   Bilateral dry eyes    Takes Systane Drops   Bronchitis    hx of   Cerebral edema (HCC) 11/01/2022   Cerebrovascular accident (CVA) due to occlusion of left carotid artery (HCC) 10/31/2022   Chronic low back pain 04/15/2013   Constipation due to pain medication    Depression    Edema of extremities    Takes Lasix    Fibromyalgia    History of kidney stones    Hypercholesteremia    Knee pain, right 01/23/2014   Left carpal tunnel syndrome 06/21/2020   Lumbar radiculopathy 04/15/2013   Lumbosacral radiculitis 09/23/2013   Macrocytic anemia 11/24/2022   Other specified postprocedural states 01/31/2013   Pain in thoracic spine 04/15/2013   PONV (postoperative nausea and vomiting)    PONV (postoperative nausea and vomiting) 02/07/2021   Prolapse of female pelvic organs 05/19/2017   Recurrent right knee instability 12/31/2017   Restless leg syndrome    Takes Klonopin    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   S/P total knee replacement 10/27/2022   Subclinical hyperthyroidism 11/24/2022   Unilateral primary osteoarthritis, right knee 05/26/2013   Allergies[1]  Objective:  Sonya Allen is a pleasant 69 y.o. female in NAD. AAO x  3.  Vascular Examination: Patient has palpable DP pulse, absent PT pulse bilateral.  Delayed capillary refill bilateral toes.  Sparse digital hair bilateral.  Proximal to distal cooling WNL bilateral.    Dermatological Examination: Interspaces are clear with no open lesions noted bilateral.  Skin is shiny and atrophic bilateral.  Nails are 3-3mm thick, with yellowish/brown discoloration, subungual debris and distal onycholysis x10.  There is pain with compression of nails x10.  There are hyperkeratotic lesions noted bilateral submet 1 and right submet 2.  Patient qualifies for at-risk foot care because of PVD.  Assessment/Plan: 1. Pain due to onychomycosis of toenails of both feet   2. Callus of foot   3. PVD (peripheral vascular disease)    Mycotic nails x10 were sharply debrided with sterile nail nippers and power debriding burr to decrease bulk and length.  Hyperkeratotic lesions x 3 were shaved with #312 blade.  The locations are listed above in the dermatological examination and all are proximal to the toes  Return in about 3 months (around 12/21/2024) for RFC.   Sonya Allen, DPM, FACFAS [facility]     [address]                                        [place],  [place] [address]                Office [phone]  Fax [phone]    [1]  Allergies Allergen Reactions   Pravachol [Pravastatin Sodium] Other (See Comments)    Muscles  aches     Augmentin [Amoxicillin-Pot Clavulanate] Diarrhea   Benadryl  Allergy [Diphenhydramine  Hcl] Other (See Comments)    leg jerking/restless legs   Clavulanic Acid    Codeine Nausea Only    Patient tolerates percocet (oxycodone -acetaminophen ) with no issue   "

## 2024-12-22 ENCOUNTER — Ambulatory Visit: Admitting: Podiatry
